# Patient Record
Sex: Male | Born: 2000 | Race: Black or African American | Hispanic: No | Marital: Single | Smoking: Never smoker
Health system: Southern US, Community
[De-identification: ages and names within clinical notes are randomized; demographics above are authoritative.]

## PROBLEM LIST (undated history)

## (undated) ENCOUNTER — Emergency Department (HOSPITAL_COMMUNITY): Payer: Medicaid Other

## (undated) DIAGNOSIS — D571 Sickle-cell disease without crisis: Secondary | ICD-10-CM

---
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
## 2020-01-21 ENCOUNTER — Inpatient Hospital Stay (HOSPITAL_COMMUNITY)
Admission: EM | Admit: 2020-01-21 | Discharge: 2020-01-23 | DRG: 812 | Disposition: A | Discharge status: Home / Self Care | Payer: Medicaid Other | Attending: Internal Medicine | Admitting: Internal Medicine

## 2020-01-21 ENCOUNTER — Emergency Department (HOSPITAL_COMMUNITY): Payer: Medicaid Other

## 2020-01-21 ENCOUNTER — Encounter (HOSPITAL_COMMUNITY): Payer: Self-pay | Admitting: Emergency Medicine

## 2020-01-21 ENCOUNTER — Other Ambulatory Visit: Payer: Self-pay

## 2020-01-21 DIAGNOSIS — Z833 Family history of diabetes mellitus: Secondary | ICD-10-CM | POA: Diagnosis not present

## 2020-01-21 DIAGNOSIS — Z20822 Contact with and (suspected) exposure to covid-19: Secondary | ICD-10-CM | POA: Diagnosis not present

## 2020-01-21 DIAGNOSIS — R0789 Other chest pain: Secondary | ICD-10-CM | POA: Diagnosis present

## 2020-01-21 DIAGNOSIS — D638 Anemia in other chronic diseases classified elsewhere: Secondary | ICD-10-CM | POA: Diagnosis present

## 2020-01-21 DIAGNOSIS — D5700 Hb-SS disease with crisis, unspecified: Principal | ICD-10-CM | POA: Diagnosis present

## 2020-01-21 DIAGNOSIS — Z79899 Other long term (current) drug therapy: Secondary | ICD-10-CM

## 2020-01-21 DIAGNOSIS — K2900 Acute gastritis without bleeding: Secondary | ICD-10-CM | POA: Diagnosis not present

## 2020-01-21 DIAGNOSIS — Z832 Family history of diseases of the blood and blood-forming organs and certain disorders involving the immune mechanism: Secondary | ICD-10-CM | POA: Diagnosis not present

## 2020-01-21 DIAGNOSIS — D72829 Elevated white blood cell count, unspecified: Secondary | ICD-10-CM | POA: Diagnosis not present

## 2020-01-21 DIAGNOSIS — R079 Chest pain, unspecified: Secondary | ICD-10-CM

## 2020-01-21 DIAGNOSIS — G894 Chronic pain syndrome: Secondary | ICD-10-CM | POA: Diagnosis present

## 2020-01-21 LAB — COMPREHENSIVE METABOLIC PANEL
ALT: 14 U/L (ref 0–44)
AST: 31 U/L (ref 15–41)
Albumin: 4.2 g/dL (ref 3.5–5.0)
Alkaline Phosphatase: 103 U/L (ref 38–126)
Anion gap: 9 (ref 5–15)
BUN: 16 mg/dL (ref 6–20)
CO2: 23 mmol/L (ref 22–32)
Calcium: 9 mg/dL (ref 8.9–10.3)
Chloride: 107 mmol/L (ref 98–111)
Creatinine, Ser: 0.83 mg/dL (ref 0.61–1.24)
GFR calc Af Amer: >60 mL/min (ref >60)
GFR calc non Af Amer: >60 mL/min (ref >60)
Glucose, Bld: 100 mg/dL — ABNORMAL HIGH (ref 70–99)
Potassium: 3.9 mmol/L (ref 3.5–5.1)
Sodium: 139 mmol/L (ref 135–145)
Total Bilirubin: 4.4 mg/dL — ABNORMAL HIGH (ref 0.3–1.2)
Total Protein: 7.7 g/dL (ref 6.5–8.1)

## 2020-01-21 LAB — CBC WITH DIFFERENTIAL/PLATELET
Abs Immature Granulocytes: 0.22 10*3/uL — ABNORMAL HIGH (ref 0.00–0.07)
Basophils Absolute: 0.2 10*3/uL — ABNORMAL HIGH (ref 0.0–0.1)
Basophils Relative: 1 %
Eosinophils Absolute: 0.2 10*3/uL (ref 0.0–0.5)
Eosinophils Relative: 1 %
HCT: 26.9 % — ABNORMAL LOW (ref 39.0–52.0)
Hemoglobin: 9.7 g/dL — ABNORMAL LOW (ref 13.0–17.0)
Immature Granulocytes: 1 %
Lymphocytes Relative: 18 %
Lymphs Abs: 2.9 10*3/uL (ref 0.7–4.0)
MCH: 32.1 pg (ref 26.0–34.0)
MCHC: 36.1 g/dL — ABNORMAL HIGH (ref 30.0–36.0)
MCV: 89.1 fL (ref 80.0–100.0)
Monocytes Absolute: 2.4 10*3/uL — ABNORMAL HIGH (ref 0.1–1.0)
Monocytes Relative: 15 %
Neutro Abs: 10 10*3/uL — ABNORMAL HIGH (ref 1.7–7.7)
Neutrophils Relative %: 64 %
Platelets: 472 10*3/uL — ABNORMAL HIGH (ref 150–400)
RBC: 3.02 MIL/uL — ABNORMAL LOW (ref 4.22–5.81)
RDW: 21.5 % — ABNORMAL HIGH (ref 11.5–15.5)
WBC: 15.8 10*3/uL — ABNORMAL HIGH (ref 4.0–10.5)
nRBC: 3.9 % — ABNORMAL HIGH (ref 0.0–0.2)

## 2020-01-21 LAB — RETICULOCYTES
Immature Retic Fract: 34.1 % — ABNORMAL HIGH (ref 2.3–15.9)
RBC.: 2.99 MIL/uL — ABNORMAL LOW (ref 4.22–5.81)
Retic Count, Absolute: 414.5 10*3/uL — ABNORMAL HIGH (ref 19.0–186.0)
Retic Ct Pct: 13.6 % — ABNORMAL HIGH (ref 0.4–3.1)

## 2020-01-21 LAB — LIPASE, BLOOD: Lipase: 26 U/L (ref 11–51)

## 2020-01-21 LAB — SARS CORONAVIRUS 2 BY RT PCR (HOSPITAL ORDER, PERFORMED IN ~~LOC~~ HOSPITAL LAB): SARS Coronavirus 2: NEGATIVE

## 2020-01-21 MED ORDER — POLYETHYLENE GLYCOL 3350 17 G PO PACK
17.0000 g | PACK | Freq: Every day | ORAL | Status: DC | PRN
  Administered 2020-01-23: 17 g via ORAL
  Filled 2020-01-21: qty 1

## 2020-01-21 MED ORDER — HYDROXYUREA 500 MG PO CAPS
1500.0000 mg | ORAL_CAPSULE | Freq: Every day | ORAL | Status: DC
  Administered 2020-01-21 – 2020-01-22 (×2): 1500 mg via ORAL
  Filled 2020-01-21 (×2): qty 3

## 2020-01-21 MED ORDER — ENOXAPARIN SODIUM 40 MG/0.4ML ~~LOC~~ SOLN
40.0000 mg | SUBCUTANEOUS | Status: DC
  Administered 2020-01-21: 40 mg via SUBCUTANEOUS
  Filled 2020-01-21 (×2): qty 0.4

## 2020-01-21 MED ORDER — KETOROLAC TROMETHAMINE 15 MG/ML IJ SOLN
15.0000 mg | INTRAMUSCULAR | Status: AC
  Administered 2020-01-21: 15 mg via INTRAVENOUS
  Filled 2020-01-21: qty 1

## 2020-01-21 MED ORDER — SENNOSIDES-DOCUSATE SODIUM 8.6-50 MG PO TABS
1.0000 | ORAL_TABLET | Freq: Two times a day (BID) | ORAL | Status: DC
  Administered 2020-01-21 – 2020-01-23 (×4): 1 via ORAL
  Filled 2020-01-21 (×4): qty 1

## 2020-01-21 MED ORDER — HYDROMORPHONE HCL 1 MG/ML IJ SOLN
1.0000 mg | Freq: Once | INTRAMUSCULAR | Status: DC
  Filled 2020-01-21: qty 1

## 2020-01-21 MED ORDER — SODIUM CHLORIDE 0.9% FLUSH: 9.0000 mL | INTRAVENOUS | Status: DC | PRN

## 2020-01-21 MED ORDER — HYDROMORPHONE HCL 1 MG/ML IJ SOLN
0.5000 mg | Freq: Once | INTRAMUSCULAR | Status: AC
  Administered 2020-01-21: 0.5 mg via INTRAVENOUS
  Filled 2020-01-21: qty 1

## 2020-01-21 MED ORDER — DEXTROSE-NACL 5-0.45 % IV SOLN: INTRAVENOUS | Status: DC

## 2020-01-21 MED ORDER — NALOXONE HCL 0.4 MG/ML IJ SOLN: 0.4000 mg | INTRAMUSCULAR | Status: DC | PRN

## 2020-01-21 MED ORDER — DIPHENHYDRAMINE HCL 25 MG PO CAPS: 25.0000 mg | ORAL_CAPSULE | ORAL | Status: DC | PRN

## 2020-01-21 MED ORDER — FOLIC ACID 1 MG PO TABS
1.0000 mg | ORAL_TABLET | Freq: Every day | ORAL | Status: DC
  Administered 2020-01-21 – 2020-01-22 (×2): 1 mg via ORAL
  Filled 2020-01-21 (×2): qty 1

## 2020-01-21 MED ORDER — OXYCODONE HCL 5 MG PO TABS
10.0000 mg | ORAL_TABLET | Freq: Four times a day (QID) | ORAL | Status: DC | PRN
  Administered 2020-01-21 – 2020-01-23 (×4): 10 mg via ORAL
  Filled 2020-01-21 (×4): qty 2

## 2020-01-21 MED ORDER — ONDANSETRON HCL 4 MG/2ML IJ SOLN
4.0000 mg | Freq: Four times a day (QID) | INTRAMUSCULAR | Status: DC | PRN
  Administered 2020-01-21 – 2020-01-22 (×3): 4 mg via INTRAVENOUS
  Filled 2020-01-21 (×3): qty 2

## 2020-01-21 MED ORDER — KETOROLAC TROMETHAMINE 15 MG/ML IJ SOLN
15.0000 mg | Freq: Four times a day (QID) | INTRAMUSCULAR | Status: DC
  Administered 2020-01-21 – 2020-01-23 (×7): 15 mg via INTRAVENOUS
  Filled 2020-01-21 (×7): qty 1

## 2020-01-21 MED ORDER — SODIUM CHLORIDE 0.9 % IV SOLN
25.0000 mg | INTRAVENOUS | Status: DC | PRN
  Filled 2020-01-21: qty 0.5

## 2020-01-21 MED ORDER — HYDROMORPHONE HCL 1 MG/ML IJ SOLN
1.0000 mg | Freq: Once | INTRAMUSCULAR | Status: AC
  Administered 2020-01-21: 1 mg via INTRAVENOUS
  Filled 2020-01-21: qty 1

## 2020-01-21 MED ORDER — HYDROMORPHONE HCL 1 MG/ML IJ SOLN
0.5000 mg | Freq: Once | INTRAMUSCULAR | Status: AC
  Administered 2020-01-21: 0.5 mg via INTRAVENOUS

## 2020-01-21 MED ORDER — SODIUM CHLORIDE 0.45 % IV SOLN: INTRAVENOUS | Status: DC

## 2020-01-21 MED ORDER — SODIUM CHLORIDE 0.9% FLUSH
3.0000 mL | Freq: Once | INTRAVENOUS | Status: AC
  Administered 2020-01-21: 3 mL via INTRAVENOUS

## 2020-01-21 MED ORDER — HYDROMORPHONE 1 MG/ML IV SOLN
INTRAVENOUS | Status: DC
  Administered 2020-01-21: 30 mg via INTRAVENOUS
  Administered 2020-01-21: 3 mg via INTRAVENOUS
  Administered 2020-01-22: 0.5 mg via INTRAVENOUS
  Administered 2020-01-22: 1 mg via INTRAVENOUS
  Administered 2020-01-22 (×2): 1.5 mg via INTRAVENOUS
  Administered 2020-01-22: 0 mg via INTRAVENOUS
  Administered 2020-01-22: 1 mg via INTRAVENOUS
  Administered 2020-01-23: 0.5 mg via INTRAVENOUS
  Administered 2020-01-23 (×2): 0 mg via INTRAVENOUS
  Filled 2020-01-21 (×2): qty 30

## 2020-01-21 NOTE — ED Provider Notes (Signed)
[facility]-EMERGENCY DEPT Provider Note   CSN: [id number] Arrival date & time: 01/21/20  9379     History Chief Complaint  Patient presents with  . Sickle Cell Pain Crisis  . Abdominal Pain  . chest pain    Matthew Frederick is a 19 y.o. male with pertinent past medical history of sickle cell  that presents to the emergency department today for sickle cell crisis.  Patient states that he normally has sickle cell crisis and upper and lower bilateral extremities, however states today that he has abdominal and chest pain with pain in his right upper extremity.  Patient states that he has had a couple episodes of sickle cell crisis where this has occurred.  Has not taken any of his medications this morning.  States that abdominal pain is generalized, states that it is sharp and constant.  States that chest pain is more in his epigastric region and is tender.  Denies any exertional component or radiation of chest pain.  Denies any back pain.  Denies any URI-like symptoms, fevers, chills, shortness of breath.  Patient states that he was in normal health yesterday.  Has been taking his medications as prescribed except for today.  States that he normally takes oxycodone for breakthrough pain, has not taken that today.  Denies any sick contacts.  Denies any paresthesias, weakness, headache, vision changes, nausea, vomiting, abnormal stools.  Patient states her last bowel movement was 2 days ago.  States that this is normal for him.  States that he ate breakfast this morning without any troubles.  HPI     Past Medical History:  Diagnosis Date  . Sickle cell anemia [facility])     Patient Active Problem List   Diagnosis Date Noted  . Anemia   . Sickle-cell crisis (HCC) 09/03/2019  . Sickle cell anemia with pain (HCC) 08/05/2019  . Abnormal liver function 04/02/2019  . Thrombocytopenia (HCC) 11/24/2018  . Leukocytosis 11/22/2018  . Chest pain   . Coccyx pain   . Sickle cell pain  crisis (HCC)   . Fever   . Sickle cell crisis (HCC) 07/23/2016  . Transition of care performed with sharing of clinical summary 04/28/2016  . Need for immunization against influenza 04/22/2012    History reviewed. No pertinent surgical history.     Family History  Problem Relation Age of Onset  . Sickle cell anemia Father   . Diabetes Maternal Grandmother     Social History   Tobacco Use  . Smoking status: Never Smoker  . Smokeless tobacco: Never Used  Vaping Use  . Vaping Use: Never used  Substance Use Topics  . Alcohol use: No  . Drug use: No    Home Medications Prior to Admission medications   Medication Sig Start Date End Date Taking? Authorizing Provider  folic acid (FOLVITE) 1 MG tablet Take 1 mg by mouth at bedtime.    Yes [provider]  hydroxyurea (HYDREA) 500 MG capsule Take 1,500 mg by mouth at bedtime. May take with food to minimize GI side effects.    Yes [provider]  ibuprofen (ADVIL,MOTRIN) 200 MG tablet Take 400 mg by mouth every 6 (six) hours as needed for moderate pain.   Yes [provider]  Oxycodone HCl 10 MG TABS Take 1 tablet (10 mg total) by mouth every 6 (six) hours as needed. 12/05/19  Yes Massie Maroon, FNP    Allergies    Patient has no known allergies.  Review  of Systems   Review of Systems  Constitutional: Negative for chills, diaphoresis, fatigue and fever.  HENT: Negative for congestion, sore throat and trouble swallowing.   Eyes: Negative for pain and visual disturbance.  Respiratory: Negative for cough, shortness of breath and wheezing.   Cardiovascular: Positive for chest pain. Negative for palpitations and leg swelling.  Gastrointestinal: Positive for abdominal pain. Negative for abdominal distention, diarrhea, nausea and vomiting.  Genitourinary: Negative for difficulty urinating.  Musculoskeletal: Positive for myalgias. Negative for back pain, neck pain and neck stiffness.  Skin: Negative for  pallor.  Neurological: Negative for dizziness, speech difficulty, weakness and headaches.  Psychiatric/Behavioral: Negative for confusion.    Physical Exam Updated Vital Signs BP 110/71 (BP Location: Right Arm)   Pulse 75   Temp 98.5 F (36.9 C) (Oral)   Resp 17   Ht 5\' 10"  (1.778 m)   Wt 59.9 kg   SpO2 99%   BMI 18.94 kg/m   Physical Exam Constitutional:      General: He is not in acute distress.    Appearance: Normal appearance. He is not ill-appearing, toxic-appearing or diaphoretic.     Comments: Patient does not appear comfortable in bed, cannot find comfortable position  HENT:     Mouth/Throat:     Mouth: Mucous membranes are moist.     Pharynx: Oropharynx is clear.  Eyes:     General: No scleral icterus.    Extraocular Movements: Extraocular movements intact.     Pupils: Pupils are equal, round, and reactive to light.  Cardiovascular:     Rate and Rhythm: Normal rate and regular rhythm.     Pulses: Normal pulses.     Heart sounds: Normal heart sounds.  Pulmonary:     Effort: Pulmonary effort is normal. No respiratory distress.     Breath sounds: Normal breath sounds. No stridor. No wheezing, rhonchi or rales.  Chest:     Chest wall: Tenderness present.  Abdominal:     General: Abdomen is flat. There is no distension.     Palpations: Abdomen is soft.     Tenderness: There is generalized abdominal tenderness. There is no right CVA tenderness, left CVA tenderness, guarding or rebound. Negative signs include Murphy's sign, Rovsing's sign, McBurney's sign, psoas sign and obturator sign.  Musculoskeletal:        General: No swelling or tenderness. Normal range of motion.     Cervical back: Normal range of motion and neck supple. No rigidity.     Right lower leg: No edema.     Left lower leg: No edema.     Comments: Tenderness to upper right extremity, is able to range arm in all directions.  No specific joint tenderness.  Normal sensation.  Skin:    General: Skin  is warm and dry.     Capillary Refill: Capillary refill takes less than 2 seconds.     Coloration: Skin is not pale.  Neurological:     General: No focal deficit present.     Mental Status: He is alert and oriented to person, place, and time. Mental status is at baseline.     Cranial Nerves: No cranial nerve deficit.     Sensory: No sensory deficit.     Motor: No weakness.     Coordination: Coordination normal.  Psychiatric:        Mood and Affect: Mood normal.        Behavior: Behavior normal.     ED Results / Procedures /  Treatments   Labs (all labs ordered are listed, but only abnormal results are displayed) Labs Reviewed  COMPREHENSIVE METABOLIC PANEL - Abnormal; Notable for the following components:      Result Value   Glucose, Bld 100 (*)    Total Bilirubin 4.4 (*)    All other components within normal limits  CBC WITH DIFFERENTIAL/PLATELET - Abnormal; Notable for the following components:   WBC 15.8 (*)    RBC 3.02 (*)    Hemoglobin 9.7 (*)    HCT 26.9 (*)    MCHC 36.1 (*)    RDW 21.5 (*)    Platelets 472 (*)    nRBC 3.9 (*)    Neutro Abs 10.0 (*)    Monocytes Absolute 2.4 (*)    Basophils Absolute 0.2 (*)    Abs Immature Granulocytes 0.22 (*)    All other components within normal limits  RETICULOCYTES - Abnormal; Notable for the following components:   Retic Ct Pct 13.6 (*)    RBC. 2.99 (*)    Retic Count, Absolute 414.5 (*)    Immature Retic Fract 34.1 (*)    All other components within normal limits  LIPASE, BLOOD    EKG EKG Interpretation  Date/Time:  Saturday January 21 2020 09:52:08 EDT Ventricular Rate:  88 PR Interval:    QRS Duration: 78 QT Interval:  331 QTC Calculation: 401 R Axis:   67 Text Interpretation: Sinus rhythm Confirmed by Tilden Fossa [phone]) on 01/21/2020 9:56:06 AM   Radiology DG Chest 2 View  Result Date: 01/21/2020 CLINICAL DATA:  Chest pain.  Sickle cell disease. EXAM: CHEST - 2 VIEW COMPARISON:  09/07/2019 FINDINGS: The  heart size and mediastinal contours are within normal limits. Both lungs are clear. Chronic skeletal changes of sickle cell disease again noted. IMPRESSION: No active cardiopulmonary disease. Electronically Signed   By: Danae Orleans M.D.   On: 01/21/2020 11:06    Procedures Procedures (including critical care time)  Medications Ordered in ED Medications  dextrose 5 %-0.45 % sodium chloride infusion ( Intravenous Rate/Dose Change 01/21/20 1514)  sodium chloride flush (NS) 0.9 % injection 3 mL (3 mLs Intravenous Given 01/21/20 1139)  HYDROmorphone (DILAUDID) injection 1 mg (1 mg Intravenous Given 01/21/20 1018)  ketorolac (TORADOL) 15 MG/ML injection 15 mg (15 mg Intravenous Given 01/21/20 1019)  HYDROmorphone (DILAUDID) injection 0.5 mg (0.5 mg Intravenous Given 01/21/20 1139)  HYDROmorphone (DILAUDID) injection 0.5 mg (0.5 mg Intravenous Given 01/21/20 1318)  HYDROmorphone (DILAUDID) injection 0.5 mg (0.5 mg Intravenous Given 01/21/20 1617)    ED Course  I have reviewed the triage vital signs and the nursing notes.  Pertinent labs & imaging results that were available during my care of the patient were reviewed by me and considered in my medical decision making (see chart for details).  Clinical Course as of Jan 20 1630  Sat Jan 21, 2020  1238 Total Bilirubin(!): 4.4 [SP]    Clinical Course User Index [SP] Farrel Gordon, PA-C   MDM Rules/Calculators/A&P                           Kele Barthelemy is a 19 y.o. male with pertinent past medical history of sickle cell  that presents to the emergency department today for sickle cell crisis.  Patient states that pain is in his chest and his abdomen.  Patient states that normal sickle cell pain crisis of the extremities, however has had crises where his pain is  in his chest and his abdomen.  Patient states he has never gotten  2mg  of Dilaudid, normally starts with 1 and then goes to .5mg .  will do this, patient has been admitted before for sickle  cell crisis.  Patient states that pain is a 10/10.  Patient does not take any chronic opioid medications daily.  Chest x-ray without any abnormalities.  CBC and CMP stable.  After recheck after 30 minutes, patient states that pain is now 8/10 after 1 mg of Dilaudid.  Appears more comfortable in bed, states that he needs more pain medication.  Will give another 0.5 of Dilaudid at this time.  After check again after 30 minutes, patient states that pain is 7/10 and wants another 0.5 mg of Dilaudid at this time. After recheck after an hour, patient states that he wants to be admitted.  We will try .5 mg more of Dilaudid at this time and consult hospitalist for admission.  Blood pressure is soft therefore not give 1mg .    Dr. Hyman HopesJegede, and patient sickle cell, to accept the patient.  The patient appears reasonably stabilized for admission considering the current resources, flow, and capabilities available in the ED at this time, and I doubt any other [facility] requiring further screening and/or treatment in the ED prior to admission.    Final Clinical Impression(s) / ED Diagnoses Final diagnoses:  Sickle cell crisis [facility](HCC)    Rx / DC Orders ED Discharge Orders    None       Farrel Gordonatel, Matteo Banke, PA-C 01/21/20 1631    Tilden Fossaees, Elizabeth, MD 01/22/20 [phone]

## 2020-01-21 NOTE — Progress Notes (Signed)
Attempted to reach ED nurse for report, will try again.

## 2020-01-21 NOTE — ED Notes (Signed)
Report attempted by Laymond Purser. IPRN unavailable.

## 2020-01-21 NOTE — ED Triage Notes (Signed)
Pt c/o abd and chest sickel cell pains that started this morning. Denies taking any medications.

## 2020-01-21 NOTE — H&P (Signed)
H&P  Patient Demographics:  Matthew Frederick, is a 19 y.o. male  MRN: [id number]   DOB - Oct 16, 2000  Admit Date - 01/21/2020  Outpatient Primary MD for the patient is [name], MD  Chief Complaint  Patient presents with  . Sickle Cell Pain Crisis  . Abdominal Pain  . chest pain     HPI:   Matthew Frederick  is a 19 y.o. male with a medical history significant for sickle cell disease, chronic pain syndrome and history of anemia of chronic disease who presented to the emergency room earlier this morning with major complaints of generalized body pain that is consistent with his usual sickle cell crisis.  He also complaining of generalized abdominal pain but mostly in the epigastrium which started recently, characterized as sharp and constant, going into his chest, not associated with exertion and no radiation.  He denies any back pain.  He denies any history of peptic ulcer disease.  He denies any trauma or fall.  He has nausea and vomited once, nonbilious nonbloody, continue recently ingested cracker in the emergency room.  Patient denies any sick contact, denies any URI-like symptoms, cough, denies any fever, chills or shortness of breath.  He denies any headache, blurry vision, joint swelling or redness.  ED course: His vital signs are: BP 110/71 (BP Location: Right Arm)   Pulse 75   Temp 98.5 F (36.9 C) (Oral)   Resp 17   Ht 5\' 10"  (1.778 m)   Wt 59.9 kg   SpO2 99%   BMI 18.94 kg/m  Patient was in no acute distress, appeared normal, nontoxic but feels uncomfortable in bed due to pain.  There was documented tenderness to upper right extremity but with normal range of motion's, no specific joint tenderness.  Chest x-ray done in the emergency room was normal.  White cell count showed slight elevation at 15.8, hemoglobin stable at baseline of 9.7, comprehensive metabolic panel was normal.  Patient was given multiple doses of Dilaudid in the emergency room with no sustained relief, patient  will be admitted for further evaluation and management of sickle cell pain crisis.   Review of systems:  In addition to the HPI above, patient reports No fever or chills No Headache, No changes with vision or hearing No problems swallowing food or liquids No chest pain, cough or shortness of breath No blood in stool or urine No dysuria  No new skin rashes or bruises No new joints pains-aches No new weakness, tingling, numbness in any extremity No recent weight gain or loss No polyuria, polydypsia or polyphagia No significant Mental Stressors  A full 10 point Review of Systems was done, except as stated above, all other Review of Systems were negative.  With Past History of the following :   Past Medical History:  Diagnosis Date  . Sickle cell anemia (HCC)       History reviewed. No pertinent surgical history.   Social History:   Social History   Tobacco Use  . Smoking status: Never Smoker  . Smokeless tobacco: Never Used  Substance Use Topics  . Alcohol use: No     Lives - At home   Family History :   Family History  Problem Relation Age of Onset  . Sickle cell anemia Father   . Diabetes Maternal Grandmother      Home Medications:   Prior to Admission medications   Medication Sig Start Date End Date Taking? Authorizing Provider  folic acid (FOLVITE) 1 MG  tablet Take 1 mg by mouth at bedtime.    Yes [provider]  hydroxyurea (HYDREA) 500 MG capsule Take 1,500 mg by mouth at bedtime. May take with food to minimize GI side effects.    Yes [provider]  ibuprofen (ADVIL,MOTRIN) 200 MG tablet Take 400 mg by mouth every 6 (six) hours as needed for moderate pain.   Yes [provider]  Oxycodone HCl 10 MG TABS Take 1 tablet (10 mg total) by mouth every 6 (six) hours as needed. 12/05/19  Yes Massie Maroon, FNP     Allergies:   No Known Allergies   Physical Exam:   Vitals:   Vitals:   01/21/20 1600 01/21/20 1607  BP:  101/63 110/71  Pulse: 66 75  Resp: 12 17  Temp:  98.5 F (36.9 C)  SpO2: 100% 99%    Physical Exam: Constitutional: Patient appears well-developed and well-nourished. Not in obvious distress. HENT: Normocephalic, atraumatic, External right and left ear normal. Oropharynx is clear and moist.  Eyes: Conjunctivae and EOM are normal. PERRLA, no scleral icterus. Neck: Normal ROM. Neck supple. No JVD. No tracheal deviation. No thyromegaly. CVS: RRR, S1/S2 +, no murmurs, no gallops, no carotid bruit.  Pulmonary: Effort and breath sounds normal, no stridor, rhonchi, wheezes, rales.  Abdominal: Soft. BS +, no distension, mild epigastric tenderness, no rebound or guarding.  Musculoskeletal: Normal range of motion. No edema and no tenderness.  Lymphadenopathy: No lymphadenopathy noted, cervical, inguinal or axillary Neuro: Alert. Normal reflexes, muscle tone coordination. No cranial nerve deficit. Skin: Skin is warm and dry. No rash noted. Not diaphoretic. No erythema. No pallor. Psychiatric: Normal mood and affect. Behavior, judgment, thought content normal.   Data Review:   CBC Recent Labs  Lab 01/21/20 1011  WBC 15.8*  HGB 9.7*  HCT 26.9*  PLT 472*  MCV 89.1  MCH 32.1  MCHC 36.1*  RDW 21.5*  LYMPHSABS 2.9  MONOABS 2.4*  EOSABS 0.2  BASOSABS 0.2*   ------------------------------------------------------------------------------------------------------------------  Chemistries  Recent Labs  Lab 01/21/20 1011  NA 139  K 3.9  CL 107  CO2 23  GLUCOSE 100*  BUN 16  CREATININE 0.83  CALCIUM 9.0  AST 31  ALT 14  ALKPHOS 103  BILITOT 4.4*   ------------------------------------------------------------------------------------------------------------------ estimated creatinine clearance is 121.3 mL/min (by C-G formula based on SCr of 0.83 mg/dL). ------------------------------------------------------------------------------------------------------------------ No results for  input(s): TSH, T4TOTAL, T3FREE, THYROIDAB in the last 72 hours.  Invalid input(s): FREET3  Coagulation profile No results for input(s): INR, PROTIME in the last 168 hours. ------------------------------------------------------------------------------------------------------------------- No results for input(s): DDIMER in the last 72 hours. -------------------------------------------------------------------------------------------------------------------  Cardiac Enzymes No results for input(s): CKMB, TROPONINI, MYOGLOBIN in the last 168 hours.  Invalid input(s): CK ------------------------------------------------------------------------------------------------------------------ No results found for: BNP  ---------------------------------------------------------------------------------------------------------------  Urinalysis    Component Value Date/Time   COLORURINE YELLOW 09/08/2019 2226   APPEARANCEUR CLEAR 09/08/2019 2226   LABSPEC 1.014 09/08/2019 2226   PHURINE 7.0 09/08/2019 2226   GLUCOSEU NEGATIVE 09/08/2019 2226   HGBUR NEGATIVE 09/08/2019 2226   BILIRUBINUR NEGATIVE 09/08/2019 2226   KETONESUR NEGATIVE 09/08/2019 2226   PROTEINUR NEGATIVE 09/08/2019 2226   NITRITE NEGATIVE 09/08/2019 2226   LEUKOCYTESUR NEGATIVE 09/08/2019 2226    ----------------------------------------------------------------------------------------------------------------   Imaging Results:    DG Chest 2 View  Result Date: 01/21/2020 CLINICAL DATA:  Chest pain.  Sickle cell disease. EXAM: CHEST - 2 VIEW COMPARISON:  09/07/2019 FINDINGS: The heart size and mediastinal contours are within normal limits.  Both lungs are clear. Chronic skeletal changes of sickle cell disease again noted. IMPRESSION: No active cardiopulmonary disease. Electronically Signed   By: Danae Orleans M.D.   On: 01/21/2020 11:06    Assessment & Plan:  Principal Problem:   Sickle cell anemia with crisis [facility]) Active  Problems:   Chest pain   Leukocytosis  1. Hb Sickle Cell Disease with crisis: Admit, start IVF .45% Saline @ 150 mls/hour, start weight based Dilaudid PCA at 0 point 5/10/3 started within 30 minutes of admission, start IV Toradol 15 mg Q 6 H, restart and continue home oral pain medication, monitor vitals very closely, Re-evaluate pain scale regularly, 2 L of Oxygen by [place], Patient will be re-evaluated for pain in the context of function and relationship to baseline as care progresses. 2. Leukocytosis: Most likely reactive, no evidence of inflammation or infection at this time.  We will monitor closely and manage without antibiotics at this time. 3. Sickle Cell Anemia: Hemoglobin is stable at baseline, no clinical indication for blood transfusion today. 4. Chronic pain Syndrome: Restart continue home pain medications. 5. Epigastric pain: Most likely due to gastritis. Will manage pain, patient may need PPI if pain persists beyond tomorrow.  DVT Prophylaxis: Subcut Lovenox   AM Labs Ordered, also please review Full Orders  Family Communication: Admission, patient's condition and plan of care including tests being ordered have been discussed with the patient who indicate understanding and agree with the plan and Code Status.  Code Status: Full Code  Consults called: None    Admission status: Inpatient    Time spent in minutes : 50 minutes  Jeanann Lewandowsky MD, MHA, CPE, FACP 01/21/2020 at 4:37 PM

## 2020-01-21 NOTE — Progress Notes (Signed)
Received report from Katie RN.

## 2020-01-22 DIAGNOSIS — K2900 Acute gastritis without bleeding: Secondary | ICD-10-CM

## 2020-01-22 LAB — CBC WITH DIFFERENTIAL/PLATELET
Basophils Absolute: 0 10*3/uL (ref 0.0–0.1)
Basophils Relative: 0 %
Eosinophils Absolute: 0.1 10*3/uL (ref 0.0–0.5)
Eosinophils Relative: 1 %
HCT: 24.4 % — ABNORMAL LOW (ref 39.0–52.0)
Hemoglobin: 8.9 g/dL — ABNORMAL LOW (ref 13.0–17.0)
Immature Granulocytes: 1 %
Lymphocytes Relative: 26 %
Lymphs Abs: 2.7 10*3/uL (ref 0.7–4.0)
MCH: 33 pg (ref 26.0–34.0)
MCHC: 36.5 g/dL — ABNORMAL HIGH (ref 30.0–36.0)
MCV: 90.4 fL (ref 80.0–100.0)
Monocytes Absolute: 1.5 10*3/uL — ABNORMAL HIGH (ref 0.1–1.0)
Monocytes Relative: 14 %
Neutro Abs: 6.2 10*3/uL (ref 1.7–7.7)
Neutrophils Relative %: 59 %
Platelets: 369 10*3/uL (ref 150–400)
RBC: 2.7 MIL/uL — ABNORMAL LOW (ref 4.22–5.81)
RDW: 20.6 % — ABNORMAL HIGH (ref 11.5–15.5)
WBC: 10.5 10*3/uL (ref 4.0–10.5)
nRBC: 4.8 % — ABNORMAL HIGH (ref 0.0–0.2)

## 2020-01-22 MED ORDER — PANTOPRAZOLE SODIUM 40 MG PO TBEC
40.0000 mg | DELAYED_RELEASE_TABLET | Freq: Every day | ORAL | Status: DC
  Administered 2020-01-22 – 2020-01-23 (×2): 40 mg via ORAL
  Filled 2020-01-22 (×2): qty 1

## 2020-01-22 NOTE — Progress Notes (Signed)
Patient ID: Matthew Frederick, male   DOB: 2001/05/11, 19 y.o.   MRN: [id number] Subjective: Matthew Frederick is an 19 year old male with a medical history significant for sickle cell disease, chronic pain syndrome, and anemia of chronic disease was admitted for sickle cell pain crisis.  Patient feels much better this morning, pain is between 3 and 5/10. Although, patient has not been able to eat, he had to episodes of emesis yesterday, still have epigastric pain.  He denies any fever, headache, chest pain, dizziness, urinary symptoms, constipation or diarrhea. Nausea and vomiting seems to be improved today.  Objective:  Vital signs in last 24 hours:  Vitals:   01/22/20 0730 01/22/20 1023 01/22/20 1146 01/22/20 1150  BP:  (!) 104/58    Pulse:  (!) 59    Resp: 18 12 10 11   Temp:  98.2 F (36.8 C)    TempSrc:  Oral    SpO2: 97% 100% 100% 100%  Weight:      Height:        Intake/Output from previous day:   Intake/Output Summary (Last 24 hours) at 01/22/2020 1234 Last data filed at 01/22/2020 1025 Gross per 24 hour  Intake 990 ml  Output 1025 ml  Net -35 ml    Physical Exam: General: Alert, awake, oriented x3, in no acute distress.  HEENT: [place]/AT PEERL, EOMI Neck: Trachea midline,  no masses, no thyromegal,y no JVD, no carotid bruit OROPHARYNX:  Moist, No exudate/ erythema/lesions.  Heart: Regular rate and rhythm, without murmurs, rubs, gallops, PMI non-displaced, no heaves or thrills on palpation.  Lungs: Clear to auscultation, no wheezing or rhonchi noted. No increased vocal fremitus resonant to percussion  Abdomen: Soft, mild epigastric tenderness, nondistended, positive bowel sounds, no masses no hepatosplenomegaly noted..  Neuro: No focal neurological deficits noted cranial nerves II through XII grossly intact. DTRs 2+ bilaterally upper and lower extremities. Strength 5 out of 5 in bilateral upper and lower extremities. Musculoskeletal: No warm swelling or erythema around joints,  no spinal tenderness noted. Psychiatric: Patient alert and oriented x3, good insight and cognition, good recent to remote recall. Lymph node survey: No cervical axillary or inguinal lymphadenopathy noted.  Lab Results:  Basic Metabolic Panel:    Component Value Date/Time   NA 139 01/21/2020 1011   K 3.9 01/21/2020 1011   CL 107 01/21/2020 1011   CO2 23 01/21/2020 1011   BUN 16 01/21/2020 1011   CREATININE 0.83 01/21/2020 1011   GLUCOSE 100 (H) 01/21/2020 1011   CALCIUM 9.0 01/21/2020 1011   CBC:    Component Value Date/Time   WBC 10.5 01/22/2020 0616   HGB 8.9 (L) 01/22/2020 0616   HCT 24.4 (L) 01/22/2020 0616   PLT 369 01/22/2020 0616   MCV 90.4 01/22/2020 0616   NEUTROABS 6.2 01/22/2020 0616   LYMPHSABS 2.7 01/22/2020 0616   MONOABS 1.5 (H) 01/22/2020 0616   EOSABS 0.1 01/22/2020 0616   BASOSABS 0.0 01/22/2020 0616    Recent Results (from the past 240 hour(s))  SARS Coronavirus 2 by RT PCR (hospital order, performed in [facility] hospital lab) Nasopharyngeal Nasopharyngeal Swab     Status: None   Collection Time: 01/21/20  4:43 PM   Specimen: Nasopharyngeal Swab  Result Value Ref Range Status   SARS Coronavirus 2 NEGATIVE NEGATIVE Final    Comment: (NOTE) SARS-CoV-2 target nucleic acids are NOT DETECTED.  The SARS-CoV-2 RNA is generally detectable in upper and lower respiratory specimens during the acute phase of infection. The  lowest concentration of SARS-CoV-2 viral copies this assay can detect is 250 copies / mL. A negative result does not preclude SARS-CoV-2 infection and should not be used as the sole basis for treatment or other patient management decisions.  A negative result may occur with improper specimen collection / handling, submission of specimen other than nasopharyngeal swab, presence of viral mutation(s) within the areas targeted by this assay, and inadequate number of viral copies (<250 copies / mL). A negative result must be combined with  clinical observations, patient history, and epidemiological information.  Fact Sheet for Patients:   BoilerBrush.com.cy  Fact Sheet for Healthcare Providers: https://pope.com/  This test is not yet approved or  cleared by the [place] FDA and has been authorized for detection and/or diagnosis of SARS-CoV-2 by FDA under an Emergency Use Authorization (EUA).  This EUA will remain in effect (meaning this test can be used) for the duration of the COVID-19 declaration under Section 564(b)(1) of the Act, 21 U.S.C. section 360bbb-3(b)(1), unless the authorization is terminated or revoked sooner.  Performed at [facility], [address], [place], [place] [address]     Studies/Results: DG Chest 2 View  Result Date: 01/21/2020 CLINICAL DATA:  Chest pain.  Sickle cell disease. EXAM: CHEST - 2 VIEW COMPARISON:  09/07/2019 FINDINGS: The heart size and mediastinal contours are within normal limits. Both lungs are clear. Chronic skeletal changes of sickle cell disease again noted. IMPRESSION: No active cardiopulmonary disease. Electronically Signed   By: Danae Orleans M.D.   On: 01/21/2020 11:06    Medications: Scheduled Meds: . enoxaparin (LOVENOX) injection  40 mg Subcutaneous Q24H  . folic acid  1 mg Oral QHS  . HYDROmorphone   Intravenous Q4H  . hydroxyurea  1,500 mg Oral QHS  . ketorolac  15 mg Intravenous Q6H  . senna-docusate  1 tablet Oral BID   Continuous Infusions: . sodium chloride 150 mL/hr at 01/22/20 0825  . diphenhydrAMINE     PRN Meds:.diphenhydrAMINE **OR** diphenhydrAMINE, naloxone **AND** sodium chloride flush, ondansetron (ZOFRAN) IV, oxyCODONE, polyethylene glycol  Consultants:  None  Procedures:  None  Antibiotics:  None  Assessment/Plan: Principal Problem:   Sickle cell anemia with crisis (HCC) Active Problems:   Chest pain   Leukocytosis  1. Hb Sickle Cell Disease with  crisis: Reduce IVF to 75 mls/hour, encourage patient to have p.o. intake, continue weight based Dilaudid PCA at current setting, continue IV Toradol 15 mg Q 6 H for total of 5 days, continue oral home pain medications as ordered, monitor vitals very closely, Re-evaluate pain scale regularly, 2 L of Oxygen by [place]. 2. Leukocytosis: Most likely reactive.  We will continue to monitor and manage patient without antibiotics.  Repeat labs in a.m. 3. Sickle Cell Anemia: Hemoglobin is stable at baseline. No clinical indication for blood transfusion today. Repeat labs in a.m. 4. Chronic pain Syndrome: Restart and continue home pain medication. 5. Acute gastritis: We will add PPI. Patient counseled about pain.  Code Status: Full Code Family Communication: N/A Disposition Plan: Not yet ready for discharge  Ivanka Kirshner  If 7PM-7AM, please contact night-coverage.  01/22/2020, 12:34 PM  LOS: 1 day

## 2020-01-23 DIAGNOSIS — D5700 Hb-SS disease with crisis, unspecified: Secondary | ICD-10-CM | POA: Diagnosis not present

## 2020-01-23 LAB — CBC WITH DIFFERENTIAL/PLATELET
Abs Immature Granulocytes: 0.07 10*3/uL (ref 0.00–0.07)
Basophils Absolute: 0 10*3/uL (ref 0.0–0.1)
Basophils Relative: 0 %
Eosinophils Absolute: 0.1 10*3/uL (ref 0.0–0.5)
Eosinophils Relative: 1 %
HCT: 24.3 % — ABNORMAL LOW (ref 39.0–52.0)
Hemoglobin: 8.7 g/dL — ABNORMAL LOW (ref 13.0–17.0)
Immature Granulocytes: 1 %
Lymphocytes Relative: 8 %
Lymphs Abs: 1 10*3/uL (ref 0.7–4.0)
MCH: 32.2 pg (ref 26.0–34.0)
MCHC: 35.8 g/dL (ref 30.0–36.0)
MCV: 90 fL (ref 80.0–100.0)
Monocytes Absolute: 1.5 10*3/uL — ABNORMAL HIGH (ref 0.1–1.0)
Monocytes Relative: 11 %
Neutro Abs: 11.2 10*3/uL — ABNORMAL HIGH (ref 1.7–7.7)
Neutrophils Relative %: 79 %
Platelets: 386 10*3/uL (ref 150–400)
RBC: 2.7 MIL/uL — ABNORMAL LOW (ref 4.22–5.81)
RDW: 18.8 % — ABNORMAL HIGH (ref 11.5–15.5)
WBC: 13.9 10*3/uL — ABNORMAL HIGH (ref 4.0–10.5)
nRBC: 2.2 % — ABNORMAL HIGH (ref 0.0–0.2)

## 2020-01-23 LAB — COMPREHENSIVE METABOLIC PANEL
ALT: 14 U/L (ref 0–44)
AST: 19 U/L (ref 15–41)
Albumin: 3.6 g/dL (ref 3.5–5.0)
Alkaline Phosphatase: 143 U/L — ABNORMAL HIGH (ref 38–126)
Anion gap: 7 (ref 5–15)
BUN: 8 mg/dL (ref 6–20)
CO2: 27 mmol/L (ref 22–32)
Calcium: 8.4 mg/dL — ABNORMAL LOW (ref 8.9–10.3)
Chloride: 102 mmol/L (ref 98–111)
Creatinine, Ser: 0.77 mg/dL (ref 0.61–1.24)
GFR calc Af Amer: >60 mL/min (ref >60)
GFR calc non Af Amer: >60 mL/min (ref >60)
Glucose, Bld: 96 mg/dL (ref 70–99)
Potassium: 3.8 mmol/L (ref 3.5–5.1)
Sodium: 136 mmol/L (ref 135–145)
Total Bilirubin: 4.3 mg/dL — ABNORMAL HIGH (ref 0.3–1.2)
Total Protein: 6.6 g/dL (ref 6.5–8.1)

## 2020-01-23 NOTE — Discharge Instructions (Signed)
Sickle Cell Anemia, Adult  Sickle cell anemia is a condition where your red blood cells are shaped like sickles. Red blood cells carry oxygen through the body. Sickle-shaped cells do not live as long as normal red blood cells. They also clump together and block blood from flowing through the blood vessels. This prevents the body from getting enough oxygen. Sickle cell anemia causes organ damage and pain. It also increases the risk of infection. Follow these instructions at home: Medicines  Take over-the-counter and prescription medicines only as told by your doctor.  If you were prescribed an antibiotic medicine, take it as told by your doctor. Do not stop taking the antibiotic even if you start to feel better.  If you develop a fever, do not take medicines to lower the fever right away. Tell your doctor about the fever. Managing pain, stiffness, and swelling  Try these methods to help with pain: ? Use a heating pad. ? Take a warm bath. ? Distract yourself, such as by watching TV. Eating and drinking  Drink enough fluid to keep your pee (urine) clear or pale yellow. Drink more in hot weather and during exercise.  Limit or avoid alcohol.  Eat a healthy diet. Eat plenty of fruits, vegetables, whole grains, and lean protein.  Take vitamins and supplements as told by your doctor. Traveling  When traveling, keep these with you: ? Your medical information. ? The names of your doctors. ? Your medicines.  If you need to take an airplane, talk to your doctor first. Activity  Rest often.  Avoid exercises that make your heart beat much faster, such as jogging. General instructions  Do not use products that have nicotine or tobacco, such as cigarettes and e-cigarettes. If you need help quitting, ask your doctor.  Consider wearing a medical alert bracelet.  Avoid being in high places (high altitudes), such as mountains.  Avoid very hot or cold temperatures.  Avoid places where the  temperature changes a lot.  Keep all follow-up visits as told by your doctor. This is important. Contact a doctor if:  A joint hurts.  Your feet or hands hurt or swell.  You feel tired (fatigued). Get help right away if:  You have symptoms of infection. These include: ? Fever. ? Chills. ? Being very tired. ? Irritability. ? Poor eating. ? Throwing up (vomiting).  You feel dizzy or faint.  You have new stomach pain, especially on the left side.  You have a an erection (priapism) that lasts more than 4 hours.  You have numbness in your arms or legs.  You have a hard time moving your arms or legs.  You have trouble talking.  You have pain that does not go away when you take medicine.  You are short of breath.  You are breathing fast.  You have a long-term cough.  You have pain in your chest.  You have a bad headache.  You have a stiff neck.  Your stomach looks bloated even though you did not eat much.  Your skin is pale.  You suddenly cannot see well. Summary  Sickle cell anemia is a condition where your red blood cells are shaped like sickles.  Follow your doctor's advice on ways to manage pain, food to eat, activities to do, and steps to take for safe travel.  Get medical help right away if you have any signs of infection, such as a fever. This information is not intended to replace advice given to you by   your health care provider. Make sure you discuss any questions you have with your health care provider. Document Revised: 10/22/2018 Document Reviewed: 08/05/2016 Elsevier Patient Education  2020 [employer]  

## 2020-01-24 NOTE — Discharge Summary (Signed)
Physician Discharge Summary  [name] VWP:[id number] DOB: [birth year] DOA: 01/21/2020  PCP: [name], MD  Admit date: 01/21/2020  Discharge date: 01/24/2020  Discharge Diagnoses:  Principal Problem:   Sickle cell anemia with crisis [facility]) Active Problems:   Chest pain   Leukocytosis   Discharge Condition: Stable  Disposition:   Follow-up Information    [name], MD Follow up in 1 week(s).   Specialty: Family Medicine Contact information: [address] [place] [address] [phone]              Pt is discharged home in good condition and is to follow up with [name], MD this week to have labs evaluated. [name] is instructed to increase activity slowly and balance with rest for the next few days, and use prescribed medication to complete treatment of pain  Diet: Regular Wt Readings from Last 3 Encounters:  01/23/20 60.8 kg (18 %, Z= -0.91)*  12/04/19 58.1 kg (11 %, Z= -1.22)*  09/09/19 60.9 kg (20 %, Z= -0.83)*   * Growth percentiles are based on CDC (Boys, 2-20 Years) data.    History of present illness:  Matthew Frederick is a 19 year old male with a medical history significant for sickle cell disease, chronic pain syndrome, and anemia of chronic disease who presents to the emergency room this a.m. with major complaints of generalized body pain that is consistent with his usual sickle cell crisis.  He also complains of generalized abdominal pain but mostly in the epigastrium, which started recently that is characterized as sharp and constant, going to his chest, not associated with exertion and no radiation.  He denies any back pain.  He denies any history of peptic ulcer disease.  He denies any trauma or falls.  He has nausea and has vomited once, nonbilious nonbloody.  Patient denies any sick contacts, upper respiratory symptoms, cough, fever, chills, or shortness of breath.  He also denies any headache, blurry vision, joint swelling, or  redness.  ED course: His vital signs are: BP 110/71, pulse 75, temperature 98.5 F, respiration 17.  Patient was in no acute distress, appears normal, nontoxic but feels uncomfortable in bed due to pain.  There was documented tenderness to upper right extremity but with normal range of motion, no specific joint tenderness.  Chest x-ray done in ER was normal.  White cell count shows slight elevation at 15.8, hemoglobin stable at baseline of 9.7, comprehensive metabolic panel was normal.  Patient was given multiple doses of Dilaudid in the ER with no sustained relief, he will be admitted for further evaluation and management of sickle cell pain crisis.  Hospital Course:  Sickle cell disease with pain crisis: Patient was admitted for sickle cell pain crisis and managed appropriately with IVF, IV Dilaudid via PCA and IV Toradol, as well as other adjunct therapies per sickle cell pain management protocols. IV Dilaudid PCA was weaned appropriately.  Patient transition to home medications.  His pain intensity is 2/10.  He feels that he can manage at home on current medication regimen.  He has home pain medications.  He was advised to follow-up with PCP for medication management and to repeat CBC with differential and complete metabolic panel in 1 week. Patient is alert, oriented, and ambulating without assistance.  He is afebrile and oxygen saturation is 98% on RA. Patient was discharged home today in a hemodynamically stable condition.   Discharge Exam: Vitals:   01/23/20 1150 01/23/20 1350  BP:  Pulse:    Resp: 15   Temp:    SpO2: 98% 95%   Vitals:   01/23/20 0901 01/23/20 1007 01/23/20 1150 01/23/20 1350  BP: (!) 102/57 102/61    Pulse: 63 72    Resp: 16 14 15    Temp: 98.3 F (36.8 C) 98.2 F (36.8 C)    TempSrc: Oral Oral    SpO2: 99% 100% 98% 95%  Weight:      Height:        General appearance : Awake, alert, not in any distress. Speech Clear. Not toxic looking HEENT: Atraumatic  and Normocephalic, pupils equally reactive to light and accomodation Neck: Supple, no JVD. No cervical lymphadenopathy.  Chest: Good air entry bilaterally, no added sounds  CVS: S1 S2 regular, no murmurs.  Abdomen: Bowel sounds present, Non tender and not distended with no gaurding, rigidity or rebound. Extremities: B/L Lower Ext shows no edema, both legs are warm to touch Neurology: Awake alert, and oriented X 3, CN II-XII intact, Non focal Skin: No Rash  Discharge Instructions  Discharge Instructions    Discharge patient   Complete by: As directed    Discharge disposition: 01-Home or Self Care   Discharge patient date: 01/23/2020     Allergies as of 01/23/2020   No Known Allergies     Medication List    TAKE these medications   folic acid 1 MG tablet Commonly known as: FOLVITE Take 1 mg by mouth at bedtime.   hydroxyurea 500 MG capsule Commonly known as: HYDREA Take 1,500 mg by mouth at bedtime. May take with food to minimize GI side effects.   ibuprofen 200 MG tablet Commonly known as: ADVIL Take 400 mg by mouth every 6 (six) hours as needed for moderate pain.   Oxycodone HCl 10 MG Tabs Take 1 tablet (10 mg total) by mouth every 6 (six) hours as needed.       The results of significant diagnostics from this hospitalization (including imaging, microbiology, ancillary and laboratory) are listed below for reference.    Significant Diagnostic Studies: DG Chest 2 View  Result Date: 01/21/2020 CLINICAL DATA:  Chest pain.  Sickle cell disease. EXAM: CHEST - 2 VIEW COMPARISON:  09/07/2019 FINDINGS: The heart size and mediastinal contours are within normal limits. Both lungs are clear. Chronic skeletal changes of sickle cell disease again noted. IMPRESSION: No active cardiopulmonary disease. Electronically Signed   By: 09/09/2019 M.D.   On: 01/21/2020 11:06    Microbiology: Recent Results (from the past 240 hour(s))  SARS Coronavirus 2 by RT PCR (hospital order,  performed in [facility] hospital lab) Nasopharyngeal Nasopharyngeal Swab     Status: None   Collection Time: 01/21/20  4:43 PM   Specimen: Nasopharyngeal Swab  Result Value Ref Range Status   SARS Coronavirus 2 NEGATIVE NEGATIVE Final    Comment: (NOTE) SARS-CoV-2 target nucleic acids are NOT DETECTED.  The SARS-CoV-2 RNA is generally detectable in upper and lower respiratory specimens during the acute phase of infection. The lowest concentration of SARS-CoV-2 viral copies this assay can detect is 250 copies / mL. A negative result does not preclude SARS-CoV-2 infection and should not be used as the sole basis for treatment or other patient management decisions.  A negative result may occur with improper specimen collection / handling, submission of specimen other than nasopharyngeal swab, presence of viral mutation(s) within the areas targeted by this assay, and inadequate number of viral copies (<250 copies / mL). A negative  result must be combined with clinical observations, patient history, and epidemiological information.  Fact Sheet for Patients:   BoilerBrush.com.cy  Fact Sheet for Healthcare Providers: https://pope.com/  This test is not yet approved or  cleared by the [place] FDA and has been authorized for detection and/or diagnosis of SARS-CoV-2 by FDA under an Emergency Use Authorization (EUA).  This EUA will remain in effect (meaning this test can be used) for the duration of the COVID-19 declaration under Section 564(b)(1) of the Act, 21 U.S.C. section 360bbb-3(b)(1), unless the authorization is terminated or revoked sooner.  Performed at [facility], [address] [place], [place] [address]      Labs: Basic Metabolic Panel: Recent Labs  Lab 01/21/20 1011 01/23/20 0614  NA 139 136  K 3.9 3.8  CL 107 102  CO2 23 27  GLUCOSE 100* 96  BUN 16 8  CREATININE 0.83 0.77  CALCIUM 9.0  8.4*   Liver Function Tests: Recent Labs  Lab 01/21/20 1011 01/23/20 0614  AST 31 19  ALT 14 14  ALKPHOS 103 143*  BILITOT 4.4* 4.3*  PROT 7.7 6.6  ALBUMIN 4.2 3.6   Recent Labs  Lab 01/21/20 1011  LIPASE 26   No results for input(s): AMMONIA in the last 168 hours. CBC: Recent Labs  Lab 01/21/20 1011 01/22/20 0616 01/23/20 0614  WBC 15.8* 10.5 13.9*  NEUTROABS 10.0* 6.2 11.2*  HGB 9.7* 8.9* 8.7*  HCT 26.9* 24.4* 24.3*  MCV 89.1 90.4 90.0  PLT 472* 369 386   Cardiac Enzymes: No results for input(s): CKTOTAL, CKMB, CKMBINDEX, TROPONINI in the last 168 hours. BNP: Invalid input(s): POCBNP CBG: No results for input(s): GLUCAP in the last 168 hours.  Time coordinating discharge: 40 minutes  Signed:  [name]  APRN, MSN, FNP-C Patient Care [facility] [address] [place], [place] [address] [phone] [facility] 01/24/2020, 10:30 PM

## 2020-01-25 ENCOUNTER — Telehealth: Payer: Self-pay | Admitting: *Deleted

## 2020-01-25 DIAGNOSIS — D57219 Sickle-cell/Hb-C disease with crisis, unspecified: Secondary | ICD-10-CM

## 2020-01-25 NOTE — Telephone Encounter (Signed)
Attempted to contact pt to complete transition of care assessment; left message on voicemail; order placed for community care coordination.  Katrice Nezar Buckles, RN, BSN, CCRN [facility] [phone]  

## 2020-01-30 DIAGNOSIS — D571 Sickle-cell disease without crisis: Secondary | ICD-10-CM | POA: Diagnosis not present

## 2020-01-31 ENCOUNTER — Other Ambulatory Visit: Payer: Self-pay

## 2020-01-31 NOTE — Patient Outreach (Signed)
[facility]) Care Management  01/31/2020  Matthew Frederick 30-Oct-2000 [id number]   Managed Medicaid: New referral.  Recent admission for sickle cell crisis.  Placed call both contact numbers for patient with no answer. Left messages requesting a call back.  PLAN: will await a call back or will attempt to reach patient again in 3 days. Will mail an unsuccessful outreach letter.  [name], RN, BSN, CEN [facility] [employer] [phone]

## 2020-02-03 ENCOUNTER — Other Ambulatory Visit: Payer: Self-pay

## 2020-02-03 NOTE — Patient Outreach (Signed)
[facility]) Care Management  02/03/2020  Matthew Frederick [birth year] [id number]   Telephone assessment: Placed 2nd outreach call to patient. No answer. Left a message requesting a call back.  PLAN: will outreach attempt again in 3 days. Letter already mailed.  [name], RN, BSN, CEN [facility] [employer] [phone]

## 2020-02-08 ENCOUNTER — Other Ambulatory Visit: Payer: Self-pay

## 2020-02-08 NOTE — Patient Outreach (Signed)
[facility]) Care Management  02/08/2020  Matthew Frederick [birth year] [id number]    Telephone assessment:  3rd outreach call to patient today with no answer. Left a message requesting a call back.  PLAN: will plan case closure on 02/14/2020 if no response to telephone or letter outreach attempts.  Matthew Pavy, RN, BSN, CEN [facility] [employer] [phone]

## 2020-02-14 ENCOUNTER — Other Ambulatory Visit: Payer: Self-pay

## 2020-02-14 NOTE — Patient Outreach (Signed)
Care Coordination  02/14/2020  Matthew Frederick 2000-11-01 [id number]   Case closure:  No response to telephone or letter outreaches.  PLAN: close case as no response.  [name], RN, BSN, CEN [facility] [employer] [phone]

## 2020-02-17 ENCOUNTER — Encounter (HOSPITAL_COMMUNITY): Payer: Self-pay

## 2020-02-17 ENCOUNTER — Emergency Department (HOSPITAL_COMMUNITY): Payer: Medicaid Other

## 2020-02-17 ENCOUNTER — Emergency Department (HOSPITAL_COMMUNITY)
Admission: EM | Admit: 2020-02-17 | Discharge: 2020-02-17 | Disposition: A | Discharge status: Home / Self Care | Payer: Medicaid Other | Source: Home / Self Care | Attending: Emergency Medicine | Admitting: Emergency Medicine

## 2020-02-17 ENCOUNTER — Other Ambulatory Visit: Payer: Self-pay

## 2020-02-17 DIAGNOSIS — D5700 Hb-SS disease with crisis, unspecified: Secondary | ICD-10-CM

## 2020-02-17 DIAGNOSIS — F450 Somatization disorder: Secondary | ICD-10-CM | POA: Insufficient documentation

## 2020-02-17 DIAGNOSIS — Z79899 Other long term (current) drug therapy: Secondary | ICD-10-CM | POA: Insufficient documentation

## 2020-02-17 DIAGNOSIS — D571 Sickle-cell disease without crisis: Secondary | ICD-10-CM | POA: Diagnosis not present

## 2020-02-17 LAB — COMPREHENSIVE METABOLIC PANEL
ALT: 19 U/L (ref 0–44)
AST: 40 U/L (ref 15–41)
Albumin: 4.7 g/dL (ref 3.5–5.0)
Alkaline Phosphatase: 102 U/L (ref 38–126)
Anion gap: 9 (ref 5–15)
BUN: 12 mg/dL (ref 6–20)
CO2: 23 mmol/L (ref 22–32)
Calcium: 9.3 mg/dL (ref 8.9–10.3)
Chloride: 103 mmol/L (ref 98–111)
Creatinine, Ser: 0.76 mg/dL (ref 0.61–1.24)
GFR calc Af Amer: >60 mL/min (ref >60)
GFR calc non Af Amer: >60 mL/min (ref >60)
Glucose, Bld: 135 mg/dL — ABNORMAL HIGH (ref 70–99)
Potassium: 4.2 mmol/L (ref 3.5–5.1)
Sodium: 135 mmol/L (ref 135–145)
Total Bilirubin: 4.4 mg/dL — ABNORMAL HIGH (ref 0.3–1.2)
Total Protein: 8.3 g/dL — ABNORMAL HIGH (ref 6.5–8.1)

## 2020-02-17 LAB — CBC WITH DIFFERENTIAL/PLATELET
Abs Immature Granulocytes: 0.13 10*3/uL — ABNORMAL HIGH (ref 0.00–0.07)
Basophils Absolute: 0.1 10*3/uL (ref 0.0–0.1)
Basophils Relative: 1 %
Eosinophils Absolute: 0 10*3/uL (ref 0.0–0.5)
Eosinophils Relative: 0 %
HCT: 26.5 % — ABNORMAL LOW (ref 39.0–52.0)
Hemoglobin: 9.8 g/dL — ABNORMAL LOW (ref 13.0–17.0)
Immature Granulocytes: 1 %
Lymphocytes Relative: 8 %
Lymphs Abs: 1.4 10*3/uL (ref 0.7–4.0)
MCH: 32.9 pg (ref 26.0–34.0)
MCHC: 37 g/dL — ABNORMAL HIGH (ref 30.0–36.0)
MCV: 88.9 fL (ref 80.0–100.0)
Monocytes Absolute: 2 10*3/uL — ABNORMAL HIGH (ref 0.1–1.0)
Monocytes Relative: 11 %
Neutro Abs: 13.5 10*3/uL — ABNORMAL HIGH (ref 1.7–7.7)
Neutrophils Relative %: 79 %
Platelets: 408 10*3/uL — ABNORMAL HIGH (ref 150–400)
RBC: 2.98 MIL/uL — ABNORMAL LOW (ref 4.22–5.81)
RDW: 21.3 % — ABNORMAL HIGH (ref 11.5–15.5)
WBC: 17 10*3/uL — ABNORMAL HIGH (ref 4.0–10.5)
nRBC: 2.3 % — ABNORMAL HIGH (ref 0.0–0.2)

## 2020-02-17 LAB — RETICULOCYTES
Immature Retic Fract: 26.1 % — ABNORMAL HIGH (ref 2.3–15.9)
RBC.: 2.97 MIL/uL — ABNORMAL LOW (ref 4.22–5.81)
Retic Count, Absolute: 528 10*3/uL — ABNORMAL HIGH (ref 19.0–186.0)
Retic Ct Pct: 19.6 % — ABNORMAL HIGH (ref 0.4–3.1)

## 2020-02-17 MED ORDER — SODIUM CHLORIDE 0.9% FLUSH: 3.0000 mL | Freq: Once | INTRAVENOUS | Status: DC

## 2020-02-17 MED ORDER — HYDROMORPHONE HCL 1 MG/ML IJ SOLN
1.0000 mg | INTRAMUSCULAR | Status: AC
  Administered 2020-02-17: 1 mg via INTRAVENOUS
  Filled 2020-02-17: qty 1

## 2020-02-17 MED ORDER — KETOROLAC TROMETHAMINE 30 MG/ML IJ SOLN
15.0000 mg | Freq: Once | INTRAMUSCULAR | Status: AC
  Administered 2020-02-17: 15 mg via INTRAVENOUS
  Filled 2020-02-17: qty 1

## 2020-02-17 MED ORDER — HYDROMORPHONE HCL 1 MG/ML IJ SOLN
0.5000 mg | INTRAMUSCULAR | Status: AC
  Administered 2020-02-17: 0.5 mg via INTRAVENOUS
  Filled 2020-02-17: qty 1

## 2020-02-17 MED ORDER — HYDROMORPHONE HCL 1 MG/ML IJ SOLN
0.5000 mg | Freq: Once | INTRAMUSCULAR | Status: AC
  Administered 2020-02-17: 0.5 mg via INTRAVENOUS
  Filled 2020-02-17: qty 1

## 2020-02-17 NOTE — ED Notes (Signed)
Patient transported to X-ray 

## 2020-02-17 NOTE — Discharge Instructions (Addendum)
You have been seen here for sickle cell pain crisis.  Lab work and imaging all look reassuring.  I recommend that you continue your at home pain medications.  I want you to follow-up with your sickle cell pain doctor as I feel you need further management and evaluation.  I want to come back to the emergency department if you develop increased joint pain, redness, swelling, fever, chills, shortness breath, chest pain, severe abdominal pain as these symptoms require further evaluation and management.

## 2020-02-17 NOTE — ED Provider Notes (Signed)
[facility] DEPT Provider Note   CSN: [id number] Arrival date & time: 02/17/20  1007     History Chief Complaint  Patient presents with  . Sickle Cell Pain Crisis    Matthew Frederick is a 19 y.o. male.  HPI   Patient presents emergency department with chief complaint of sickle cell pain crisis that started this morning at 7 AM.  Patient explains this feels like his typical sickle cell pain crisis and admits to right knee pain.  He describes the pain as a sharp pain that does not radiate and is constant.  He denies any trauma to the area, denies abdominal pain, chest pain, shortness of breath.  Patient states he took his oxycodone which has not provided him any relief.  Patient has not medical history of sickle cell anemia.  Patient denies headache, fever, chills, sore throat, chest pain, shortness of breath, abdominal pain, nausea, vomiting, diarrhea, dysuria, pedal edema.  Past Medical History:  Diagnosis Date  . Sickle cell anemia [facility])     Patient Active Problem List   Diagnosis Date Noted  . Sickle cell anemia with crisis ([place]) 01/21/2020  . Anemia   . Sickle-cell crisis ([place]) 09/03/2019  . Sickle cell anemia with pain ([place]) 08/05/2019  . Abnormal liver function 04/02/2019  . Thrombocytopenia ([place]) 11/24/2018  . Leukocytosis 11/22/2018  . Chest pain   . Coccyx pain   . Sickle cell pain crisis ([place])   . Fever   . Sickle cell crisis ([place]) 07/23/2016  . Transition of care performed with sharing of clinical summary 04/28/2016  . Need for immunization against influenza 04/22/2012    History reviewed. No pertinent surgical history.     Family History  Problem Relation Age of Onset  . Sickle cell anemia Father   . Diabetes Maternal Grandmother     Social History   Tobacco Use  . Smoking status: Never Smoker  . Smokeless tobacco: Never Used  Vaping Use  . Vaping Use: Never used  Substance Use Topics  . Alcohol use: No  . Drug use:  No    Home Medications Prior to Admission medications   Medication Sig Start Date End Date Taking? Authorizing Provider  folic acid (FOLVITE) 1 MG tablet Take 1 mg by mouth at bedtime.    Yes [provider]  hydroxyurea (HYDREA) 500 MG capsule Take 1,500 mg by mouth at bedtime. May take with food to minimize GI side effects.    Yes [provider]  oxyCODONE (OXY IR/ROXICODONE) 5 MG immediate release tablet Take 10 mg by mouth every 6 (six) hours as needed for pain. 01/30/20  Yes [provider]  Oxycodone HCl 10 MG TABS Take 1 tablet (10 mg total) by mouth every 6 (six) hours as needed. Patient not taking: Reported on 02/17/2020 12/05/19   Dorena Dew, FNP    Allergies    Patient has no known allergies.  Review of Systems   Review of Systems  Constitutional: Negative for chills and fever.  HENT: Negative for congestion, sore throat and trouble swallowing.   Eyes: Negative for photophobia.  Respiratory: Negative for cough and shortness of breath.   Cardiovascular: Negative for chest pain, palpitations and leg swelling.  Gastrointestinal: Negative for abdominal pain, diarrhea, nausea and vomiting.  Genitourinary: Negative for enuresis, flank pain and scrotal swelling.  Musculoskeletal: Negative for back pain and joint swelling.       Admits to right knee pain.  Skin: Negative for rash.  Neurological: Negative for dizziness and headaches.  Hematological: Does not bruise/bleed easily.    Physical Exam Updated Vital Signs BP 115/75   Pulse 69   Temp 98.9 F (37.2 C) (Oral)   Resp 17   Ht 5' 9"  (1.753 m)   Wt 59.4 kg   SpO2 90%   BMI 19.35 kg/m   Physical Exam Vitals and nursing note reviewed.  Constitutional:      General: He is in acute distress.     Appearance: He is not ill-appearing.  HENT:     Head: Normocephalic and atraumatic.     Nose: No congestion.     Mouth/Throat:     Mouth: Mucous membranes are moist.     Pharynx:  Oropharynx is clear.  Eyes:     General: No scleral icterus. Cardiovascular:     Rate and Rhythm: Normal rate and regular rhythm.     Pulses: Normal pulses.     Heart sounds: No murmur heard.  No friction rub. No gallop.   Pulmonary:     Effort: No respiratory distress.     Breath sounds: No stridor. No wheezing, rhonchi or rales.  Chest:     Chest wall: No tenderness.  Abdominal:     General: There is no distension.     Tenderness: There is no abdominal tenderness. There is no right CVA tenderness, left CVA tenderness or guarding.  Musculoskeletal:        General: No swelling or tenderness.     Right lower leg: No edema.     Left lower leg: No edema.     Comments: Joints were visualized, no gross abnormalities noted, it was not erythematous, not warm to the touch, patient had full range of motion, good pedal pulses, good capillary refill.  Skin:    General: Skin is warm and dry.     Capillary Refill: Capillary refill takes less than 2 seconds.     Coloration: Skin is not jaundiced or pale.     Findings: No lesion or rash.     Comments: skin exam was performed no rashes, lesions, erythematous  noted.  Neurological:     Mental Status: He is alert.  Psychiatric:        Mood and Affect: Mood normal.     ED Results / Procedures / Treatments   Labs (all labs ordered are listed, but only abnormal results are displayed) Labs Reviewed  COMPREHENSIVE METABOLIC PANEL - Abnormal; Notable for the following components:      Result Value   Glucose, Bld 135 (*)    Total Protein 8.3 (*)    Total Bilirubin 4.4 (*)    All other components within normal limits  CBC WITH DIFFERENTIAL/PLATELET - Abnormal; Notable for the following components:   WBC 17.0 (*)    RBC 2.98 (*)    Hemoglobin 9.8 (*)    HCT 26.5 (*)    MCHC 37.0 (*)    RDW 21.3 (*)    Platelets 408 (*)    nRBC 2.3 (*)    Neutro Abs 13.5 (*)    Monocytes Absolute 2.0 (*)    Abs Immature Granulocytes 0.13 (*)    All other  components within normal limits  RETICULOCYTES - Abnormal; Notable for the following components:   Retic Ct Pct 19.6 (*)    RBC. 2.97 (*)    Retic Count, Absolute 528.0 (*)    Immature Retic Fract 26.1 (*)    All other components within normal limits  EKG None  Radiology DG Chest 2 View  Result Date: 02/17/2020 CLINICAL DATA:  Sickle cell pain crisis EXAM: CHEST - 2 VIEW COMPARISON:  01/21/2020 FINDINGS: The heart size and mediastinal contours are within normal limits. Both lungs are clear. Chronic osseous manifestations of sickle cell disease, similar in appearance to prior. IMPRESSION: No active cardiopulmonary disease. Electronically Signed   By: Davina Poke D.O.   On: 02/17/2020 11:46    Procedures Procedures (including critical care time)  Medications Ordered in ED Medications  sodium chloride flush (NS) 0.9 % injection 3 mL (has no administration in time range)  HYDROmorphone (DILAUDID) injection 1 mg (1 mg Intravenous Given 02/17/20 1149)  HYDROmorphone (DILAUDID) injection 0.5 mg (0.5 mg Intravenous Given 02/17/20 1246)  HYDROmorphone (DILAUDID) injection 0.5 mg (0.5 mg Intravenous Given 02/17/20 1332)  ketorolac (TORADOL) 30 MG/ML injection 15 mg (15 mg Intravenous Given 02/17/20 1359)    ED Course  I have reviewed the triage vital signs and the nursing notes.  Pertinent labs & imaging results that were available during my care of the patient were reviewed by me and considered in my medical decision making (see chart for details).    MDM Rules/Calculators/A&P                          I have personally reviewed all imaging, labs and have interpreted them.  On exam patient appeared to be in some distress, vital signs reassuring, exam of right knee did not show any acute abnormalities, no signs of septic arthritis, no abdominal pain, no chest pain, lung sounds clear bilaterally.  After reviewing patient notes patient receives 1 mg of Dilaudid IV and then receives 0.5  Dilaudid 30 minutes after.  We will start him on this and reassess as well as obtain screening labs.  12:35 Patient was reassessed states his pain has come down from a 10 out of 10 to 6 out of 10 of his right knee. will provide 0.5 Dilaudid and reassess.  13:15 Patient was reassessed states that he still has right knee pain states it feels the same after second dose of Dilaudid.  Will provide third dose of Dilaudid and Toradol.  If pain is not controlled will consult hospitalist for possible admission.  14:04 patient states pain is getting better states pain is now 5 out of 10.   14:50 patient states she is feeling much better pain is fully under control at this time able to ambulate without difficulty.  I have low suspicion for septic arthritis all joints were visualized, there is no erythema, swelling, joints not warm to touch, full range of motion.  There are no indications for further imaging or evaluation of right knee.  I have low suspicion for acute chest pain syndrome as patient denies chest pain, shortness of breath, chest x-ray did not show any acute abnormalities.  I have low suspicion for aplastic anemia as patient had elevated reticulocyte count.  Low suspicion for hepatosplenic syndrome as liver enzymes as well as alk phos were within normal limits, no abdominal pain upon palpation.  CMP shows no electrolyte abnormalities, does show elevated T bili likely secondary to sickle cell.  Patient's CBC shows leukocytosis of 17 but I have low suspicion for systemic infection as vital signs are within normal limits, no obvious source of infection identified.  Most likely this is reactive. Patient has microcytic anemia which appears to be at baseline for patient most likely secondary to chronic  diseases.  Patient appears resting comfortably bed in show no acute signs stress.  Vital signs have remained stable does not meet criteria to be admitted to the hospital.  Likely patient suffering from sickle  cell pain crisis and I recommend that he continues his at home pain medications.  Patient was discussed with attending who agrees assessment and plan.  Patient was given at home care as well strict return precautions.  Patient verbalized understanding agrees to plan.     Final Clinical Impression(s) / ED Diagnoses Final diagnoses:  None    Rx / DC Orders ED Discharge Orders    None       Marcello Fennel, PA-C 02/17/20 Valle, Ankit, MD 02/17/20 [phone]

## 2020-02-17 NOTE — ED Triage Notes (Signed)
Patient c/o sickle cell pain right leg, knee, and buttock since 0700 today.

## 2020-02-18 ENCOUNTER — Encounter (HOSPITAL_COMMUNITY): Payer: Self-pay | Admitting: Emergency Medicine

## 2020-02-18 ENCOUNTER — Emergency Department (HOSPITAL_COMMUNITY): Payer: Medicaid Other

## 2020-02-18 ENCOUNTER — Inpatient Hospital Stay (HOSPITAL_COMMUNITY)
Admission: EM | Admit: 2020-02-18 | Discharge: 2020-02-21 | DRG: 812 | Disposition: A | Discharge status: Home / Self Care | Payer: Medicaid Other | Attending: Internal Medicine | Admitting: Internal Medicine

## 2020-02-18 ENCOUNTER — Other Ambulatory Visit: Payer: Self-pay

## 2020-02-18 DIAGNOSIS — Z20822 Contact with and (suspected) exposure to covid-19: Secondary | ICD-10-CM | POA: Diagnosis not present

## 2020-02-18 DIAGNOSIS — D638 Anemia in other chronic diseases classified elsewhere: Secondary | ICD-10-CM | POA: Diagnosis not present

## 2020-02-18 DIAGNOSIS — G894 Chronic pain syndrome: Secondary | ICD-10-CM | POA: Diagnosis not present

## 2020-02-18 DIAGNOSIS — Z832 Family history of diseases of the blood and blood-forming organs and certain disorders involving the immune mechanism: Secondary | ICD-10-CM

## 2020-02-18 DIAGNOSIS — R9431 Abnormal electrocardiogram [ECG] [EKG]: Secondary | ICD-10-CM | POA: Diagnosis not present

## 2020-02-18 DIAGNOSIS — Z79899 Other long term (current) drug therapy: Secondary | ICD-10-CM

## 2020-02-18 DIAGNOSIS — M25561 Pain in right knee: Secondary | ICD-10-CM | POA: Diagnosis not present

## 2020-02-18 DIAGNOSIS — Z833 Family history of diabetes mellitus: Secondary | ICD-10-CM | POA: Diagnosis not present

## 2020-02-18 DIAGNOSIS — R0902 Hypoxemia: Secondary | ICD-10-CM | POA: Diagnosis not present

## 2020-02-18 DIAGNOSIS — D72829 Elevated white blood cell count, unspecified: Secondary | ICD-10-CM | POA: Diagnosis not present

## 2020-02-18 DIAGNOSIS — D5700 Hb-SS disease with crisis, unspecified: Principal | ICD-10-CM

## 2020-02-18 DIAGNOSIS — D571 Sickle-cell disease without crisis: Secondary | ICD-10-CM | POA: Diagnosis not present

## 2020-02-18 LAB — CBC WITH DIFFERENTIAL/PLATELET
Abs Immature Granulocytes: 0.1 10*3/uL — ABNORMAL HIGH (ref 0.00–0.07)
Basophils Absolute: 0.1 10*3/uL (ref 0.0–0.1)
Basophils Relative: 1 %
Eosinophils Absolute: 0 10*3/uL (ref 0.0–0.5)
Eosinophils Relative: 0 %
HCT: 28.1 % — ABNORMAL LOW (ref 39.0–52.0)
Hemoglobin: 10.4 g/dL — ABNORMAL LOW (ref 13.0–17.0)
Immature Granulocytes: 1 %
Lymphocytes Relative: 8 %
Lymphs Abs: 1.6 10*3/uL (ref 0.7–4.0)
MCH: 33.2 pg (ref 26.0–34.0)
MCHC: 37 g/dL — ABNORMAL HIGH (ref 30.0–36.0)
MCV: 89.8 fL (ref 80.0–100.0)
Monocytes Absolute: 2.7 10*3/uL — ABNORMAL HIGH (ref 0.1–1.0)
Monocytes Relative: 14 %
Neutro Abs: 14.2 10*3/uL — ABNORMAL HIGH (ref 1.7–7.7)
Neutrophils Relative %: 76 %
Platelets: 384 10*3/uL (ref 150–400)
RBC: 3.13 MIL/uL — ABNORMAL LOW (ref 4.22–5.81)
RDW: 21.6 % — ABNORMAL HIGH (ref 11.5–15.5)
WBC: 18.7 10*3/uL — ABNORMAL HIGH (ref 4.0–10.5)
nRBC: 2.4 % — ABNORMAL HIGH (ref 0.0–0.2)

## 2020-02-18 LAB — COMPREHENSIVE METABOLIC PANEL
ALT: 18 U/L (ref 0–44)
AST: 34 U/L (ref 15–41)
Albumin: 4.5 g/dL (ref 3.5–5.0)
Alkaline Phosphatase: 97 U/L (ref 38–126)
Anion gap: 7 (ref 5–15)
BUN: 8 mg/dL (ref 6–20)
CO2: 29 mmol/L (ref 22–32)
Calcium: 9.1 mg/dL (ref 8.9–10.3)
Chloride: 101 mmol/L (ref 98–111)
Creatinine, Ser: 0.65 mg/dL (ref 0.61–1.24)
GFR calc Af Amer: >60 mL/min (ref >60)
GFR calc non Af Amer: >60 mL/min (ref >60)
Glucose, Bld: 108 mg/dL — ABNORMAL HIGH (ref 70–99)
Potassium: 4.4 mmol/L (ref 3.5–5.1)
Sodium: 137 mmol/L (ref 135–145)
Total Bilirubin: 6.2 mg/dL — ABNORMAL HIGH (ref 0.3–1.2)
Total Protein: 8.1 g/dL (ref 6.5–8.1)

## 2020-02-18 LAB — RETICULOCYTES
Immature Retic Fract: 26.7 % — ABNORMAL HIGH (ref 2.3–15.9)
RBC.: 3.13 MIL/uL — ABNORMAL LOW (ref 4.22–5.81)
Retic Count, Absolute: 633.5 10*3/uL — ABNORMAL HIGH (ref 19.0–186.0)
Retic Ct Pct: 19.8 % — ABNORMAL HIGH (ref 0.4–3.1)

## 2020-02-18 LAB — SARS CORONAVIRUS 2 BY RT PCR (HOSPITAL ORDER, PERFORMED IN ~~LOC~~ HOSPITAL LAB): SARS Coronavirus 2: NEGATIVE

## 2020-02-18 LAB — PROTIME-INR
INR: 1.3 — ABNORMAL HIGH (ref 0.8–1.2)
Prothrombin Time: 16 seconds — ABNORMAL HIGH (ref 11.4–15.2)

## 2020-02-18 MED ORDER — DIPHENHYDRAMINE HCL 12.5 MG/5ML PO ELIX: 12.5000 mg | ORAL_SOLUTION | Freq: Four times a day (QID) | ORAL | Status: DC | PRN

## 2020-02-18 MED ORDER — ACETAMINOPHEN 650 MG RE SUPP: 650.0000 mg | Freq: Four times a day (QID) | RECTAL | Status: DC | PRN

## 2020-02-18 MED ORDER — SENNOSIDES-DOCUSATE SODIUM 8.6-50 MG PO TABS
1.0000 | ORAL_TABLET | Freq: Two times a day (BID) | ORAL | Status: DC
  Administered 2020-02-18 – 2020-02-20 (×4): 1 via ORAL
  Filled 2020-02-18 (×6): qty 1

## 2020-02-18 MED ORDER — SODIUM CHLORIDE 0.45 % IV SOLN
INTRAVENOUS | Status: DC
  Administered 2020-02-19: 1000 mL via INTRAVENOUS

## 2020-02-18 MED ORDER — ACETAMINOPHEN 325 MG PO TABS: 650.0000 mg | ORAL_TABLET | Freq: Four times a day (QID) | ORAL | Status: DC | PRN

## 2020-02-18 MED ORDER — DIPHENHYDRAMINE HCL 50 MG/ML IJ SOLN
25.0000 mg | Freq: Once | INTRAMUSCULAR | Status: AC
  Administered 2020-02-18: 25 mg via INTRAVENOUS
  Filled 2020-02-18: qty 1

## 2020-02-18 MED ORDER — HYDROXYUREA 500 MG PO CAPS
1500.0000 mg | ORAL_CAPSULE | Freq: Every day | ORAL | Status: DC
  Administered 2020-02-18 – 2020-02-20 (×3): 1500 mg via ORAL
  Filled 2020-02-18 (×4): qty 3

## 2020-02-18 MED ORDER — HYDROMORPHONE HCL 1 MG/ML IJ SOLN
1.0000 mg | INTRAMUSCULAR | Status: AC
  Administered 2020-02-18: 1 mg via INTRAVENOUS
  Filled 2020-02-18: qty 1

## 2020-02-18 MED ORDER — KETOROLAC TROMETHAMINE 15 MG/ML IJ SOLN
15.0000 mg | Freq: Four times a day (QID) | INTRAMUSCULAR | Status: DC | PRN
  Administered 2020-02-19 – 2020-02-20 (×2): 15 mg via INTRAVENOUS
  Filled 2020-02-18 (×3): qty 1

## 2020-02-18 MED ORDER — ONDANSETRON HCL 4 MG/2ML IJ SOLN
4.0000 mg | Freq: Four times a day (QID) | INTRAMUSCULAR | Status: DC | PRN
  Administered 2020-02-20: 4 mg via INTRAVENOUS
  Filled 2020-02-18: qty 2

## 2020-02-18 MED ORDER — MORPHINE SULFATE 30 MG PO TABS
30.0000 mg | ORAL_TABLET | Freq: Once | ORAL | Status: AC
  Administered 2020-02-18: 30 mg via ORAL
  Filled 2020-02-18: qty 1

## 2020-02-18 MED ORDER — DIPHENHYDRAMINE HCL 50 MG/ML IJ SOLN: 12.5000 mg | Freq: Four times a day (QID) | INTRAMUSCULAR | Status: DC | PRN

## 2020-02-18 MED ORDER — ENOXAPARIN SODIUM 40 MG/0.4ML ~~LOC~~ SOLN
40.0000 mg | SUBCUTANEOUS | Status: DC
  Administered 2020-02-18 – 2020-02-19 (×2): 40 mg via SUBCUTANEOUS
  Filled 2020-02-18 (×3): qty 0.4

## 2020-02-18 MED ORDER — HYDROMORPHONE HCL 1 MG/ML IJ SOLN
0.5000 mg | INTRAMUSCULAR | Status: AC
  Administered 2020-02-18: 0.5 mg via INTRAVENOUS
  Filled 2020-02-18: qty 1

## 2020-02-18 MED ORDER — POLYETHYLENE GLYCOL 3350 17 G PO PACK: 17.0000 g | PACK | Freq: Every day | ORAL | Status: DC | PRN

## 2020-02-18 MED ORDER — SODIUM CHLORIDE 0.9% FLUSH: 9.0000 mL | INTRAVENOUS | Status: DC | PRN

## 2020-02-18 MED ORDER — KETOROLAC TROMETHAMINE 15 MG/ML IJ SOLN
15.0000 mg | Freq: Once | INTRAMUSCULAR | Status: AC
  Administered 2020-02-18: 15 mg via INTRAVENOUS
  Filled 2020-02-18: qty 1

## 2020-02-18 MED ORDER — DIPHENHYDRAMINE HCL 25 MG PO CAPS
25.0000 mg | ORAL_CAPSULE | ORAL | Status: DC | PRN
  Administered 2020-02-18: 25 mg via ORAL
  Filled 2020-02-18: qty 1

## 2020-02-18 MED ORDER — HYDROMORPHONE HCL 1 MG/ML IJ SOLN
1.0000 mg | INTRAMUSCULAR | Status: DC | PRN
  Filled 2020-02-18: qty 1

## 2020-02-18 MED ORDER — ONDANSETRON HCL 4 MG/2ML IJ SOLN: 4.0000 mg | INTRAMUSCULAR | Status: DC | PRN

## 2020-02-18 MED ORDER — ENOXAPARIN SODIUM 40 MG/0.4ML ~~LOC~~ SOLN: 40.0000 mg | SUBCUTANEOUS | Status: DC

## 2020-02-18 MED ORDER — SODIUM CHLORIDE 0.45 % IV BOLUS
1000.0000 mL | Freq: Once | INTRAVENOUS | Status: AC
  Administered 2020-02-18: 1000 mL via INTRAVENOUS

## 2020-02-18 MED ORDER — HYDROMORPHONE 1 MG/ML IV SOLN
INTRAVENOUS | Status: DC
  Administered 2020-02-18: 30 mg via INTRAVENOUS
  Administered 2020-02-19: 1.2 mg via INTRAVENOUS
  Administered 2020-02-19: 2.4 mg via INTRAVENOUS
  Administered 2020-02-19: 1.4 mg via INTRAVENOUS
  Filled 2020-02-18: qty 30

## 2020-02-18 MED ORDER — FOLIC ACID 1 MG PO TABS
1.0000 mg | ORAL_TABLET | Freq: Every day | ORAL | Status: DC
  Administered 2020-02-18 – 2020-02-20 (×3): 1 mg via ORAL
  Filled 2020-02-18 (×3): qty 1

## 2020-02-18 MED ORDER — NALOXONE HCL 0.4 MG/ML IJ SOLN: 0.4000 mg | INTRAMUSCULAR | Status: DC | PRN

## 2020-02-18 MED ORDER — ONDANSETRON HCL 4 MG PO TABS: 4.0000 mg | ORAL_TABLET | Freq: Four times a day (QID) | ORAL | Status: DC | PRN

## 2020-02-18 NOTE — ED Triage Notes (Signed)
Patient c/o continued right leg pain x2 days. Hx sickle cell. Seen yesterday for same.

## 2020-02-18 NOTE — ED Provider Notes (Signed)
[facility]-EMERGENCY DEPT Provider Note   CSN: [id number] Arrival date & time: 02/18/20  1449    History Chief Complaint  Patient presents with  . Sickle Cell Pain Crisis    Matthew Frederick is a 19 y.o. male with history of sickle cell anemia who presents for evaluation of sickle cell pain.  States pain typical of his prior sickle cell crises.  Seen here yesterday however has continued pain.  Is been taking his home oxycodone without relief.  Rates his pain a 9/10.  No chest pain, shortness of breath.  No unilateral leg swelling, redness or warmth.  Pain located to bilateral lower extremities however right greater than left.  No fever, chills, nausea vomiting, chest pain, shortness of breath abdominal pain, diarrhea, dysuria, rashes, paresthesias.  Denies additional aggravating or relieving factors.  History obtained from patient and past medical history. No interpretor was used.  HPI     Past Medical History:  Diagnosis Date  . Sickle cell anemia [facility])     Patient Active Problem List   Diagnosis Date Noted  . Sickle cell anemia with crisis (HCC) 01/21/2020  . Anemia   . Sickle-cell crisis (HCC) 09/03/2019  . Sickle cell anemia with pain (HCC) 08/05/2019  . Abnormal liver function 04/02/2019  . Thrombocytopenia (HCC) 11/24/2018  . Leukocytosis 11/22/2018  . Chest pain   . Coccyx pain   . Sickle cell pain crisis (HCC)   . Fever   . Sickle cell crisis (HCC) 07/23/2016  . Transition of care performed with sharing of clinical summary 04/28/2016  . Need for immunization against influenza 04/22/2012    History reviewed. No pertinent surgical history.     Family History  Problem Relation Age of Onset  . Sickle cell anemia Father   . Diabetes Maternal Grandmother     Social History   Tobacco Use  . Smoking status: Never Smoker  . Smokeless tobacco: Never Used  Vaping Use  . Vaping Use: Never used  Substance Use Topics  . Alcohol use: No  .  Drug use: No    Home Medications Prior to Admission medications   Medication Sig Start Date End Date Taking? Authorizing Provider  folic acid (FOLVITE) 1 MG tablet Take 1 mg by mouth at bedtime.    Yes [provider]  hydroxyurea (HYDREA) 500 MG capsule Take 1,500 mg by mouth at bedtime.    Yes [provider]  Oxycodone HCl 10 MG TABS Take 1 tablet (10 mg total) by mouth every 6 (six) hours as needed. Patient taking differently: Take 10 mg by mouth every 6 (six) hours as needed (pain).  12/05/19  Yes Massie Maroon, FNP    Allergies    Patient has no known allergies.  Review of Systems   Review of Systems  Constitutional: Negative.   HENT: Negative.   Respiratory: Negative.   Cardiovascular: Negative.   Gastrointestinal: Negative.   Genitourinary: Negative.   Musculoskeletal: Negative for arthralgias, back pain, gait problem, joint swelling, myalgias, neck pain and neck stiffness.       BL lower extremity pain  Skin: Negative.   Neurological: Negative.   All other systems reviewed and are negative.   Physical Exam Updated Vital Signs BP 126/78   Pulse 81   Temp 99.2 F (37.3 C) (Oral)   Resp 14   SpO2 94%   Physical Exam Vitals and nursing note reviewed.  Constitutional:      General: He is not in acute  distress.    Appearance: He is well-developed. He is not ill-appearing, toxic-appearing or diaphoretic.  HENT:     Head: Normocephalic and atraumatic.     Nose: Nose normal.     Mouth/Throat:     Mouth: Mucous membranes are moist.  Eyes:     Pupils: Pupils are equal, round, and reactive to light.  Cardiovascular:     Rate and Rhythm: Normal rate and regular rhythm.     Pulses: Normal pulses.          Radial pulses are 2+ on the right side and 2+ on the left side.       Dorsalis pedis pulses are 2+ on the right side and 2+ on the left side.       Posterior tibial pulses are 2+ on the right side and 2+ on the left side.     Heart sounds:  Normal heart sounds.  Pulmonary:     Effort: Pulmonary effort is normal. No respiratory distress.     Breath sounds: Normal breath sounds.  Abdominal:     General: Bowel sounds are normal. There is no distension.     Palpations: Abdomen is soft.  Musculoskeletal:        General: No swelling or deformity. Normal range of motion.     Cervical back: Normal range of motion and neck supple.     Right lower leg: No edema.     Left lower leg: No edema.     Comments: Diffuse tenderness bilateral lower extremities however right greater than left.  Compartments soft.  Homans negative bilaterally.  Full range of motion to bilateral knees without pain.  No overlying edema, erythema or warmth.  Skin:    General: Skin is warm and dry.     Capillary Refill: Capillary refill takes less than 2 seconds.     Comments: No edema, erythema or warmth.  No fluctuance or induration.  Tactile temperature to extremities  Neurological:     General: No focal deficit present.     Mental Status: He is alert and oriented to person, place, and time.     Comments: Intact sensation bilaterally 5/5 strength bilateral lower extremities. Ambulatory at difficulty     ED Results / Procedures / Treatments   Labs (all labs ordered are listed, but only abnormal results are displayed) Labs Reviewed  RETICULOCYTES - Abnormal; Notable for the following components:      Result Value   Retic Ct Pct 19.8 (*)    RBC. 3.13 (*)    Retic Count, Absolute 633.5 (*)    Immature Retic Fract 26.7 (*)    All other components within normal limits  COMPREHENSIVE METABOLIC PANEL - Abnormal; Notable for the following components:   Glucose, Bld 108 (*)    Total Bilirubin 6.2 (*)    All other components within normal limits  CBC WITH DIFFERENTIAL/PLATELET - Abnormal; Notable for the following components:   WBC 18.7 (*)    RBC 3.13 (*)    Hemoglobin 10.4 (*)    HCT 28.1 (*)    MCHC 37.0 (*)    RDW 21.6 (*)    nRBC 2.4 (*)    Neutro  Abs 14.2 (*)    Monocytes Absolute 2.7 (*)    Abs Immature Granulocytes 0.10 (*)    All other components within normal limits  PROTIME-INR - Abnormal; Notable for the following components:   Prothrombin Time 16.0 (*)    INR 1.3 (*)    All  other components within normal limits  SARS CORONAVIRUS 2 BY RT PCR [facility] ORDER, PERFORMED IN [facility] LAB)    EKG EKG Interpretation  Date/Time:  Saturday February 18 2020 16:20:34 EDT Ventricular Rate:  95 PR Interval:    QRS Duration: 80 QT Interval:  324 QTC Calculation: 408 R Axis:   55 Text Interpretation: Sinus rhythm Borderline short PR interval Probable left ventricular hypertrophy Nonspecific T abnormalities, anterior leads no acute change when compared to prior Confirmed by Marianna Fuss ([id number]) on 02/18/2020 6:07:10 PM   Radiology DG Chest 2 View  Result Date: 02/18/2020 CLINICAL DATA:  Low O2 sats.  Sickle cell. EXAM: CHEST - 2 VIEW COMPARISON:  02/17/2020 FINDINGS: The heart size and mediastinal contours are within normal limits. Both lungs are clear. The visualized skeletal structures are unremarkable. IMPRESSION: Normal study. Electronically Signed   By: Charlett Nose M.D.   On: 02/18/2020 17:16   DG Chest 2 View  Result Date: 02/17/2020 CLINICAL DATA:  Sickle cell pain crisis EXAM: CHEST - 2 VIEW COMPARISON:  01/21/2020 FINDINGS: The heart size and mediastinal contours are within normal limits. Both lungs are clear. Chronic osseous manifestations of sickle cell disease, similar in appearance to prior. IMPRESSION: No active cardiopulmonary disease. Electronically Signed   By: Duanne Guess D.O.   On: 02/17/2020 11:46    Procedures .Critical Care Performed by: Linwood Dibbles, PA-C Authorized by: Linwood Dibbles, PA-C   Critical care provider statement:    Critical care time (minutes):  35   Critical care was necessary to treat or prevent imminent or life-threatening deterioration of the following  conditions:  Circulatory failure   Critical care was time spent personally by me on the following activities:  Discussions with consultants, evaluation of patient's response to treatment, examination of patient, ordering and performing treatments and interventions, ordering and review of laboratory studies, ordering and review of radiographic studies, pulse oximetry, re-evaluation of patient's condition, obtaining history from patient or surrogate and review of old charts   (including critical care time)  Medications Ordered in ED Medications  diphenhydrAMINE (BENADRYL) capsule 25 mg (25 mg Oral Given 02/18/20 1617)  0.45 % sodium chloride infusion ( Intravenous New Bag/Given (Non-Interop) 02/18/20 1807)  ondansetron (ZOFRAN) injection 4 mg (has no administration in time range)  morphine (MSIR) tablet 30 mg (30 mg Oral Given 02/18/20 1617)  HYDROmorphone (DILAUDID) injection 0.5 mg (0.5 mg Intravenous Given 02/18/20 1808)  HYDROmorphone (DILAUDID) injection 1 mg (1 mg Intravenous Given 02/18/20 1836)  diphenhydrAMINE (BENADRYL) injection 25 mg (25 mg Intravenous Given 02/18/20 1809)  ketorolac (TORADOL) 15 MG/ML injection 15 mg (15 mg Intravenous Given 02/18/20 1953)    ED Course  I have reviewed the triage vital signs and the nursing notes.  Pertinent labs & imaging results that were available during my care of the patient were reviewed by me and considered in my medical decision making (see chart for details).  19 year old known sickle cell presents for evaluation of pain consistent with his prior sickle cell crises.  Taking pain medication without relief.  Rates his pain a 9/10.  Located to bilateral lower extremities are worse to right lower extremity.  He is neurovascularly intact.  Normal musculoskeletal exam.  No unilateral leg swelling, redness or warmth.  He has no chest pain or shortness of breath.  No hypoxia or tachycardia.  I have low suspicion for acute chest syndrome, VTE.  Full range of  motion of joints.  Low suspicion for septic  joint.  Plan labs, imaging and reassess. No [place] available tonight to [place] RLE however pain seems consistent with prior sickle cell crises this is likely the cause of his pain. Low suspicion for DVT.  Labs and imaging personally reviewed and interpreted: CBC with leukocytosis at 18.7, similar to prior, hemoglobin at baseline likely anemia of chronic disease Metabolic panel with hyperglycemia to 108, bilirubin 6.2, elevated previously.  Abdomen soft, nontender.  Negative Murphy sign.  Low suspicion for acute intra-abdominal process. DG chest pain from triage which shows no infiltrates, cardiomegaly, pulmonary edema, pneumothorax EKG without STEMI, similar to previous  Patient reassessed. Pain mildly improved however does not feel his pain is well controlled enough to go home.  Will give additional dose of Toradol.  And consult hospitalist for admission.  CONSULT with Dr. Robb Matar with [facility] who agrees to evaluate patient for admission.  The patient appears reasonably stabilized for admission considering the current resources, flow, and capabilities available in the ED at this time, and I doubt any other [facility] requiring further screening and/or treatment in the ED prior to admission.     MDM Rules/Calculators/A&P                           Final Clinical Impression(s) / ED Diagnoses Final diagnoses:  Sickle cell pain crisis [facility])    Rx / DC Orders ED Discharge Orders    None       Hien Perreira A, PA-C 02/18/20 2028    Milagros Loll, MD 02/20/20 1625

## 2020-02-18 NOTE — H&P (Signed)
History and Physical    Matthew Frederick DXI:[id number] DOB: 09/07/2000 DOA: 02/18/2020  PCP: [name], MD   Patient coming from: Home.  I have personally briefly reviewed patient's old medical records in [facility]  Chief Complaint: Sickle cell pain crisis.  HPI: Matthew Frederick is a 19 y.o. male with medical history significant of sickle cell anemia who is returning to the emergency department for a second day in a row due to bilateral lower extremities typical of his sickle cell pain crisis.  He denies fever, chills, sore throat, rhinorrhea, cough, wheezing or hemoptysis.  No chest pain, palpitations, dizziness, diaphoresis, PND, orthopnea or pitting edema of the lower extremities.  He denies abdominal pain, nausea, vomiting, diarrhea, constipation, melena or hematochezia.  No dysuria, frequency or hematuria.  No polyuria, polydipsia, polyphagia or blurred vision.  ED Course: Initial vital signs were temperature 99.2 F, pulse 94, respirations 22, blood pressure 124/84 mmHg O2 sat 94% on room air.  The patient received IV fluids, Benadryl 25 mg IVP, analgesics (hydromorphone and Toradol) in the emergency department.  He stated he was feeling better.  CBC showed white count of 18.7, hemoglobin 10.4 g/dL and platelets 767.  Reticulocyte count was 19.8%.  PT was 16.0 and INR 1.3.  CMP showed a glucose of 108 and total bilirubin of 6.2 mg/dL.  The rest of the CMP values are within expected range.  Coronavirus PCR was negative.  Chest radiograph today and yesterday did not show any acute cardiopulmonary pathology.  Review of Systems: As per HPI otherwise all other systems reviewed and are negative.  Past Medical History:  Diagnosis Date  . Sickle cell anemia (HCC)     History reviewed. No pertinent surgical history.  Social History  reports that he has never smoked. He has never used smokeless tobacco. He reports that he does not drink alcohol and does not use drugs.  No Known  Allergies  Family History  Problem Relation Age of Onset  . Sickle cell anemia Father   . Diabetes Maternal Grandmother    Prior to Admission medications   Medication Sig Start Date End Date Taking? Authorizing Provider  folic acid (FOLVITE) 1 MG tablet Take 1 mg by mouth at bedtime.    Yes [provider]  hydroxyurea (HYDREA) 500 MG capsule Take 1,500 mg by mouth at bedtime.    Yes [provider]  Oxycodone HCl 10 MG TABS Take 1 tablet (10 mg total) by mouth every 6 (six) hours as needed. Patient taking differently: Take 10 mg by mouth every 6 (six) hours as needed (pain).  12/05/19  Yes Massie Maroon, FNP   Physical Exam: Vitals:   02/18/20 1945 02/18/20 2000 02/18/20 2015 02/18/20 2030  BP: 134/70 120/84 111/69 108/76  Pulse: 98 (!) 103 79 78  Resp: 16 17 13 13   Temp:      TempSrc:      SpO2: 94% 97% 92% 92%   Constitutional: NAD, calm, comfortable Eyes: PERRL, lids and conjunctivae normal ENMT: Mucous membranes are moist. Posterior pharynx clear of any exudate or lesions. Neck: normal, supple, no masses, no thyromegaly Respiratory: clear to auscultation bilaterally, no wheezing, no crackles. Normal respiratory effort. No accessory muscle use.  Cardiovascular: Regular rate and rhythm, no murmurs / rubs / gallops. No extremity edema. 2+ pedal pulses. No carotid bruits.  Abdomen: Soft, no tenderness, no masses palpated. No hepatosplenomegaly. Bowel sounds positive.  Musculoskeletal: no clubbing / cyanosis. Good ROM, no contractures. Normal muscle tone.  Skin: no rashes, lesions, ulcers. No induration Neurologic: CN 2-12 grossly intact. Sensation intact, DTR normal. Strength 5/5 in all 4.  Psychiatric: Normal judgment and insight. Alert and oriented x 3. Normal mood.   Labs on Admission: I have personally reviewed following labs and imaging studies  CBC: Recent Labs  Lab 02/17/20 1024 02/18/20 1646  WBC 17.0* 18.7*  NEUTROABS 13.5* 14.2*  HGB 9.8*  10.4*  HCT 26.5* 28.1*  MCV 88.9 89.8  PLT 408* 384   Basic Metabolic Panel: Recent Labs  Lab 02/17/20 1024 02/18/20 1646  NA 135 137  K 4.2 4.4  CL 103 101  CO2 23 29  GLUCOSE 135* 108*  BUN 12 8  CREATININE 0.76 0.65  CALCIUM 9.3 9.1   GFR: Estimated Creatinine Clearance: 124.8 mL/min (by C-G formula based on SCr of 0.65 mg/dL).  Liver Function Tests: Recent Labs  Lab 02/17/20 1024 02/18/20 1646  AST 40 34  ALT 19 18  ALKPHOS 102 97  BILITOT 4.4* 6.2*  PROT 8.3* 8.1  ALBUMIN 4.7 4.5   Radiological Exams on Admission: DG Chest 2 View  Result Date: 02/18/2020 CLINICAL DATA:  Low O2 sats.  Sickle cell. EXAM: CHEST - 2 VIEW COMPARISON:  02/17/2020 FINDINGS: The heart size and mediastinal contours are within normal limits. Both lungs are clear. The visualized skeletal structures are unremarkable. IMPRESSION: Normal study. Electronically Signed   By: Charlett Nose M.D.   On: 02/18/2020 17:16   DG Chest 2 View  Result Date: 02/17/2020 CLINICAL DATA:  Sickle cell pain crisis EXAM: CHEST - 2 VIEW COMPARISON:  01/21/2020 FINDINGS: The heart size and mediastinal contours are within normal limits. Both lungs are clear. Chronic osseous manifestations of sickle cell disease, similar in appearance to prior. IMPRESSION: No active cardiopulmonary disease. Electronically Signed   By: Duanne Guess D.O.   On: 02/17/2020 11:46   EKG: Independently reviewed. Vent. rate 95 BPM PR interval * ms QRS duration 80 ms QT/QTc 324/408 ms P-R-T axes 81 55 26 Sinus rhythm Borderline short PR interval Probable left ventricular hypertrophy Nonspecific T abnormalities, anterior leads no acute change when compared to prior  Assessment/Plan Principal Problem:   Sickle cell pain crisis (HCC) Admit to MedSurg/inpatient. Supplemental oxygen. Continue IV fluids. Start hydromorphone PCA. Antihistamines as needed. Antiemetic as needed. Continue hydroxyurea. Follow-up CBC, reticulocyte count,  CMP. Sickle cell team will follow tomorrow.  Active Problems:   Leukocytosis No fever or any other signs of infection. Monitor WBC.   DVT prophylaxis: Lovenox SQ. Code Status:   Full code. Family Communication: Disposition Plan:   Patient is from:  Home.  Anticipated DC to:  Home.  Anticipated DC date:  02/20/2020.  Anticipated DC barriers: Clinical improvement.  Consults called: Admission status:  Inpatient/MedSurg.   Severity of Illness: High given intensity of sickle cell pain crisis that has not responded to outpatient therapy.  Bobette Mo MD [facility]  How to contact the [facility] Attending or Consulting provider 7A - 7P or covering provider during after hours 7P -7A, for this patient?   1. Check the care team in [facility] and look for a) attending/consulting [facility] provider listed and b) the [facility] team listed 2. Log into [email or web address] and use [place]'s universal password to access. If you do not have the password, please contact the hospital operator. 3. Locate the [facility] provider you are looking for under [facility] and page to a number that you can be directly reached. 4. If you still have  difficulty reaching the provider, please page the [facility] (Director on Call) for the Hospitalists listed on amion for assistance.  02/18/2020, 9:17 PM   This document was created using Dragon voice recognition software and may contain some unintended transcription errors.

## 2020-02-19 DIAGNOSIS — D72829 Elevated white blood cell count, unspecified: Secondary | ICD-10-CM | POA: Diagnosis not present

## 2020-02-19 DIAGNOSIS — D5700 Hb-SS disease with crisis, unspecified: Secondary | ICD-10-CM | POA: Diagnosis not present

## 2020-02-19 MED ORDER — NALOXONE HCL 0.4 MG/ML IJ SOLN: 0.4000 mg | INTRAMUSCULAR | Status: DC | PRN

## 2020-02-19 MED ORDER — HYDROMORPHONE 1 MG/ML IV SOLN
INTRAVENOUS | Status: DC
  Administered 2020-02-19: 2.1 mg via INTRAVENOUS
  Administered 2020-02-19: 4 mg via INTRAVENOUS
  Administered 2020-02-20: 2 mg via INTRAVENOUS

## 2020-02-19 MED ORDER — SODIUM CHLORIDE 0.9% FLUSH: 9.0000 mL | INTRAVENOUS | Status: DC | PRN

## 2020-02-19 MED ORDER — SODIUM CHLORIDE 0.9 % IV SOLN
25.0000 mg | INTRAVENOUS | Status: DC | PRN
  Filled 2020-02-19: qty 0.5

## 2020-02-19 MED ORDER — ONDANSETRON HCL 4 MG/2ML IJ SOLN: 4.0000 mg | Freq: Four times a day (QID) | INTRAMUSCULAR | Status: DC | PRN

## 2020-02-19 MED ORDER — DIPHENHYDRAMINE HCL 25 MG PO CAPS: 25.0000 mg | ORAL_CAPSULE | ORAL | Status: DC | PRN

## 2020-02-19 NOTE — Progress Notes (Signed)
Patient ID: Matthew Frederick, male   DOB: 16-Feb-2001, 19 y.o.   MRN: [id number] Subjective: Matthew Frederick is an 19 year old male with a medical history significant for sicklecell disease, chronic pain syndrome, and anemia of chronic disease was admitted for sickle cell pain crisis.  Patient is still in significant pain, his pain is at 8/10 characterized as throbbing and achy and mostly in his lower extremities.  No swelling or redness.  No history of trauma.  He denies any fever, headache, chest pain, cough, shortness of breath, dizziness, urinary symptoms, nausea, vomiting, constipation or diarrhea.  Objective:  Vital signs in last 24 hours:  Vitals:   02/19/20 0309 02/19/20 0718 02/19/20 1004 02/19/20 1356  BP:  128/78 126/64 118/78  Pulse:  (!) 101 (!) 102 77  Resp: 11 16 19 12   Temp:  100.3 F (37.9 C) 100 F (37.8 C) 98.6 F (37 C)  TempSrc:  Oral Oral Oral  SpO2: 96% 92% 90% 94%  Weight:  58.7 kg      Intake/Output from previous day:   Intake/Output Summary (Last 24 hours) at 02/19/2020 1418 Last data filed at 02/19/2020 1100 Gross per 24 hour  Intake 739.06 ml  Output 1825 ml  Net -1085.94 ml    Physical Exam: General: Alert, awake, oriented x3, in no acute distress.  HEENT: [place]/AT PEERL, EOMI Neck: Trachea midline,  no masses, no thyromegal,y no JVD, no carotid bruit OROPHARYNX:  Moist, No exudate/ erythema/lesions.  Heart: Regular rate and rhythm, without murmurs, rubs, gallops, PMI non-displaced, no heaves or thrills on palpation.  Lungs: Clear to auscultation, no wheezing or rhonchi noted. No increased vocal fremitus resonant to percussion  Abdomen: Soft, nontender, nondistended, positive bowel sounds, no masses no hepatosplenomegaly noted..  Neuro: No focal neurological deficits noted cranial nerves II through XII grossly intact. DTRs 2+ bilaterally upper and lower extremities. Strength 5 out of 5 in bilateral upper and lower extremities. Musculoskeletal: No warm  swelling or erythema around joints, no spinal tenderness noted. Psychiatric: Patient alert and oriented x3, good insight and cognition, good recent to remote recall. Lymph node survey: No cervical axillary or inguinal lymphadenopathy noted.  Lab Results:  Basic Metabolic Panel:    Component Value Date/Time   NA 137 02/18/2020 1646   K 4.4 02/18/2020 1646   CL 101 02/18/2020 1646   CO2 29 02/18/2020 1646   BUN 8 02/18/2020 1646   CREATININE 0.65 02/18/2020 1646   GLUCOSE 108 (H) 02/18/2020 1646   CALCIUM 9.1 02/18/2020 1646   CBC:    Component Value Date/Time   WBC 18.7 (H) 02/18/2020 1646   HGB 10.4 (L) 02/18/2020 1646   HCT 28.1 (L) 02/18/2020 1646   PLT 384 02/18/2020 1646   MCV 89.8 02/18/2020 1646   NEUTROABS 14.2 (H) 02/18/2020 1646   LYMPHSABS 1.6 02/18/2020 1646   MONOABS 2.7 (H) 02/18/2020 1646   EOSABS 0.0 02/18/2020 1646   BASOSABS 0.1 02/18/2020 1646    Recent Results (from the past 240 hour(s))  SARS Coronavirus 2 by RT PCR (hospital order, performed in [facility] hospital lab) Nasopharyngeal Nasopharyngeal Swab     Status: None   Collection Time: 02/18/20  7:55 PM   Specimen: Nasopharyngeal Swab  Result Value Ref Range Status   SARS Coronavirus 2 NEGATIVE NEGATIVE Final    Comment: (NOTE) SARS-CoV-2 target nucleic acids are NOT DETECTED.  The SARS-CoV-2 RNA is generally detectable in upper and lower respiratory specimens during the acute phase of infection. The lowest  concentration of SARS-CoV-2 viral copies this assay can detect is 250 copies / mL. A negative result does not preclude SARS-CoV-2 infection and should not be used as the sole basis for treatment or other patient management decisions.  A negative result may occur with improper specimen collection / handling, submission of specimen other than nasopharyngeal swab, presence of viral mutation(s) within the areas targeted by this assay, and inadequate number of viral copies (<250 copies / mL).  A negative result must be combined with clinical observations, patient history, and epidemiological information.  Fact Sheet for Patients:   BoilerBrush.com.cy  Fact Sheet for Healthcare Providers: https://pope.com/  This test is not yet approved or  cleared by the [place] FDA and has been authorized for detection and/or diagnosis of SARS-CoV-2 by FDA under an Emergency Use Authorization (EUA).  This EUA will remain in effect (meaning this test can be used) for the duration of the COVID-19 declaration under Section 564(b)(1) of the Act, 21 U.S.C. section 360bbb-3(b)(1), unless the authorization is terminated or revoked sooner.  Performed at [facility], [address], [place], [place] [address]     Studies/Results: DG Chest 2 View  Result Date: 02/18/2020 CLINICAL DATA:  Low O2 sats.  Sickle cell. EXAM: CHEST - 2 VIEW COMPARISON:  02/17/2020 FINDINGS: The heart size and mediastinal contours are within normal limits. Both lungs are clear. The visualized skeletal structures are unremarkable. IMPRESSION: Normal study. Electronically Signed   By: Charlett Nose M.D.   On: 02/18/2020 17:16    Medications: Scheduled Meds: . enoxaparin (LOVENOX) injection  40 mg Subcutaneous Q24H  . folic acid  1 mg Oral QHS  . HYDROmorphone   Intravenous Q4H  . hydroxyurea  1,500 mg Oral QHS  . senna-docusate  1 tablet Oral BID   Continuous Infusions: . sodium chloride 1,000 mL (02/19/20 0751)  . diphenhydrAMINE     PRN Meds:.acetaminophen **OR** acetaminophen, diphenhydrAMINE **OR** diphenhydrAMINE, ketorolac, naloxone **AND** sodium chloride flush, ondansetron **OR** ondansetron (ZOFRAN) IV, polyethylene glycol  Assessment/Plan: Principal Problem:   Sickle cell pain crisis (HCC) Active Problems:   Leukocytosis  1. Hb Sickle Cell Disease with crisis: Continue IVF D5 .45% Saline @ 125 mls/hour, increase the setting on  weight based Dilaudid PCA to 0.5/10/3, continue IV Toradol 15 mg Q 6 H for total of 5 days, Monitor vitals very closely, Re-evaluate pain scale regularly, 2 L of Oxygen by [place]. 2. Leukocytosis: WBC count is up to 18 today, but there is no fever or any other focal point of infection. This is most likely reactive to acute sickle cell crisis, will continue to observe without antibiotics. 3. Sickle Cell Anemia: Hemoglobin is stable at baseline, 10.4 on admission.  There is no clinical indication for blood transfusion today.  We will continue to monitor and repeat labs in a.m. 4. Chronic pain Syndrome: We will treat acute pain crisis for now and wean patient to p.o. home medications when there is significant relief.  Code Status: Full Code Family Communication: N/A Disposition Plan: Not yet ready for discharge  Matthew Frederick  If 7PM-7AM, please contact night-coverage.  02/19/2020, 2:18 PM  LOS: 1 day

## 2020-02-20 ENCOUNTER — Inpatient Hospital Stay (HOSPITAL_COMMUNITY): Payer: Medicaid Other

## 2020-02-20 ENCOUNTER — Telehealth: Payer: Self-pay | Admitting: *Deleted

## 2020-02-20 DIAGNOSIS — D5700 Hb-SS disease with crisis, unspecified: Secondary | ICD-10-CM | POA: Diagnosis not present

## 2020-02-20 DIAGNOSIS — M25561 Pain in right knee: Secondary | ICD-10-CM | POA: Diagnosis not present

## 2020-02-20 LAB — COMPREHENSIVE METABOLIC PANEL
ALT: 15 U/L (ref 0–44)
AST: 21 U/L (ref 15–41)
Albumin: 3.3 g/dL — ABNORMAL LOW (ref 3.5–5.0)
Alkaline Phosphatase: 71 U/L (ref 38–126)
Anion gap: 7 (ref 5–15)
BUN: 10 mg/dL (ref 6–20)
CO2: 26 mmol/L (ref 22–32)
Calcium: 8.6 mg/dL — ABNORMAL LOW (ref 8.9–10.3)
Chloride: 98 mmol/L (ref 98–111)
Creatinine, Ser: 0.69 mg/dL (ref 0.61–1.24)
GFR calc Af Amer: >60 mL/min (ref >60)
GFR calc non Af Amer: >60 mL/min (ref >60)
Glucose, Bld: 95 mg/dL (ref 70–99)
Potassium: 3.7 mmol/L (ref 3.5–5.1)
Sodium: 131 mmol/L — ABNORMAL LOW (ref 135–145)
Total Bilirubin: 4.1 mg/dL — ABNORMAL HIGH (ref 0.3–1.2)
Total Protein: 6.5 g/dL (ref 6.5–8.1)

## 2020-02-20 LAB — CBC WITH DIFFERENTIAL/PLATELET
Abs Immature Granulocytes: 0.06 10*3/uL (ref 0.00–0.07)
Basophils Absolute: 0.1 10*3/uL (ref 0.0–0.1)
Basophils Relative: 1 %
Eosinophils Absolute: 0.2 10*3/uL (ref 0.0–0.5)
Eosinophils Relative: 2 %
HCT: 23.4 % — ABNORMAL LOW (ref 39.0–52.0)
Hemoglobin: 8.4 g/dL — ABNORMAL LOW (ref 13.0–17.0)
Immature Granulocytes: 0 %
Lymphocytes Relative: 18 %
Lymphs Abs: 2.6 10*3/uL (ref 0.7–4.0)
MCH: 32.7 pg (ref 26.0–34.0)
MCHC: 35.9 g/dL (ref 30.0–36.0)
MCV: 91.1 fL (ref 80.0–100.0)
Monocytes Absolute: 2.2 10*3/uL — ABNORMAL HIGH (ref 0.1–1.0)
Monocytes Relative: 16 %
Neutro Abs: 9.1 10*3/uL — ABNORMAL HIGH (ref 1.7–7.7)
Neutrophils Relative %: 63 %
Platelets: 324 10*3/uL (ref 150–400)
RBC: 2.57 MIL/uL — ABNORMAL LOW (ref 4.22–5.81)
RDW: 18.6 % — ABNORMAL HIGH (ref 11.5–15.5)
WBC: 14.3 10*3/uL — ABNORMAL HIGH (ref 4.0–10.5)
nRBC: 1.1 % — ABNORMAL HIGH (ref 0.0–0.2)

## 2020-02-20 MED ORDER — OXYCODONE HCL 5 MG PO TABS
10.0000 mg | ORAL_TABLET | ORAL | Status: DC | PRN
  Administered 2020-02-21: 10 mg via ORAL
  Filled 2020-02-20: qty 2

## 2020-02-20 MED ORDER — HYDROMORPHONE 1 MG/ML IV SOLN
INTRAVENOUS | Status: DC
  Administered 2020-02-20: 30 mg via INTRAVENOUS
  Administered 2020-02-20: 4.5 mg via INTRAVENOUS
  Administered 2020-02-21 (×2): 0 mg via INTRAVENOUS
  Filled 2020-02-20: qty 30

## 2020-02-20 NOTE — Telephone Encounter (Signed)
Pt  [facility] ED on 02/17/20. Pt admitted to [facility] 02/18/20. Order placed for Care Coordination since pt is admitted as inpatient.  Burnard Bunting, RN, BSN, CCRN [facility] [phone]

## 2020-02-20 NOTE — Progress Notes (Signed)
Subjective: Matthew Frederick is an 19 year old male with a medical history significant for sickle cell disease, chronic pain syndrome, and anemia of chronic disease was admitted for sickle cell pain crisis.  Patient states that pain intensity has improved some overnight.  He rates pain as 5/10 primarily to right knee.  Patient is concerned about right knee pain and swelling, he says that this is different from typical sickle cell pain crisis.  He has no history of trauma.  He denies any fever, headache, chest pain, urinary symptoms, nausea, vomiting, or diarrhea.  Objective:  Vital signs in last 24 hours:  Vitals:   02/20/20 0545 02/20/20 0800 02/20/20 0941 02/20/20 1507  BP: 116/68  116/75 109/69  Pulse: 66  67 73  Resp: 14 16 17 12   Temp: 98.9 F (37.2 C)  99.8 F (37.7 C) 98.1 F (36.7 C)  TempSrc: Oral  Oral Oral  SpO2: 100% 100% 99% 96%  Weight:        Intake/Output from previous day:   Intake/Output Summary (Last 24 hours) at 02/20/2020 1718 Last data filed at 02/20/2020 1508 Gross per 24 hour  Intake --  Output 1400 ml  Net -1400 ml    Physical Exam: General: Alert, awake, oriented x3, in no acute distress.  HEENT: [place]/AT PEERL, EOMI Neck: Trachea midline,  no masses, no thyromegal,y no JVD, no carotid bruit OROPHARYNX:  Moist, No exudate/ erythema/lesions.  Heart: Regular rate and rhythm, without murmurs, rubs, gallops, PMI non-displaced, no heaves or thrills on palpation.  Lungs: Clear to auscultation, no wheezing or rhonchi noted. No increased vocal fremitus resonant to percussion  Abdomen: Soft, nontender, nondistended, positive bowel sounds, no masses no hepatosplenomegaly noted..  Neuro: No focal neurological deficits noted cranial nerves II through XII grossly intact. DTRs 2+ bilaterally upper and lower extremities. Strength 5 out of 5 in bilateral upper and lower extremities. Musculoskeletal: No warm swelling or erythema around joints, no spinal tenderness  noted. Psychiatric: Patient alert and oriented x3, good insight and cognition, good recent to remote recall. Lymph node survey: No cervical axillary or inguinal lymphadenopathy noted.  Lab Results:  Basic Metabolic Panel:    Component Value Date/Time   NA 131 (L) 02/20/2020 0541   K 3.7 02/20/2020 0541   CL 98 02/20/2020 0541   CO2 26 02/20/2020 0541   BUN 10 02/20/2020 0541   CREATININE 0.69 02/20/2020 0541   GLUCOSE 95 02/20/2020 0541   CALCIUM 8.6 (L) 02/20/2020 0541   CBC:    Component Value Date/Time   WBC 14.3 (H) 02/20/2020 0541   HGB 8.4 (L) 02/20/2020 0541   HCT 23.4 (L) 02/20/2020 0541   PLT 324 02/20/2020 0541   MCV 91.1 02/20/2020 0541   NEUTROABS 9.1 (H) 02/20/2020 0541   LYMPHSABS 2.6 02/20/2020 0541   MONOABS 2.2 (H) 02/20/2020 0541   EOSABS 0.2 02/20/2020 0541   BASOSABS 0.1 02/20/2020 0541    Recent Results (from the past 240 hour(s))  SARS Coronavirus 2 by RT PCR (hospital order, performed in [facility] hospital lab) Nasopharyngeal Nasopharyngeal Swab     Status: None   Collection Time: 02/18/20  7:55 PM   Specimen: Nasopharyngeal Swab  Result Value Ref Range Status   SARS Coronavirus 2 NEGATIVE NEGATIVE Final    Comment: (NOTE) SARS-CoV-2 target nucleic acids are NOT DETECTED.  The SARS-CoV-2 RNA is generally detectable in upper and lower respiratory specimens during the acute phase of infection. The lowest concentration of SARS-CoV-2 viral copies this assay can detect  is 250 copies / mL. A negative result does not preclude SARS-CoV-2 infection and should not be used as the sole basis for treatment or other patient management decisions.  A negative result may occur with improper specimen collection / handling, submission of specimen other than nasopharyngeal swab, presence of viral mutation(s) within the areas targeted by this assay, and inadequate number of viral copies (<250 copies / mL). A negative result must be combined with  clinical observations, patient history, and epidemiological information.  Fact Sheet for Patients:   BoilerBrush.com.cy  Fact Sheet for Healthcare Providers: https://pope.com/  This test is not yet approved or  cleared by the [place] FDA and has been authorized for detection and/or diagnosis of SARS-CoV-2 by FDA under an Emergency Use Authorization (EUA).  This EUA will remain in effect (meaning this test can be used) for the duration of the COVID-19 declaration under Section 564(b)(1) of the Act, 21 U.S.C. section 360bbb-3(b)(1), unless the authorization is terminated or revoked sooner.  Performed at [facility], [address], [place], [place] [address]     Studies/Results: DG Knee 1-2 Views Right  Result Date: 02/20/2020 CLINICAL DATA:  Medial right knee pain. Pain for 3 days. History of sickle cell. EXAM: RIGHT KNEE - 1-2 VIEW COMPARISON:  None. FINDINGS: No evidence of fracture. The growth plates are nearly fused. Normal alignment and joint spaces. No radiographic evidence of avascular necrosis or focal bone infarct. No significant joint effusion. No periosteal reaction IMPRESSION: Negative radiographs of the right knee. Electronically Signed   By: Narda Rutherford M.D.   On: 02/20/2020 16:40    Medications: Scheduled Meds: . enoxaparin (LOVENOX) injection  40 mg Subcutaneous Q24H  . folic acid  1 mg Oral QHS  . HYDROmorphone   Intravenous Q4H  . hydroxyurea  1,500 mg Oral QHS  . senna-docusate  1 tablet Oral BID   Continuous Infusions: . sodium chloride 50 mL/hr at 02/20/20 0835  . diphenhydrAMINE     PRN Meds:.acetaminophen **OR** acetaminophen, diphenhydrAMINE **OR** diphenhydrAMINE, ketorolac, naloxone **AND** sodium chloride flush, ondansetron **OR** ondansetron (ZOFRAN) IV, oxyCODONE, polyethylene glycol  Consultants:  None    Procedures:  None  Antibiotics:  None  Assessment/Plan: Principal Problem:   Sickle cell pain crisis (HCC) Active Problems:   Leukocytosis  Sickle cell disease with pain crisis: Decrease IV fluids to KVO.  Weaning IV Dilaudid PCA, settings changed to 0.5 mg, 10-minute lockout, and 1.5 mg/h. Oxycodone 10 mg every 4 hours as needed for severe breakthrough pain Toradol 15 mg every 6 hours for total of 5 days. Monitor vital signs closely, reevaluate pain scale regularly, and supplemental oxygen as needed.  Leukocytosis: Stable.  Trending down.  Patient does not have any signs of infection or inflammation.  Considered to be reactive in response to vaso-occlusive pain crisis.  Continue to observe closely.  CBC in a.m.  Sickle cell anemia: IMA globin stable and consistent with patient's baseline.  There is no clinical indication for blood transfusion on today.  Continue to follow closely.  Chronic pain syndrome: Restart home medications  Right knee pain: No notable swelling or erythema to right knee.  Tender to palpation.  Reviewed right knee x-ray as results become available.  Continue pain management.  Code Status: Full Code Family Communication: N/A Disposition Plan: Not yet ready for discharge  [name]  APRN, MSN, FNP-C [facility] [address] [place], [place] [address] [phone]  If 5PM-8AM, please  contact night-coverage.  02/20/2020, 5:18 PM  LOS: 2 days

## 2020-02-20 NOTE — Progress Notes (Signed)
Pt states is pain is resolved since yesterday just his R knee is painful and ROM is limited in his knee with constant throbbing and sharp pain. Applied heat overnight and will try ice now

## 2020-02-20 NOTE — Plan of Care (Signed)
  Problem: Activity: Goal: Ability to return to normal activity level will improve to the fullest extent possible by discharge Outcome: Progressing   Problem: Education: Goal: Knowledge of medication regimen will be met for pain relief regimen by discharge Outcome: Progressing Goal: Understanding of ways to prevent infection will improve by discharge Outcome: Progressing   Problem: Coping: Goal: Ability to verbalize feelings will improve by discharge Outcome: Progressing Goal: Family members realistic understanding of the patients condition will improve by discharge Outcome: Progressing   Problem: Fluid Volume: Goal: Maintenance of adequate hydration will improve by discharge Outcome: Progressing   Problem: Medication: Goal: Compliance with prescribed medication regimen will improve by discharge Outcome: Progressing   Problem: Physical Regulation: Goal: Hemodynamic stability will return to baseline for the patient by discharge Outcome: Progressing Goal: Diagnostic test results will improve Outcome: Progressing Goal: Will remain free from infection Outcome: Progressing   Problem: Respiratory: Goal: Ability to maintain adequate oxygenation and ventilation will improve by discharge Outcome: Progressing   Problem: Role Relationship: Goal: Ability to identify and utilize available support systems will improve by discharge Outcome: Progressing   Problem: Pain Management: Goal: Satisfaction with pain management regimen will be met by discharge Outcome: Progressing

## 2020-02-21 DIAGNOSIS — D5700 Hb-SS disease with crisis, unspecified: Secondary | ICD-10-CM | POA: Diagnosis not present

## 2020-02-21 NOTE — Discharge Instructions (Signed)
Sickle Cell Anemia, Adult  Sickle cell anemia is a condition where your red blood cells are shaped like sickles. Red blood cells carry oxygen through the body. Sickle-shaped cells do not live as long as normal red blood cells. They also clump together and block blood from flowing through the blood vessels. This prevents the body from getting enough oxygen. Sickle cell anemia causes organ damage and pain. It also increases the risk of infection. Follow these instructions at home: Medicines  Take over-the-counter and prescription medicines only as told by your doctor.  If you were prescribed an antibiotic medicine, take it as told by your doctor. Do not stop taking the antibiotic even if you start to feel better.  If you develop a fever, do not take medicines to lower the fever right away. Tell your doctor about the fever. Managing pain, stiffness, and swelling  Try these methods to help with pain: ? Use a heating pad. ? Take a warm bath. ? Distract yourself, such as by watching TV. Eating and drinking  Drink enough fluid to keep your pee (urine) clear or pale yellow. Drink more in hot weather and during exercise.  Limit or avoid alcohol.  Eat a healthy diet. Eat plenty of fruits, vegetables, whole grains, and lean protein.  Take vitamins and supplements as told by your doctor. Traveling  When traveling, keep these with you: ? Your medical information. ? The names of your doctors. ? Your medicines.  If you need to take an airplane, talk to your doctor first. Activity  Rest often.  Avoid exercises that make your heart beat much faster, such as jogging. General instructions  Do not use products that have nicotine or tobacco, such as cigarettes and e-cigarettes. If you need help quitting, ask your doctor.  Consider wearing a medical alert bracelet.  Avoid being in high places (high altitudes), such as mountains.  Avoid very hot or cold temperatures.  Avoid places where the  temperature changes a lot.  Keep all follow-up visits as told by your doctor. This is important. Contact a doctor if:  A joint hurts.  Your feet or hands hurt or swell.  You feel tired (fatigued). Get help right away if:  You have symptoms of infection. These include: ? Fever. ? Chills. ? Being very tired. ? Irritability. ? Poor eating. ? Throwing up (vomiting).  You feel dizzy or faint.  You have new stomach pain, especially on the left side.  You have a an erection (priapism) that lasts more than 4 hours.  You have numbness in your arms or legs.  You have a hard time moving your arms or legs.  You have trouble talking.  You have pain that does not go away when you take medicine.  You are short of breath.  You are breathing fast.  You have a long-term cough.  You have pain in your chest.  You have a bad headache.  You have a stiff neck.  Your stomach looks bloated even though you did not eat much.  Your skin is pale.  You suddenly cannot see well. Summary  Sickle cell anemia is a condition where your red blood cells are shaped like sickles.  Follow your doctor's advice on ways to manage pain, food to eat, activities to do, and steps to take for safe travel.  Get medical help right away if you have any signs of infection, such as a fever. This information is not intended to replace advice given to you by   your health care provider. Make sure you discuss any questions you have with your health care provider. Document Revised: 10/22/2018 Document Reviewed: 08/05/2016 Elsevier Patient Education  2020 [employer]  

## 2020-02-21 NOTE — Discharge Summary (Signed)
Physician Discharge Summary  Matthew Frederick YHC:[id number] DOB: [birth year] DOA: 02/18/2020  PCP: [name], MD  Admit date: 02/18/2020  Discharge date: 02/22/2020  Discharge Diagnoses:  Principal Problem:   Sickle cell pain crisis [facility]) Active Problems:   Leukocytosis   Discharge Condition: Stable  Disposition:  Pt is discharged home in good condition and is to follow up with [name], MD this week to have labs evaluated. [facility] Marrazzo is instructed to increase activity slowly and balance with rest for the next few days, and use prescribed medication to complete treatment of pain  Diet: Regular Wt Readings from Last 3 Encounters:  02/21/20 59.9 kg (15 %, Z= -1.03)*  02/17/20 59.4 kg (14 %, Z= -1.09)*  01/23/20 60.8 kg (18 %, Z= -0.91)*   * Growth percentiles are based on CDC (Boys, 2-20 Years) data.    History of present illness:  Matthew Frederick is a 19 year old male with a medical history significant for sickle cell anemia who is returning to the emergency department for second day in a row due to bilateral lower extremity pain typical of sickle cell pain crisis.  He denies fever, chills, sore throat, rhinorrhea, cough, wheezing, or hemoptysis.  No chest pain, palpitations, dizziness, diaphoresis, PND, orthopnea, or pitting edema of the lower extremities.  He denies abdominal pain, nausea, vomiting, diarrhea, constipation, melena, or hematochezia.  No dysuria, frequency, or hematuria.  No polyuria, polydipsia, polyphagia, or blurred vision.  ER course: Initial vital signs were temperature 99.2 F, pulse 94, respirations 22, blood pressure 124/84, oxygen saturation 94% on RA.  The patient received IV fluids, Benadryl 25 mg IVP, analgesics (hydromorphine and Toradol) in the emergency department.  He stated he was feeling better.  CBC showed white count of 18.7, hemoglobin 10.4 g/dL, and platelets 517.  Reticulocyte count was 19.8%.  PT was 16.0 and INR 1.3.  CMP  showed a glucose of 108 and total bilirubin of 6.2.  The rest of the CMP values are within expected range.  Coronavirus PCR was negative.  Chest x-ray shows no acute cardiopulmonary pathology.  Hospital Course:  Sickle cell disease with pain crisis: Patient was admitted for sickle cell pain crisis and managed appropriately with IVF, IV Dilaudid via PCA and IV Toradol, as well as other adjunct therapies per sickle cell pain management protocols.  IV Dilaudid PCA was weaned appropriately and patient was transitioned to home medications.  Patient will resume oxycodone 10 mg every 4 hours as needed for severe breakthrough pain.  Also, at home medications consisting of ibuprofen and Tylenol.  Patient advised to follow-up with PCP in 1 week to repeat CBC with differential and CMP.  He is requesting discharge home on today.  His pain intensity is 3/10 primarily to right knee.  He states that he can manage at home on current medication regimen. Patient is aware of all upcoming appointments. He is alert, oriented, and ambulating without assistance.  Patient is afebrile and oxygen saturation is 100% on RA.  Patient was discharged home today in a hemodynamically stable condition.   Discharge Exam: Vitals:   02/21/20 0626 02/21/20 0941  BP: 118/71 112/65  Pulse: 66 81  Resp: 16 16  Temp: 98 F (36.7 C) 98.8 F (37.1 C)  SpO2: 100% 98%   Vitals:   02/21/20 0208 02/21/20 0400 02/21/20 0626 02/21/20 0941  BP: 119/63  118/71 112/65  Pulse: 83  66 81  Resp: 16 16 16 16   Temp: 98 F (36.7 C)  98  F (36.7 C) 98.8 F (37.1 C)  TempSrc: Oral  Tympanic Oral  SpO2: 98% 100% 100% 98%  Weight:   59.9 kg   Height:        General appearance : Awake, alert, not in any distress. Speech Clear. Not toxic looking HEENT: Atraumatic and Normocephalic, pupils equally reactive to light and accomodation Neck: Supple, no JVD. No cervical lymphadenopathy.  Chest: Good air entry bilaterally, no added sounds  CVS: S1  S2 regular, no murmurs.  Abdomen: Bowel sounds present, Non tender and not distended with no gaurding, rigidity or rebound. Extremities: B/L Lower Ext shows no edema, both legs are warm to touch Neurology: Awake alert, and oriented X 3, CN II-XII intact, Non focal Skin: No Rash  Discharge Instructions  Discharge Instructions    Discharge patient   Complete by: As directed    Discharge disposition: 01-Home or Self Care   Discharge patient date: 02/21/2020     Allergies as of 02/21/2020   No Known Allergies     Medication List    TAKE these medications   folic acid 1 MG tablet Commonly known as: FOLVITE Take 1 mg by mouth at bedtime.   hydroxyurea 500 MG capsule Commonly known as: HYDREA Take 1,500 mg by mouth at bedtime.   Oxycodone HCl 10 MG Tabs Take 1 tablet (10 mg total) by mouth every 6 (six) hours as needed. What changed: reasons to take this       The results of significant diagnostics from this hospitalization (including imaging, microbiology, ancillary and laboratory) are listed below for reference.    Significant Diagnostic Studies: DG Chest 2 View  Result Date: 02/18/2020 CLINICAL DATA:  Low O2 sats.  Sickle cell. EXAM: CHEST - 2 VIEW COMPARISON:  02/17/2020 FINDINGS: The heart size and mediastinal contours are within normal limits. Both lungs are clear. The visualized skeletal structures are unremarkable. IMPRESSION: Normal study. Electronically Signed   By: Charlett Nose M.D.   On: 02/18/2020 17:16   DG Chest 2 View  Result Date: 02/17/2020 CLINICAL DATA:  Sickle cell pain crisis EXAM: CHEST - 2 VIEW COMPARISON:  01/21/2020 FINDINGS: The heart size and mediastinal contours are within normal limits. Both lungs are clear. Chronic osseous manifestations of sickle cell disease, similar in appearance to prior. IMPRESSION: No active cardiopulmonary disease. Electronically Signed   By: Duanne Guess D.O.   On: 02/17/2020 11:46   DG Knee 1-2 Views Right  Result  Date: 02/20/2020 CLINICAL DATA:  Medial right knee pain. Pain for 3 days. History of sickle cell. EXAM: RIGHT KNEE - 1-2 VIEW COMPARISON:  None. FINDINGS: No evidence of fracture. The growth plates are nearly fused. Normal alignment and joint spaces. No radiographic evidence of avascular necrosis or focal bone infarct. No significant joint effusion. No periosteal reaction IMPRESSION: Negative radiographs of the right knee. Electronically Signed   By: Narda Rutherford M.D.   On: 02/20/2020 16:40    Microbiology: Recent Results (from the past 240 hour(s))  SARS Coronavirus 2 by RT PCR (hospital order, performed in [facility] hospital lab) Nasopharyngeal Nasopharyngeal Swab     Status: None   Collection Time: 02/18/20  7:55 PM   Specimen: Nasopharyngeal Swab  Result Value Ref Range Status   SARS Coronavirus 2 NEGATIVE NEGATIVE Final    Comment: (NOTE) SARS-CoV-2 target nucleic acids are NOT DETECTED.  The SARS-CoV-2 RNA is generally detectable in upper and lower respiratory specimens during the acute phase of infection. The lowest concentration of SARS-CoV-2  viral copies this assay can detect is 250 copies / mL. A negative result does not preclude SARS-CoV-2 infection and should not be used as the sole basis for treatment or other patient management decisions.  A negative result may occur with improper specimen collection / handling, submission of specimen other than nasopharyngeal swab, presence of viral mutation(s) within the areas targeted by this assay, and inadequate number of viral copies (<250 copies / mL). A negative result must be combined with clinical observations, patient history, and epidemiological information.  Fact Sheet for Patients:   BoilerBrush.com.cy  Fact Sheet for Healthcare Providers: https://pope.com/  This test is not yet approved or  cleared by the [place] FDA and has been authorized for detection and/or  diagnosis of SARS-CoV-2 by FDA under an Emergency Use Authorization (EUA).  This EUA will remain in effect (meaning this test can be used) for the duration of the COVID-19 declaration under Section 564(b)(1) of the Act, 21 U.S.C. section 360bbb-3(b)(1), unless the authorization is terminated or revoked sooner.  Performed at [facility], [address], [place], [place] [address]      Labs: Basic Metabolic Panel: Recent Labs  Lab 02/17/20 1024 02/17/20 1024 02/18/20 1646 02/20/20 0541  NA 135  --  137 131*  K 4.2   < > 4.4 3.7  CL 103  --  101 98  CO2 23  --  29 26  GLUCOSE 135*  --  108* 95  BUN 12  --  8 10  CREATININE 0.76  --  0.65 0.69  CALCIUM 9.3  --  9.1 8.6*   < > = values in this interval not displayed.   Liver Function Tests: Recent Labs  Lab 02/17/20 1024 02/18/20 1646 02/20/20 0541  AST 40 34 21  ALT 19 18 15   ALKPHOS 102 97 71  BILITOT 4.4* 6.2* 4.1*  PROT 8.3* 8.1 6.5  ALBUMIN 4.7 4.5 3.3*   No results for input(s): LIPASE, AMYLASE in the last 168 hours. No results for input(s): AMMONIA in the last 168 hours. CBC: Recent Labs  Lab 02/17/20 1024 02/18/20 1646 02/20/20 0541  WBC 17.0* 18.7* 14.3*  NEUTROABS 13.5* 14.2* 9.1*  HGB 9.8* 10.4* 8.4*  HCT 26.5* 28.1* 23.4*  MCV 88.9 89.8 91.1  PLT 408* 384 324   Cardiac Enzymes: No results for input(s): CKTOTAL, CKMB, CKMBINDEX, TROPONINI in the last 168 hours. BNP: Invalid input(s): POCBNP CBG: No results for input(s): GLUCAP in the last 168 hours.  Time coordinating discharge: 35 minutes  Signed:  04/21/20  APRN, MSN, FNP-C Patient Care [facility] [address] [place], [place] [phone]  [facility] 02/22/2020, 5:03 PM

## 2020-02-22 ENCOUNTER — Telehealth: Payer: Self-pay | Admitting: *Deleted

## 2020-02-22 NOTE — Telephone Encounter (Signed)
Medicaid Managed Care team Transition of Care Assessment outreach attempt #1 made today. Unable to reach patient. HIPPA compliant voice message left requesting a return call. The patient has also been enrolled in an automated discharge follow up call series and will receive two outreach attempts for transition of care assessment. Contact information has been left for the patient and the Medicaid Managed Care team is available to provide assistance to the patient at any time.  ° °Katrice Sameera Betton, RN, BSN, CCRN °Patient Engagement Center °[phone] ° °

## 2020-02-23 ENCOUNTER — Other Ambulatory Visit: Payer: Self-pay

## 2020-02-23 NOTE — Patient Outreach (Signed)
Care Coordination  02/23/2020  Aniket Paye Osberg Aug 25, 2000 [id number]    Managed Medicaid: New referral for this patient who was recently closed due to inability to reach.  Placed call to patient with no answer. Left a message requesting a call back.  PLAN: will attend 2nd outreach in 3-4 days. Will mail unsuccessful outreach letter.  [name], RN, BSN, CEN [facility] [phone]

## 2020-02-28 ENCOUNTER — Other Ambulatory Visit: Payer: Self-pay

## 2020-02-28 NOTE — Patient Outreach (Signed)
Care Coordination  02/28/2020  Matthew Frederick 2001/05/17 [id number]   Telephone assessment:  Placed call to patient with no answer.  Left a message requesting a call back.  PLAN: Will outreach patient again in 3 days.   [name], RN, BSN, CEN [facility] [phone]

## 2020-03-02 ENCOUNTER — Other Ambulatory Visit: Payer: Self-pay

## 2020-03-02 NOTE — Patient Outreach (Signed)
Care Coordination  03/02/2020  Matthew Frederick [birth year] [id number]    Managed medicaid:  Placed call to patient with no answer.   PLAN: will place patient back on my schedule for 4 weeks.[name], RN, BSN, CEN [facility] Coordinator [phone]

## 2020-03-08 ENCOUNTER — Emergency Department (HOSPITAL_COMMUNITY)
Admission: EM | Admit: 2020-03-08 | Discharge: 2020-03-08 | Disposition: A | Discharge status: Home / Self Care | Payer: Medicaid Other | Attending: Emergency Medicine | Admitting: Emergency Medicine

## 2020-03-08 ENCOUNTER — Encounter (HOSPITAL_COMMUNITY): Payer: Self-pay

## 2020-03-08 DIAGNOSIS — R52 Pain, unspecified: Secondary | ICD-10-CM | POA: Diagnosis present

## 2020-03-08 DIAGNOSIS — D5700 Hb-SS disease with crisis, unspecified: Secondary | ICD-10-CM | POA: Diagnosis not present

## 2020-03-08 DIAGNOSIS — Z79899 Other long term (current) drug therapy: Secondary | ICD-10-CM | POA: Insufficient documentation

## 2020-03-08 DIAGNOSIS — D57219 Sickle-cell/Hb-C disease with crisis, unspecified: Secondary | ICD-10-CM | POA: Diagnosis not present

## 2020-03-08 DIAGNOSIS — R0902 Hypoxemia: Secondary | ICD-10-CM | POA: Diagnosis not present

## 2020-03-08 LAB — CBC WITH DIFFERENTIAL/PLATELET
Abs Immature Granulocytes: 0.35 10*3/uL — ABNORMAL HIGH (ref 0.00–0.07)
Basophils Absolute: 0.1 10*3/uL (ref 0.0–0.1)
Basophils Relative: 1 %
Eosinophils Absolute: 0 10*3/uL (ref 0.0–0.5)
Eosinophils Relative: 0 %
HCT: 27.8 % — ABNORMAL LOW (ref 39.0–52.0)
Hemoglobin: 10.2 g/dL — ABNORMAL LOW (ref 13.0–17.0)
Immature Granulocytes: 2 %
Lymphocytes Relative: 8 %
Lymphs Abs: 1.4 10*3/uL (ref 0.7–4.0)
MCH: 33 pg (ref 26.0–34.0)
MCHC: 36.7 g/dL — ABNORMAL HIGH (ref 30.0–36.0)
MCV: 90 fL (ref 80.0–100.0)
Monocytes Absolute: 2.4 10*3/uL — ABNORMAL HIGH (ref 0.1–1.0)
Monocytes Relative: 14 %
Neutro Abs: 13.5 10*3/uL — ABNORMAL HIGH (ref 1.7–7.7)
Neutrophils Relative %: 75 %
Platelets: 649 10*3/uL — ABNORMAL HIGH (ref 150–400)
RBC: 3.09 MIL/uL — ABNORMAL LOW (ref 4.22–5.81)
RDW: 20.1 % — ABNORMAL HIGH (ref 11.5–15.5)
WBC: 17.8 10*3/uL — ABNORMAL HIGH (ref 4.0–10.5)
nRBC: 3 % — ABNORMAL HIGH (ref 0.0–0.2)

## 2020-03-08 LAB — COMPREHENSIVE METABOLIC PANEL
ALT: 15 U/L (ref 0–44)
AST: 30 U/L (ref 15–41)
Albumin: 4.1 g/dL (ref 3.5–5.0)
Alkaline Phosphatase: 102 U/L (ref 38–126)
Anion gap: 9 (ref 5–15)
BUN: 8 mg/dL (ref 6–20)
CO2: 22 mmol/L (ref 22–32)
Calcium: 8.8 mg/dL — ABNORMAL LOW (ref 8.9–10.3)
Chloride: 104 mmol/L (ref 98–111)
Creatinine, Ser: 0.65 mg/dL (ref 0.61–1.24)
GFR calc Af Amer: >60 mL/min (ref >60)
GFR calc non Af Amer: >60 mL/min (ref >60)
Glucose, Bld: 119 mg/dL — ABNORMAL HIGH (ref 70–99)
Potassium: 3.6 mmol/L (ref 3.5–5.1)
Sodium: 135 mmol/L (ref 135–145)
Total Bilirubin: 4.4 mg/dL — ABNORMAL HIGH (ref 0.3–1.2)
Total Protein: 7.6 g/dL (ref 6.5–8.1)

## 2020-03-08 LAB — RETICULOCYTES
Immature Retic Fract: 32.8 % — ABNORMAL HIGH (ref 2.3–15.9)
RBC.: 3.07 MIL/uL — ABNORMAL LOW (ref 4.22–5.81)
Retic Count, Absolute: 396.5 10*3/uL — ABNORMAL HIGH (ref 19.0–186.0)
Retic Ct Pct: 13 % — ABNORMAL HIGH (ref 0.4–3.1)

## 2020-03-08 MED ORDER — HYDROMORPHONE HCL 2 MG/ML IJ SOLN
2.0000 mg | Freq: Once | INTRAMUSCULAR | Status: AC
  Administered 2020-03-08: 2 mg via INTRAVENOUS
  Filled 2020-03-08: qty 1

## 2020-03-08 MED ORDER — HYDROMORPHONE HCL 2 MG/ML IJ SOLN
2.0000 mg | Freq: Once | INTRAMUSCULAR | Status: DC
  Filled 2020-03-08: qty 1

## 2020-03-08 MED ORDER — ONDANSETRON HCL 4 MG/2ML IJ SOLN
4.0000 mg | Freq: Once | INTRAMUSCULAR | Status: AC
  Administered 2020-03-08: 4 mg via INTRAVENOUS
  Filled 2020-03-08: qty 2

## 2020-03-08 MED ORDER — DEXTROSE-NACL 5-0.45 % IV SOLN: INTRAVENOUS | Status: DC

## 2020-03-08 MED ORDER — KETOROLAC TROMETHAMINE 30 MG/ML IJ SOLN
30.0000 mg | Freq: Once | INTRAMUSCULAR | Status: AC
  Administered 2020-03-08: 30 mg via INTRAVENOUS
  Filled 2020-03-08: qty 1

## 2020-03-08 NOTE — ED Triage Notes (Signed)
Pt presents with c/o SSC. Pt reports the pain is all over his body. Pt has an IV in place from EMS, of Fentanyl given. VS for EMS BP 124/66, 80 HR, 24R, 98%.

## 2020-03-08 NOTE — Discharge Instructions (Addendum)
Please follow-up with your hematologist regarding today's encounter.  Please continue take your at home medications, as directed.  Your laboratory work-up and exam here today was reassuring.  I suspect that you had a sickle cell crisis, consistent with a history of sickle cell crises.    Return to the ED or seek immediate medical attention should you experience any new or worsening symptoms.  You were given narcotic and or sedative medications while in the emergency department. Do not drive. Do not use machinery or power tools. Do not sign legal documents. Do not drink alcohol. Do not take sleeping pills. Do not supervise children by yourself. Do not participate in activities that require climbing or being in high places.

## 2020-03-08 NOTE — ED Provider Notes (Signed)
[facility]-EMERGENCY DEPT Provider Note   CSN: [id number] Arrival date & time: 03/08/20  1130     History Chief Complaint  Patient presents with  . Sickle Cell Pain Crisis    Matthew Frederick is a 19 y.o. male with past medical history significant for sickle cell anemia who presents to the ED with complaints of sickle cell crisis.  Patient takes oxycodone 10 mg every 4 hours as needed for severe breakthrough pain if unrelieved by ibuprofen and Tylenol.  He is on hydroxyurea in effort to reduce frequency of his crises.  He is followed by Dr. Sherryll Burger, hematology at [facility].    Patient reports that his pain symptoms began today.  He endorses 10 out of 10 pain "all over" but adamantly denies any chest pain or shortness of breath symptoms.  Patient states this feels consistent with his prior episodes of sickle cell crises.  Patient received 100 mcg fentanyl in route by EMS.  He also denies any recent illness, fevers or chills, cough, nausea or vomiting, or other symptoms.  HPI     Past Medical History:  Diagnosis Date  . Sickle cell anemia [facility])     Patient Active Problem List   Diagnosis Date Noted  . Sickle cell anemia with crisis (HCC) 01/21/2020  . Anemia   . Sickle-cell crisis (HCC) 09/03/2019  . Sickle cell anemia with pain (HCC) 08/05/2019  . Abnormal liver function 04/02/2019  . Thrombocytopenia (HCC) 11/24/2018  . Leukocytosis 11/22/2018  . Chest pain   . Coccyx pain   . Sickle cell pain crisis (HCC)   . Fever   . Sickle cell crisis (HCC) 07/23/2016  . Transition of care performed with sharing of clinical summary 04/28/2016  . Need for immunization against influenza 04/22/2012    History reviewed. No pertinent surgical history.     Family History  Problem Relation Age of Onset  . Sickle cell anemia Father   . Diabetes Maternal Grandmother     Social History   Tobacco Use  . Smoking status: Never Smoker  . Smokeless tobacco: Never Used   Vaping Use  . Vaping Use: Never used  Substance Use Topics  . Alcohol use: No  . Drug use: No    Home Medications Prior to Admission medications   Medication Sig Start Date End Date Taking? Authorizing Provider  folic acid (FOLVITE) 1 MG tablet Take 1 mg by mouth at bedtime.    Yes [provider]  HYDROcodone-acetaminophen (NORCO/VICODIN) 5-325 MG tablet Take 1 tablet by mouth every 6 (six) hours as needed for moderate pain or severe pain.   Yes [provider]  hydroxyurea (HYDREA) 500 MG capsule Take 1,500 mg by mouth at bedtime.    Yes [provider]  Oxycodone HCl 10 MG TABS Take 1 tablet (10 mg total) by mouth every 6 (six) hours as needed. Patient taking differently: Take 10 mg by mouth every 6 (six) hours as needed (pain).  12/05/19  Yes Massie Maroon, FNP    Allergies    Patient has no known allergies.  Review of Systems   Review of Systems  All other systems reviewed and are negative.   Physical Exam Updated Vital Signs BP 110/60 (BP Location: Right Arm)   Pulse 76   Temp 99.9 F (37.7 C) (Oral)   Resp 10   SpO2 91%   Physical Exam Vitals and nursing note reviewed. Exam conducted with a chaperone present.  HENT:  Head: Normocephalic and atraumatic.  Eyes:     General: No scleral icterus.    Conjunctiva/sclera: Conjunctivae normal.  Cardiovascular:     Rate and Rhythm: Normal rate and regular rhythm.     Pulses: Normal pulses.     Heart sounds: Normal heart sounds.  Pulmonary:     Effort: Pulmonary effort is normal. No respiratory distress.     Breath sounds: Normal breath sounds. No stridor. No wheezing or rales.  Abdominal:     Comments: Soft, nondistended.  No significant TTP.  No guarding.  Musculoskeletal:     Comments: Moves all extremities.  Strength is intact against resistance.  Skin:    General: Skin is dry.     Capillary Refill: Capillary refill takes less than 2 seconds.  Neurological:     Mental Status:  He is alert.     GCS: GCS eye subscore is 4. GCS verbal subscore is 5. GCS motor subscore is 6.  Psychiatric:        Mood and Affect: Mood normal.        Behavior: Behavior normal.        Thought Content: Thought content normal.     ED Results / Procedures / Treatments   Labs (all labs ordered are listed, but only abnormal results are displayed) Labs Reviewed  COMPREHENSIVE METABOLIC PANEL - Abnormal; Notable for the following components:      Result Value   Glucose, Bld 119 (*)    Calcium 8.8 (*)    Total Bilirubin 4.4 (*)    All other components within normal limits  CBC WITH DIFFERENTIAL/PLATELET - Abnormal; Notable for the following components:   WBC 17.8 (*)    RBC 3.09 (*)    Hemoglobin 10.2 (*)    HCT 27.8 (*)    MCHC 36.7 (*)    RDW 20.1 (*)    Platelets 649 (*)    nRBC 3.0 (*)    Neutro Abs 13.5 (*)    Monocytes Absolute 2.4 (*)    Abs Immature Granulocytes 0.35 (*)    All other components within normal limits  RETICULOCYTES - Abnormal; Notable for the following components:   Retic Ct Pct 13.0 (*)    RBC. 3.07 (*)    Retic Count, Absolute 396.5 (*)    Immature Retic Fract 32.8 (*)    All other components within normal limits    EKG None  Radiology No results found.  Procedures Procedures (including critical care time)  Medications Ordered in ED Medications  dextrose 5 %-0.45 % sodium chloride infusion ( Intravenous New Bag/Given 03/08/20 1407)  HYDROmorphone (DILAUDID) injection 2 mg (0 mg Intravenous Hold 03/08/20 1631)  ketorolac (TORADOL) 30 MG/ML injection 30 mg (30 mg Intravenous Given 03/08/20 1402)  ondansetron (ZOFRAN) injection 4 mg (4 mg Intravenous Given 03/08/20 1402)  HYDROmorphone (DILAUDID) injection 2 mg (2 mg Intravenous Given 03/08/20 1403)  HYDROmorphone (DILAUDID) injection 2 mg (2 mg Intravenous Given 03/08/20 1503)    ED Course  I have reviewed the triage vital signs and the nursing notes.  Pertinent labs & imaging results that  were available during my care of the patient were reviewed by me and considered in my medical decision making (see chart for details).    MDM Rules/Calculators/A&P                          Patient presents to the ED with complaints of sickle cell crisis.  Laboratory work-up  was obtained and consistent with crisis given elevated reticulocyte and reticulocyte count percentage.  Mild anemia with hemoglobin 10.2, however improved from baseline.  CMP reviewed and largely unremarkable.  Elevated total bilirubin of 4.4, but consistent if not improved from baseline.  CBC with leukocytosis to 17.8, also consistent with baseline.  Patient denies any fevers or chills at home or other symptoms to suggest infection.  On subsequent evaluation after patient was treated with Toradol and 2 mg Dilaudid, patient was feeling much improved.  He is now rating his generalized pain symptoms as 8 out of 10 rather than 10 out of 10.  He has mild bony tenderness over right knee and shoulders bilaterally, but no other significant tenderness on my exam.  No abdominal TTP.  Reassessed right upper quadrant and no pain sx.    On following examination, patient still endorses 7 out of 10 pain and does not feel prepared for discharge.  Will administer one final round of analgesics.  Patient still feels improved and capable of going home on home medication, will consult for admission.  On final examination, patient states that his pain is down to a 4 out of 10.  He feels well to go home and attempt to manage his symptoms with his at home medications.  He feels reasonable for discharge and declined option for admission for continued pain control.  All of the evaluation and work-up results were discussed with the patient and any family at bedside.  Patient and/or family were informed that while patient is appropriate for discharge at this time, some medical emergencies may only develop or become detectable after a period of time.  I  specifically instructed patient and/or family to return to return to the ED or seek immediate medical attention for any new or worsening symptoms.  They were provided opportunity to ask any additional questions and have none at this time.  Prior to discharge patient is feeling well, agreeable with plan for discharge home.  They have expressed understanding of verbal discharge instructions as well as return precautions and are agreeable to the plan.   Patient counseled to never drive or operate heavy machinery while taking narcotic or other sedating medication.  Final Clinical Impression(s) / ED Diagnoses Final diagnoses:  Sickle cell pain crisis [facility])    Rx / DC Orders ED Discharge Orders    None       Lorelee New, PA-C 03/08/20 1651    Tegeler, Canary Brim, MD 03/08/20 [phone]

## 2020-03-08 NOTE — ED Notes (Signed)
Pt sats dropped to 84% room air. Good waveform noted. Placed pt on 2L O2 Nasal Cannula. Sats at 96% with good waveform noted at this time.

## 2020-03-09 ENCOUNTER — Telehealth: Payer: Self-pay | Admitting: *Deleted

## 2020-03-09 NOTE — Telephone Encounter (Signed)
Called Dr Tedra Sen[place] office to request a ED follow ,spoke with Veva Holes, she agreed to call the patient .                                                     Natalyia Innes                                                        PEC                                                   [phone]

## 2020-03-12 ENCOUNTER — Telehealth: Payer: Self-pay | Admitting: *Deleted

## 2020-03-12 NOTE — Telephone Encounter (Signed)
Transition Care Management Unsuccessful Follow-up Telephone Call  Date of discharge and from where: [facility], 03/08/20  Attempts:  1st Attempt  Reason for unsuccessful TCM follow-up call:  Left voice message.  Burnard Bunting, RN, BSN, CCRN Patient Engagement Center [phone]

## 2020-03-16 ENCOUNTER — Telehealth: Payer: Self-pay | Admitting: *Deleted

## 2020-03-16 NOTE — Telephone Encounter (Signed)
Transition Care Management Unsuccessful Follow-up Telephone Call  Date of discharge and from where:  03/08/20, [facility]  Attempts:  2nd Attempt  Reason for unsuccessful TCM follow-up call:  Left voice message.  Burnard Bunting, RN, BSN, CCRN Patient Engagement Center [phone]

## 2020-04-02 ENCOUNTER — Other Ambulatory Visit: Payer: Self-pay

## 2020-04-02 NOTE — Patient Outreach (Signed)
Care Coordination  04/02/2020  Matthew Frederick 2001/06/27 [id number]   Case closure:  Placed call to patient for 4th outreach attempt that was unsuccessful. No answer.  PLAN: Close case as unable to reach.  [name], RN, BSN, CEN [facility] [phone]

## 2020-04-20 ENCOUNTER — Other Ambulatory Visit: Payer: Self-pay | Admitting: *Deleted

## 2020-04-20 NOTE — Patient Outreach (Signed)
Care Coordination  04/20/2020  Jentzen Minasyan Deharo 2001-01-31 [id number]   An unsuccessful telephone outreach was attempted today. The patient was referred to the case management team for assistance with care management and care coordination.   Follow Up Plan: A HIPAA compliant phone message was left for the patient providing contact information and requesting a return call.  The Managed Medicaid care management team will reach out to the patient again over the next 7-14 days.   Estanislado Emms RN, BSN [place]  [facility]

## 2020-04-20 NOTE — Patient Instructions (Signed)
Visit Information  Mr. Matthew Frederick  - as a part of your Medicaid benefit, you are eligible for care management and care coordination services at no cost or copay. I was unable to reach you by phone today but would be happy to help you with your health related needs. Please feel free to call me @ [phone].   A member of the Managed Medicaid care management team will reach out to you again over the next 7-14 days.   Estanislado Emms RN, BSN [place]  [facility]

## 2020-05-07 ENCOUNTER — Other Ambulatory Visit: Payer: Self-pay

## 2020-05-26 ENCOUNTER — Other Ambulatory Visit: Payer: Self-pay

## 2020-05-26 ENCOUNTER — Emergency Department (HOSPITAL_COMMUNITY)
Admission: EM | Admit: 2020-05-26 | Discharge: 2020-05-26 | Disposition: A | Discharge status: Home / Self Care | Payer: Medicaid Other | Source: Home / Self Care | Attending: Emergency Medicine | Admitting: Emergency Medicine

## 2020-05-26 ENCOUNTER — Encounter (HOSPITAL_COMMUNITY): Payer: Self-pay

## 2020-05-26 DIAGNOSIS — R519 Headache, unspecified: Secondary | ICD-10-CM

## 2020-05-26 DIAGNOSIS — D5700 Hb-SS disease with crisis, unspecified: Secondary | ICD-10-CM | POA: Insufficient documentation

## 2020-05-26 DIAGNOSIS — Z79899 Other long term (current) drug therapy: Secondary | ICD-10-CM | POA: Insufficient documentation

## 2020-05-26 LAB — COMPREHENSIVE METABOLIC PANEL
ALT: 17 U/L (ref 0–44)
AST: 30 U/L (ref 15–41)
Albumin: 4.3 g/dL (ref 3.5–5.0)
Alkaline Phosphatase: 105 U/L (ref 38–126)
Anion gap: 12 (ref 5–15)
BUN: 10 mg/dL (ref 6–20)
CO2: 24 mmol/L (ref 22–32)
Calcium: 9.2 mg/dL (ref 8.9–10.3)
Chloride: 102 mmol/L (ref 98–111)
Creatinine, Ser: 0.67 mg/dL (ref 0.61–1.24)
GFR, Estimated: >60 mL/min (ref >60)
Glucose, Bld: 104 mg/dL — ABNORMAL HIGH (ref 70–99)
Potassium: 3.7 mmol/L (ref 3.5–5.1)
Sodium: 138 mmol/L (ref 135–145)
Total Bilirubin: 5.4 mg/dL — ABNORMAL HIGH (ref 0.3–1.2)
Total Protein: 8 g/dL (ref 6.5–8.1)

## 2020-05-26 LAB — CBC WITH DIFFERENTIAL/PLATELET
Abs Immature Granulocytes: 0.25 10*3/uL — ABNORMAL HIGH (ref 0.00–0.07)
Basophils Absolute: 0.2 10*3/uL — ABNORMAL HIGH (ref 0.0–0.1)
Basophils Relative: 1 %
Eosinophils Absolute: 0.2 10*3/uL (ref 0.0–0.5)
Eosinophils Relative: 1 %
HCT: 30.2 % — ABNORMAL LOW (ref 39.0–52.0)
Hemoglobin: 10.8 g/dL — ABNORMAL LOW (ref 13.0–17.0)
Immature Granulocytes: 2 %
Lymphocytes Relative: 19 %
Lymphs Abs: 3.1 10*3/uL (ref 0.7–4.0)
MCH: 31.9 pg (ref 26.0–34.0)
MCHC: 35.8 g/dL (ref 30.0–36.0)
MCV: 89.1 fL (ref 80.0–100.0)
Monocytes Absolute: 2.7 10*3/uL — ABNORMAL HIGH (ref 0.1–1.0)
Monocytes Relative: 16 %
Neutro Abs: 10 10*3/uL — ABNORMAL HIGH (ref 1.7–7.7)
Neutrophils Relative %: 61 %
Platelets: 552 10*3/uL — ABNORMAL HIGH (ref 150–400)
RBC: 3.39 MIL/uL — ABNORMAL LOW (ref 4.22–5.81)
RDW: 21.5 % — ABNORMAL HIGH (ref 11.5–15.5)
WBC: 16.5 10*3/uL — ABNORMAL HIGH (ref 4.0–10.5)
nRBC: 2.5 % — ABNORMAL HIGH (ref 0.0–0.2)

## 2020-05-26 LAB — RETICULOCYTES
Immature Retic Fract: 29.2 % — ABNORMAL HIGH (ref 2.3–15.9)
RBC.: 3.31 MIL/uL — ABNORMAL LOW (ref 4.22–5.81)
Retic Count, Absolute: 526.5 10*3/uL — ABNORMAL HIGH (ref 19.0–186.0)
Retic Ct Pct: 16 % — ABNORMAL HIGH (ref 0.4–3.1)

## 2020-05-26 MED ORDER — OXYCODONE HCL 5 MG PO TABS
15.0000 mg | ORAL_TABLET | Freq: Once | ORAL | Status: AC
  Administered 2020-05-26: 15 mg via ORAL
  Filled 2020-05-26: qty 3

## 2020-05-26 MED ORDER — METOCLOPRAMIDE HCL 5 MG/ML IJ SOLN
10.0000 mg | Freq: Once | INTRAMUSCULAR | Status: AC
  Administered 2020-05-26: 10 mg via INTRAVENOUS
  Filled 2020-05-26: qty 2

## 2020-05-26 MED ORDER — DEXTROSE-NACL 5-0.45 % IV SOLN: INTRAVENOUS | Status: DC

## 2020-05-26 MED ORDER — DIPHENHYDRAMINE HCL 50 MG/ML IJ SOLN
25.0000 mg | Freq: Once | INTRAMUSCULAR | Status: AC
  Administered 2020-05-26: 25 mg via INTRAVENOUS
  Filled 2020-05-26: qty 1

## 2020-05-26 MED ORDER — PROCHLORPERAZINE EDISYLATE 10 MG/2ML IJ SOLN
10.0000 mg | Freq: Once | INTRAMUSCULAR | Status: AC
  Administered 2020-05-26: 10 mg via INTRAVENOUS
  Filled 2020-05-26: qty 2

## 2020-05-26 MED ORDER — HYDROMORPHONE HCL 1 MG/ML IJ SOLN
1.0000 mg | Freq: Once | INTRAMUSCULAR | Status: AC
  Administered 2020-05-26: 1 mg via INTRAVENOUS
  Filled 2020-05-26: qty 1

## 2020-05-26 MED ORDER — HYDROCODONE-ACETAMINOPHEN 5-325 MG PO TABS
1.0000 | ORAL_TABLET | Freq: Four times a day (QID) | ORAL | 0 refills | Status: DC | PRN
Start: 2020-05-26 — End: 2020-09-15

## 2020-05-26 MED ORDER — KETOROLAC TROMETHAMINE 15 MG/ML IJ SOLN
15.0000 mg | INTRAMUSCULAR | Status: AC
  Administered 2020-05-26: 15 mg via INTRAVENOUS
  Filled 2020-05-26: qty 1

## 2020-05-26 MED ORDER — MAGNESIUM SULFATE 2 GM/50ML IV SOLN
2.0000 g | INTRAVENOUS | Status: AC
  Administered 2020-05-26: 2 g via INTRAVENOUS
  Filled 2020-05-26: qty 50

## 2020-05-26 NOTE — Discharge Instructions (Addendum)
You are seen today for sickle cell crisis, we did speak about the option of you being admitted for pain control but you did not want to be at this time, if you do change your mind you can always come back to the emergency department.  Please continue to take your medications as prescribed, stay hydrated and follow-up with your primary care in the next couple of days.  If you have any new or worsening concerning symptoms please come back to the emergency department.

## 2020-05-26 NOTE — ED Triage Notes (Signed)
Pt sts severe headache and body aches all over. Rates pain as 10/10 and diaphoretic in triage.

## 2020-05-26 NOTE — ED Provider Notes (Signed)
[facility]-EMERGENCY DEPT Provider Note   CSN: [id number] Arrival date & time: 05/26/20  0416     History Chief Complaint  Patient presents with  . Sickle Cell Pain Crisis    Matthew Frederick is a 19 y.o. male.  19 year old male with history of sickle cell anemia, followed by [facility], presents to the ED complaining of sickle cell crisis pain.  He describes pain in his bilateral arms and bilateral legs consistent with prior crisis episodes.  This pain is pulsating, constant.  It is associated with a global, pressure and throbbing headache.  Patient reports having headache with prior crisis as well, but also experiences a degree of headache daily.  Is experiencing photophobia and phonophobia with his headache today.  Symptoms have been unrelieved with 5 mg oxycodone x2.  Vomited x 1, but feels this was due to the pain medicine.  No fevers, chest pain, SOB, bowel changes, vision changes or loss, extremity numbness or weakness.  The history is provided by the patient. No language interpreter was used.  Sickle Cell Pain Crisis      Past Medical History:  Diagnosis Date  . Sickle cell anemia [facility])     Patient Active Problem List   Diagnosis Date Noted  . Sickle cell anemia with crisis (HCC) 01/21/2020  . Anemia   . Sickle-cell crisis (HCC) 09/03/2019  . Sickle cell anemia with pain (HCC) 08/05/2019  . Abnormal liver function 04/02/2019  . Thrombocytopenia (HCC) 11/24/2018  . Leukocytosis 11/22/2018  . Chest pain   . Coccyx pain   . Sickle cell pain crisis (HCC)   . Fever   . Sickle cell crisis (HCC) 07/23/2016  . Transition of care performed with sharing of clinical summary 04/28/2016  . Need for immunization against influenza 04/22/2012    History reviewed. No pertinent surgical history.     Family History  Problem Relation Age of Onset  . Sickle cell anemia Father   . Diabetes Maternal Grandmother     Social History   Tobacco Use  .  Smoking status: Never Smoker  . Smokeless tobacco: Never Used  Vaping Use  . Vaping Use: Never used  Substance Use Topics  . Alcohol use: No  . Drug use: No    Home Medications Prior to Admission medications   Medication Sig Start Date End Date Taking? Authorizing Provider  folic acid (FOLVITE) 1 MG tablet Take 1 mg by mouth at bedtime.     [provider]  HYDROcodone-acetaminophen (NORCO/VICODIN) 5-325 MG tablet Take 1 tablet by mouth every 6 (six) hours as needed for moderate pain or severe pain.    [provider]  hydroxyurea (HYDREA) 500 MG capsule Take 1,500 mg by mouth at bedtime.     [provider]  Oxycodone HCl 10 MG TABS Take 1 tablet (10 mg total) by mouth every 6 (six) hours as needed. Patient taking differently: Take 10 mg by mouth every 6 (six) hours as needed (pain).  12/05/19   Massie Maroon, FNP    Allergies    Patient has no known allergies.  Review of Systems   Review of Systems  Ten systems reviewed and are negative for acute change, except as noted in the HPI.    Physical Exam Updated Vital Signs BP 114/66 (BP Location: Right Arm)   Pulse 73   Temp (!) 97.5 F (36.4 C) (Oral)   Resp 18   Ht 5\' 9"  (1.753 m)   Wt 65.8 kg  SpO2 97%   BMI 21.41 kg/m   Physical Exam Vitals and nursing note reviewed.  Constitutional:      General: He is not in acute distress.    Appearance: He is well-developed. He is not diaphoretic.     Comments: Appears uncomfortable, but nontoxic  HENT:     Head: Normocephalic and atraumatic.     Right Ear: External ear normal.     Left Ear: External ear normal.  Eyes:     General: No scleral icterus.    Conjunctiva/sclera: Conjunctivae normal.  Neck:     Comments: No meningismus Cardiovascular:     Rate and Rhythm: Normal rate and regular rhythm.     Pulses: Normal pulses.  Pulmonary:     Effort: Pulmonary effort is normal. No respiratory distress.     Breath sounds: No stridor. No  wheezing, rhonchi or rales.     Comments: Lungs clear bilaterally.  Respirations even and unlabored. Musculoskeletal:        General: Normal range of motion.     Cervical back: Normal range of motion.  Skin:    General: Skin is warm and dry.     Coloration: Skin is not pale.     Findings: No erythema or rash.  Neurological:     Mental Status: He is alert and oriented to person, place, and time.     Coordination: Coordination normal.     Comments: GCS 15. Speech is goal oriented. No cranial nerve deficits appreciated; symmetric eyebrow raise, no facial drooping, tongue midline. Patient has equal grip strength bilaterally with 5/5 strength against resistance in all major muscle groups bilaterally. Sensation to light touch intact. Patient moves extremities without ataxia.   Psychiatric:        Behavior: Behavior normal.     ED Results / Procedures / Treatments   Labs (all labs ordered are listed, but only abnormal results are displayed) Labs Reviewed  COMPREHENSIVE METABOLIC PANEL - Abnormal; Notable for the following components:      Result Value   Glucose, Bld 104 (*)    Total Bilirubin 5.4 (*)    All other components within normal limits  CBC WITH DIFFERENTIAL/PLATELET - Abnormal; Notable for the following components:   WBC 16.5 (*)    RBC 3.39 (*)    Hemoglobin 10.8 (*)    HCT 30.2 (*)    RDW 21.5 (*)    Platelets 552 (*)    nRBC 2.5 (*)    Neutro Abs 10.0 (*)    Monocytes Absolute 2.7 (*)    Basophils Absolute 0.2 (*)    Abs Immature Granulocytes 0.25 (*)    All other components within normal limits  RETICULOCYTES - Abnormal; Notable for the following components:   Retic Ct Pct 16.0 (*)    RBC. 3.31 (*)    Retic Count, Absolute 526.5 (*)    Immature Retic Fract 29.2 (*)    All other components within normal limits    EKG None  Radiology No results found.  Procedures Procedures (including critical care time)  Medications Ordered in ED Medications  dextrose  5 %-0.45 % sodium chloride infusion ( Intravenous New Bag/Given 05/26/20 0510)  magnesium sulfate IVPB 2 g 50 mL (2 g Intravenous New Bag/Given 05/26/20 0649)  ketorolac (TORADOL) 15 MG/ML injection 15 mg (15 mg Intravenous Given 05/26/20 0503)  metoCLOPramide (REGLAN) injection 10 mg (10 mg Intravenous Given 05/26/20 0503)  HYDROmorphone (DILAUDID) injection 1 mg (1 mg Intravenous Given 05/26/20 0501)  HYDROmorphone (DILAUDID) injection 1 mg (1 mg Intravenous Given 05/26/20 0543)  prochlorperazine (COMPAZINE) injection 10 mg (10 mg Intravenous Given 05/26/20 0640)  diphenhydrAMINE (BENADRYL) injection 25 mg (25 mg Intravenous Given 05/26/20 0640)    ED Course  I have reviewed the triage vital signs and the nursing notes.  Pertinent labs & imaging results that were available during my care of the patient were reviewed by me and considered in my medical decision making (see chart for details).  Clinical Course as of May 26 656  Sat May 26, 2020  4098 States that extremity pain has improved down to 4/10.  Headache persists at 10/10.  Will add Compazine, Benadryl, magnesium infusion.  IV Benadryl should also help with complaints of itching at IV site.   [KH]    Clinical Course User Index [KH] Antony Madura, PA-C   MDM Rules/Calculators/A&P                          19 year old male presenting for extremity pain consistent with sickle cell crisis.  Symptoms also associated with a headache with photophobia and phonophobia.  No fevers, nuchal rigidity, meningismus.  Neurologic exam is nonfocal.  His laboratory evaluation is at baseline.  Pending additional medications for symptom control.  Extremity pain has improved down to 4/10 with IV Toradol, Dilaudid.  Care signed out to [place], PA-C at change of shift pending reassessment.   Final Clinical Impression(s) / ED Diagnoses Final diagnoses:  Sickle cell pain crisis [facility])  Bad headache    Rx / DC Orders ED Discharge Orders    None        Antony Madura, PA-C 05/26/20 1191    Geoffery Lyons, MD 05/26/20 [phone]

## 2020-05-26 NOTE — ED Provider Notes (Addendum)
Care of the patient was assumed from K. Humes PA-C at 630; see this provider's note for complete history of present illness, review of systems, and physical exam.  Briefly, the patient is a 19 y.o. male who presented to the ED with sickle cell crisis.  Patient presents emergency department reporting bilateral upper and lower extremity pain and Throbbing headache, all of these are consistent for him for his normal sickle cell crises.  Denies any fevers, chest pain, shortness of breath, URI symptoms, numbness or weakness, vision changes or neck pain.  Patient has received 2 doses of Dilaudid at this time and IV Toradol, patient states that extremity pain is now improved from a 4 out of 10, still complaining of headache.  Previous provider added Compazine, Benadryl and a magnesium infusion.  Blood work appears to be stable according to previous provider.   Plan at time of handoff: Evaluate after pain medication for headache given.    Physical Exam  BP 114/66 (BP Location: Right Arm)   Pulse 73   Temp (!) 97.5 F (36.4 C) (Oral)   Resp 18   Ht 5\' 9"  (1.753 m)   Wt 65.8 kg   SpO2 97%   BMI 21.41 kg/m   Physical Exam Constitutional:      General: He is not in acute distress.    Appearance: Normal appearance. He is not ill-appearing, toxic-appearing or diaphoretic.  HENT:     Mouth/Throat:     Mouth: Mucous membranes are moist.     Pharynx: Oropharynx is clear.  Eyes:     General: No scleral icterus.    Extraocular Movements: Extraocular movements intact.     Pupils: Pupils are equal, round, and reactive to light.  Cardiovascular:     Rate and Rhythm: Normal rate and regular rhythm.     Pulses: Normal pulses.     Heart sounds: Normal heart sounds.  Pulmonary:     Effort: Pulmonary effort is normal. No respiratory distress.     Breath sounds: Normal breath sounds. No stridor. No wheezing, rhonchi or rales.  Chest:     Chest wall: No tenderness.  Abdominal:     General: Abdomen is  flat. There is no distension.     Palpations: Abdomen is soft.     Tenderness: There is no abdominal tenderness. There is no guarding or rebound.  Musculoskeletal:        General: No swelling or tenderness. Normal range of motion.     Cervical back: Normal range of motion and neck supple. No rigidity.     Right lower leg: No edema.     Left lower leg: No edema.  Skin:    General: Skin is warm and dry.     Capillary Refill: Capillary refill takes less than 2 seconds.     Coloration: Skin is not pale.  Neurological:     General: No focal deficit present.     Mental Status: He is alert and oriented to person, place, and time.     Cranial Nerves: No cranial nerve deficit.     Sensory: No sensory deficit.     Motor: No weakness.     Coordination: Coordination normal.  Psychiatric:        Mood and Affect: Mood normal.        Behavior: Behavior normal.     ED Course/Procedures   Clinical Course as of May 26 650  Sat May 26, 2020  0621 States that extremity pain has improved down  to 4/10.  Headache persists at 10/10.  Will add Compazine, Benadryl, magnesium infusion.  IV Benadryl should also help with complaints of itching at IV site.   [KH]    Clinical Course User Index [KH] Antony Madura, PA-C    Procedures  MDM  945 upon reevaluation patient states that  HA has decreased after Compazine, Benadryl and mag infusion given.  States that headache was a 10/10 and has now an 8/10.  Shared decision making about bringing patient in since patient has received 2 rounds of IV pain medication, patient asking for 1 more dose.  Did discuss that we can give him oral treatment at this time, will reassess after that.  Normal neuro exam, no menigsmus.  Labs do appear to be baseline, no concerns of CVA, acute chest.  1127 upon reevaluation patient states that he feels much better than when he came in, headache is now a 6/10, did discuss option of patient coming in to the hospital for better pain  control again, however patient does not want this.  Patient states that he is ready to go home.  Patient to be discharged at this time.  Doubt need for further emergent work up at this time. I explained the diagnosis and have given explicit precautions to return to the ER including for any other new or worsening symptoms. The patient understands and accepts the medical plan as it's been dictated and I have answered their questions. Discharge instructions concerning home care and prescriptions have been given. The patient is STABLE and is discharged to home in good condition.  When patient was being discharged he asked for refill of his narcotics, did discuss that we cannot do this and he can call his PCP on Monday.  Asking for a couple to get through over the weekend, states that he has not had these filled since July, records do show that he has not picked up medication since July.  Will give 3 Norco to get him through the weekend, he states that he only takes these as needed.  States that he wants them just in case. Did discuss this with Dr. Effie Shy who agrees.      Farrel Gordon, PA-C 05/26/20 1204    Mancel Bale, MD 05/27/20 1459

## 2020-05-27 ENCOUNTER — Encounter (HOSPITAL_COMMUNITY): Payer: Self-pay | Admitting: Obstetrics and Gynecology

## 2020-05-27 ENCOUNTER — Other Ambulatory Visit: Payer: Self-pay

## 2020-05-27 ENCOUNTER — Inpatient Hospital Stay (HOSPITAL_COMMUNITY)
Admission: EM | Admit: 2020-05-27 | Discharge: 2020-06-01 | DRG: 812 | Disposition: A | Discharge status: Home / Self Care | Payer: Medicaid Other | Attending: Internal Medicine | Admitting: Internal Medicine

## 2020-05-27 DIAGNOSIS — Z832 Family history of diseases of the blood and blood-forming organs and certain disorders involving the immune mechanism: Secondary | ICD-10-CM | POA: Diagnosis not present

## 2020-05-27 DIAGNOSIS — D5700 Hb-SS disease with crisis, unspecified: Principal | ICD-10-CM | POA: Diagnosis present

## 2020-05-27 DIAGNOSIS — G43909 Migraine, unspecified, not intractable, without status migrainosus: Secondary | ICD-10-CM | POA: Diagnosis not present

## 2020-05-27 DIAGNOSIS — R519 Headache, unspecified: Secondary | ICD-10-CM | POA: Diagnosis present

## 2020-05-27 DIAGNOSIS — G894 Chronic pain syndrome: Secondary | ICD-10-CM | POA: Diagnosis present

## 2020-05-27 DIAGNOSIS — Z20822 Contact with and (suspected) exposure to covid-19: Secondary | ICD-10-CM | POA: Diagnosis present

## 2020-05-27 DIAGNOSIS — Z79899 Other long term (current) drug therapy: Secondary | ICD-10-CM

## 2020-05-27 DIAGNOSIS — D72829 Elevated white blood cell count, unspecified: Secondary | ICD-10-CM | POA: Diagnosis present

## 2020-05-27 DIAGNOSIS — G44201 Tension-type headache, unspecified, intractable: Secondary | ICD-10-CM | POA: Diagnosis not present

## 2020-05-27 LAB — COMPREHENSIVE METABOLIC PANEL
ALT: 14 U/L (ref 0–44)
AST: 27 U/L (ref 15–41)
Albumin: 4.3 g/dL (ref 3.5–5.0)
Alkaline Phosphatase: 103 U/L (ref 38–126)
Anion gap: 9 (ref 5–15)
BUN: 9 mg/dL (ref 6–20)
CO2: 27 mmol/L (ref 22–32)
Calcium: 9.3 mg/dL (ref 8.9–10.3)
Chloride: 100 mmol/L (ref 98–111)
Creatinine, Ser: 0.62 mg/dL (ref 0.61–1.24)
GFR, Estimated: >60 mL/min (ref >60)
Glucose, Bld: 92 mg/dL (ref 70–99)
Potassium: 4.7 mmol/L (ref 3.5–5.1)
Sodium: 136 mmol/L (ref 135–145)
Total Bilirubin: 8.3 mg/dL — ABNORMAL HIGH (ref 0.3–1.2)
Total Protein: 8 g/dL (ref 6.5–8.1)

## 2020-05-27 LAB — CBC WITH DIFFERENTIAL/PLATELET
Abs Immature Granulocytes: 0.12 10*3/uL — ABNORMAL HIGH (ref 0.00–0.07)
Basophils Absolute: 0.1 10*3/uL (ref 0.0–0.1)
Basophils Relative: 1 %
Eosinophils Absolute: 0 10*3/uL (ref 0.0–0.5)
Eosinophils Relative: 0 %
HCT: 29 % — ABNORMAL LOW (ref 39.0–52.0)
Hemoglobin: 10.6 g/dL — ABNORMAL LOW (ref 13.0–17.0)
Immature Granulocytes: 1 %
Lymphocytes Relative: 15 %
Lymphs Abs: 2.6 10*3/uL (ref 0.7–4.0)
MCH: 32.2 pg (ref 26.0–34.0)
MCHC: 36.6 g/dL — ABNORMAL HIGH (ref 30.0–36.0)
MCV: 88.1 fL (ref 80.0–100.0)
Monocytes Absolute: 2.6 10*3/uL — ABNORMAL HIGH (ref 0.1–1.0)
Monocytes Relative: 15 %
Neutro Abs: 11.9 10*3/uL — ABNORMAL HIGH (ref 1.7–7.7)
Neutrophils Relative %: 68 %
Platelets: 503 10*3/uL — ABNORMAL HIGH (ref 150–400)
RBC: 3.29 MIL/uL — ABNORMAL LOW (ref 4.22–5.81)
RDW: 20.9 % — ABNORMAL HIGH (ref 11.5–15.5)
WBC: 17.4 10*3/uL — ABNORMAL HIGH (ref 4.0–10.5)
nRBC: 3.5 % — ABNORMAL HIGH (ref 0.0–0.2)

## 2020-05-27 LAB — RETICULOCYTES
Immature Retic Fract: 23.8 % — ABNORMAL HIGH (ref 2.3–15.9)
RBC.: 3.28 MIL/uL — ABNORMAL LOW (ref 4.22–5.81)
Retic Count, Absolute: 583.5 10*3/uL — ABNORMAL HIGH (ref 19.0–186.0)
Retic Ct Pct: 17.2 % — ABNORMAL HIGH (ref 0.4–3.1)

## 2020-05-27 LAB — RESPIRATORY PANEL BY RT PCR (FLU A&B, COVID)
Influenza A by PCR: NEGATIVE
Influenza B by PCR: NEGATIVE
SARS Coronavirus 2 by RT PCR: NEGATIVE

## 2020-05-27 MED ORDER — HYDROXYUREA 500 MG PO CAPS
1500.0000 mg | ORAL_CAPSULE | Freq: Every day | ORAL | Status: DC
  Administered 2020-05-27 – 2020-05-31 (×5): 1500 mg via ORAL
  Filled 2020-05-27 (×6): qty 3

## 2020-05-27 MED ORDER — KETOROLAC TROMETHAMINE 30 MG/ML IJ SOLN
30.0000 mg | Freq: Four times a day (QID) | INTRAMUSCULAR | Status: DC | PRN
  Administered 2020-05-27: 30 mg via INTRAVENOUS
  Filled 2020-05-27: qty 1

## 2020-05-27 MED ORDER — FOLIC ACID 1 MG PO TABS
1.0000 mg | ORAL_TABLET | Freq: Every day | ORAL | Status: DC
  Administered 2020-05-27 – 2020-05-31 (×5): 1 mg via ORAL
  Filled 2020-05-27 (×5): qty 1

## 2020-05-27 MED ORDER — ONDANSETRON HCL 4 MG PO TABS: 4.0000 mg | ORAL_TABLET | Freq: Four times a day (QID) | ORAL | Status: DC | PRN

## 2020-05-27 MED ORDER — HYDROMORPHONE HCL 1 MG/ML IJ SOLN
1.0000 mg | INTRAMUSCULAR | Status: DC | PRN
  Administered 2020-05-28: 1 mg via INTRAVENOUS
  Filled 2020-05-27: qty 1

## 2020-05-27 MED ORDER — SODIUM CHLORIDE 0.9 % IV SOLN: INTRAVENOUS | Status: DC

## 2020-05-27 MED ORDER — ONDANSETRON HCL 4 MG/2ML IJ SOLN: 4.0000 mg | Freq: Four times a day (QID) | INTRAMUSCULAR | Status: DC | PRN

## 2020-05-27 MED ORDER — HYDROMORPHONE HCL 1 MG/ML IJ SOLN
1.0000 mg | INTRAMUSCULAR | Status: AC
  Administered 2020-05-27: 1 mg via INTRAVENOUS
  Filled 2020-05-27: qty 1

## 2020-05-27 MED ORDER — PANTOPRAZOLE SODIUM 40 MG PO TBEC
40.0000 mg | DELAYED_RELEASE_TABLET | Freq: Every day | ORAL | Status: DC
  Administered 2020-05-27 – 2020-06-01 (×6): 40 mg via ORAL
  Filled 2020-05-27 (×6): qty 1

## 2020-05-27 MED ORDER — DOCUSATE SODIUM 100 MG PO CAPS
100.0000 mg | ORAL_CAPSULE | Freq: Two times a day (BID) | ORAL | Status: DC
  Administered 2020-05-27 – 2020-06-01 (×10): 100 mg via ORAL
  Filled 2020-05-27 (×10): qty 1

## 2020-05-27 MED ORDER — ENOXAPARIN SODIUM 40 MG/0.4ML ~~LOC~~ SOLN
40.0000 mg | SUBCUTANEOUS | Status: DC
  Administered 2020-05-28 – 2020-05-31 (×4): 40 mg via SUBCUTANEOUS
  Filled 2020-05-27 (×5): qty 0.4

## 2020-05-27 MED ORDER — DIPHENHYDRAMINE HCL 25 MG PO CAPS: 25.0000 mg | ORAL_CAPSULE | ORAL | Status: DC | PRN

## 2020-05-27 MED ORDER — METOCLOPRAMIDE HCL 5 MG/ML IJ SOLN
10.0000 mg | Freq: Once | INTRAMUSCULAR | Status: AC
  Administered 2020-05-27: 10 mg via INTRAVENOUS
  Filled 2020-05-27: qty 2

## 2020-05-27 MED ORDER — KETOROLAC TROMETHAMINE 15 MG/ML IJ SOLN
15.0000 mg | INTRAMUSCULAR | Status: AC
  Administered 2020-05-27: 15 mg via INTRAVENOUS
  Filled 2020-05-27: qty 1

## 2020-05-27 NOTE — H&P (Addendum)
[facility] History and Physical  Matthew Frederick XLK:[id number] DOB: [birth year] DOA: 05/27/2020  Referring physician: ED  PCP: [name], MD   Patient is coming from: home   Chief Complaint: Painful crisis  HPI: Matthew Frederick is a 19 y.o. male with past medical history of sickle cell disease presented to hospital with complaints of the right upper extremity pain especially over the wrist and arm moderate intensity sharp in nature with some tingling sensation.  He also complains of right lower extremity pain especially over the kidney area similar to his painful crisis.  Patient states that he gets crisis approximately once a month or so.  He follows up with [facility] and takes Hydrea, hydrocodone/oxycodone as outpatient.  Patient stated that he was helping his friend's wedding and had to be out shopping and was exposed to cold which could have contributed to his painful crisis at this time.  He denies fever, chills, denies any nausea, vomiting or abdominal pain.  Denies any urinary urgency, frequency or dysuria.  Denies any dizziness, lightheadedness, shortness of breath, chest pain or palpitation.  Denies syncope or lightheadedness.  Denies sick contacts.  Patient has had vaccinations in the past.  Patient complains of a migrainous headache associated with the pain which is typical for him.  ED Course: In the ED, patient was in painful crisis. Patient received Dilaudid 1 mg x 2, Toradol 30 mg x 1, Reglan and Benadryl.  Patient still persisted to have pain so he was considered for admission to the hospital for painful crisis.  Review of Systems:  All systems were reviewed and were negative unless otherwise mentioned in the HPI  Past Medical History:  Diagnosis Date  . Sickle cell anemia (HCC)    History reviewed. No pertinent surgical history.  Social History:  reports that he has never smoked. He has never used smokeless tobacco. He reports that he does not drink  alcohol and does not use drugs.  No Known Allergies  Family History  Problem Relation Age of Onset  . Sickle cell anemia Father   . Diabetes Maternal Grandmother      Prior to Admission medications   Medication Sig Start Date End Date Taking? Authorizing Provider  folic acid (FOLVITE) 1 MG tablet Take 1 mg by mouth at bedtime.    Yes [provider]  HYDROcodone-acetaminophen (NORCO/VICODIN) 5-325 MG tablet Take 1 tablet by mouth every 6 (six) hours as needed for severe pain. 05/26/20  Yes Patel, Shalyn, PA-C  hydroxyurea (HYDREA) 500 MG capsule Take 1,500 mg by mouth at bedtime.    Yes [provider]  oxyCODONE (ROXICODONE) 5 MG immediate release tablet Take 5 mg by mouth every 4 (four) hours as needed for severe pain.   Yes [provider]  Oxycodone HCl 10 MG TABS Take 1 tablet (10 mg total) by mouth every 6 (six) hours as needed. Patient taking differently: Take 10 mg by mouth every 6 (six) hours as needed (pain).  12/05/19  Yes Massie Maroon, FNP    Physical Exam: Vitals:   05/27/20 1934 05/27/20 1945 05/27/20 2000 05/27/20 2015  BP: 123/73 117/64 120/60 109/60  Pulse: 95 98 97 93  Resp: 18 13    Temp:      TempSrc:      SpO2: 97% 90% 90% (!) 89%  Weight:      Height:       Wt Readings from Last 3 Encounters:  05/27/20 66 kg (35 %, Z= -0.39)*  05/26/20 65.8 kg (34 %, Z= -0.42)*  02/21/20 59.9 kg (15 %, Z= -1.03)*   * Growth percentiles are based on CDC (Boys, 2-20 Years) data.   Body mass index is 21.49 kg/m.  General:  Average built, in mild distress due to pain HENT: Normocephalic, pupils equally reacting to light and accommodation.  Mild pallor noted.  Oral mucosa is moist.  Chest:  Clear breath sounds.  Diminished breath sounds bilaterally. No crackles or wheezes.  CVS: S1 &S2 heard. No murmur.  Regular rate and rhythm. Abdomen: Soft, nontender, nondistended.  Bowel sounds are heard.  Liver is not palpable, no abdominal mass  palpated Extremities: No cyanosis, clubbing or edema.  Peripheral pulses are palpable.  Tenderness on palpation of the right lower extremity and left wrist left upper arm. Psych: Alert, awake and oriented, normal mood CNS:  No cranial nerve deficits.  Power equal in all extremities.   Skin: Warm and dry.  No rashes noted.  Labs on Admission:   CBC: Recent Labs  Lab 05/26/20 0438 05/27/20 1812  WBC 16.5* 17.4*  NEUTROABS 10.0* 11.9*  HGB 10.8* 10.6*  HCT 30.2* 29.0*  MCV 89.1 88.1  PLT 552* 503*    Basic Metabolic Panel: Recent Labs  Lab 05/26/20 0438 05/27/20 1812  NA 138 136  K 3.7 4.7  CL 102 100  CO2 24 27  GLUCOSE 104* 92  BUN 10 9  CREATININE 0.67 0.62  CALCIUM 9.2 9.3    Liver Function Tests: Recent Labs  Lab 05/26/20 0438 05/27/20 1812  AST 30 27  ALT 17 14  ALKPHOS 105 103  BILITOT 5.4* 8.3*  PROT 8.0 8.0  ALBUMIN 4.3 4.3   No results for input(s): LIPASE, AMYLASE in the last 168 hours. No results for input(s): AMMONIA in the last 168 hours.  Cardiac Enzymes: No results for input(s): CKTOTAL, CKMB, CKMBINDEX, TROPONINI in the last 168 hours.  BNP (last 3 results) No results for input(s): BNP in the last 8760 hours.  ProBNP (last 3 results) No results for input(s): PROBNP in the last 8760 hours.  CBG: No results for input(s): GLUCAP in the last 168 hours.  Lipase     Component Value Date/Time   LIPASE 26 01/21/2020 1011     Urinalysis    Component Value Date/Time   COLORURINE YELLOW 09/08/2019 2226   APPEARANCEUR CLEAR 09/08/2019 2226   LABSPEC 1.014 09/08/2019 2226   PHURINE 7.0 09/08/2019 2226   GLUCOSEU NEGATIVE 09/08/2019 2226   HGBUR NEGATIVE 09/08/2019 2226   BILIRUBINUR NEGATIVE 09/08/2019 2226   KETONESUR NEGATIVE 09/08/2019 2226   PROTEINUR NEGATIVE 09/08/2019 2226   NITRITE NEGATIVE 09/08/2019 2226   LEUKOCYTESUR NEGATIVE 09/08/2019 2226     Drugs of Abuse  No results found for: LABOPIA, COCAINSCRNUR, LABBENZ,  AMPHETMU, THCU, LABBARB    Radiological Exams on Admission: No results found.  EKG: Not available for review  Assessment/Plan Active Problems:   Sickle cell pain crisis (HCC)  Sickle cell painful crisis.  History of painful crises in the past.  Will admit the patient with IV fluids, IV narcotics, IV Toradol, antiemetics.  Reticulocyte count appropriately elevated.  Mild anemia noted likely at his baseline.  No obvious infection as per history precipitating this.  Continue hydroxyurea folic acid.  Migraine headache.  Usually associated with his crisis.  Continue supportive care.  DVT Prophylaxis: Lovenox subcu  Consultant: None  Code Status: Full code  Microbiology none  Antibiotics: None  Family Communication:  Patients' condition and  plan of care including tests being ordered have been discussed with the patient  who indicate understanding and agree with the plan.   Status is: Inpatient  Remains inpatient appropriate because:IV treatments appropriate due to intensity of illness or inability to take PO and Inpatient level of care appropriate due to severity of illness   Dispo: The patient is from: Home              Anticipated d/c is to: Home              Anticipated d/c date is: 2 days         Severity of Illness: The appropriate patient status for this patient is INPATIENT. Inpatient status is judged to be reasonable and necessary in order to provide the required intensity of service to ensure the patient's safety. The patient's presenting symptoms, physical exam findings, and initial radiographic and laboratory data in the context of their chronic comorbidities is felt to place them at high risk for further clinical deterioration. Furthermore, it is not anticipated that the patient will be medically stable for discharge from the hospital within 2 midnights of admission.  I certify that at the point of admission it is my clinical judgment that the patient will require  inpatient hospital care spanning beyond 2 midnights from the point of admission due to high intensity of service, high risk for further deterioration and high frequency of surveillance required.   Signed, Joycelyn Das, MD [facility] 05/27/2020

## 2020-05-27 NOTE — ED Triage Notes (Signed)
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 Patient reports left leg pain, right arm pain and a migraine. Patient reports he has not taken any pain medication today

## 2020-05-27 NOTE — ED Provider Notes (Signed)
[facility]-EMERGENCY DEPT Provider Note   CSN: [id number] Arrival date & time: 05/27/20  1749     History Chief Complaint  Patient presents with  . Sickle Cell Pain Crisis    Matthew Frederick is a 19 y.o. male.  Patient with history of sickle cell anemia presents the emergency department for uncontrolled vaso-occlusive pain.  Patient reports migraine headache as well as pain in his right arm and left leg.  Patient was seen in the emergency department yesterday for the same symptoms.  He was treated, improved and released home.  Patient has been taking home oxycodone however it has been insufficient to control his pain.  He takes pain medication on an as-needed basis.  He denies shortness of breath, fevers, cough or trouble breathing.  He feels that his pain is severe enough to warrant admission to the hospital at this point.  Pain is typical for his vaso-occlusive crises.  Headache is his typical headache symptoms.  Patient denies signs of stroke including: facial droop, slurred speech, aphasia, weakness/numbness in extremities, imbalance/trouble walking. He denies joint swelling, redness or warmth of the skin.  He thinks that the recent cold weather may be triggering his current symptoms.  He is followed at [facility].         Past Medical History:  Diagnosis Date  . Sickle cell anemia [facility])     Patient Active Problem List   Diagnosis Date Noted  . Sickle cell anemia with crisis (HCC) 01/21/2020  . Anemia   . Sickle-cell crisis (HCC) 09/03/2019  . Sickle cell anemia with pain (HCC) 08/05/2019  . Abnormal liver function 04/02/2019  . Thrombocytopenia (HCC) 11/24/2018  . Leukocytosis 11/22/2018  . Chest pain   . Coccyx pain   . Sickle cell pain crisis (HCC)   . Fever   . Sickle cell crisis (HCC) 07/23/2016  . Transition of care performed with sharing of clinical summary 04/28/2016  . Need for immunization against influenza 04/22/2012    History  reviewed. No pertinent surgical history.     Family History  Problem Relation Age of Onset  . Sickle cell anemia Father   . Diabetes Maternal Grandmother     Social History   Tobacco Use  . Smoking status: Never Smoker  . Smokeless tobacco: Never Used  Vaping Use  . Vaping Use: Never used  Substance Use Topics  . Alcohol use: No  . Drug use: No    Home Medications Prior to Admission medications   Medication Sig Start Date End Date Taking? Authorizing Provider  folic acid (FOLVITE) 1 MG tablet Take 1 mg by mouth at bedtime.     [provider]  HYDROcodone-acetaminophen (NORCO/VICODIN) 5-325 MG tablet Take 1 tablet by mouth every 6 (six) hours as needed for severe pain. 05/26/20   Farrel Gordon, PA-C  hydroxyurea (HYDREA) 500 MG capsule Take 1,500 mg by mouth at bedtime.     [provider]  Oxycodone HCl 10 MG TABS Take 1 tablet (10 mg total) by mouth every 6 (six) hours as needed. Patient taking differently: Take 10 mg by mouth every 6 (six) hours as needed (pain).  12/05/19   Massie Maroon, FNP    Allergies    Patient has no known allergies.  Review of Systems   Review of Systems  Constitutional: Negative for fever.  HENT: Negative for rhinorrhea and sore throat.   Eyes: Negative for redness and visual disturbance.  Respiratory: Negative for cough.   Cardiovascular:  Negative for chest pain.  Gastrointestinal: Negative for abdominal pain, diarrhea, nausea and vomiting.  Genitourinary: Negative for dysuria and hematuria.  Musculoskeletal: Positive for arthralgias and myalgias. Negative for joint swelling.  Skin: Negative for rash.  Neurological: Positive for headaches. Negative for facial asymmetry, speech difficulty and weakness.    Physical Exam Updated Vital Signs BP 108/79   Pulse (!) 106   Temp 98.2 F (36.8 C) (Oral)   Resp 20   Ht 5\' 9"  (1.753 m)   Wt 66 kg   SpO2 95%   BMI 21.49 kg/m   Physical Exam Vitals and nursing note  reviewed.  Constitutional:      Appearance: He is well-developed.  HENT:     Head: Normocephalic and atraumatic.  Eyes:     General:        Right eye: No discharge.        Left eye: No discharge.     Conjunctiva/sclera: Conjunctivae normal.  Cardiovascular:     Rate and Rhythm: Normal rate and regular rhythm.     Heart sounds: Normal heart sounds.  Pulmonary:     Effort: Pulmonary effort is normal.     Breath sounds: Normal breath sounds.  Abdominal:     Palpations: Abdomen is soft.     Tenderness: There is no abdominal tenderness.  Musculoskeletal:     Cervical back: Normal range of motion and neck supple.     Comments: Patient reports right forearm and right upper arm as well as the left leg between the mid calf and thigh.  No skin findings to suggest cellulitis.  No swollen joints.  Skin:    General: Skin is warm and dry.  Neurological:     Mental Status: He is alert.     ED Results / Procedures / Treatments   Labs (all labs ordered are listed, but only abnormal results are displayed) Labs Reviewed  COMPREHENSIVE METABOLIC PANEL - Abnormal; Notable for the following components:      Result Value   Total Bilirubin 8.3 (*)    All other components within normal limits  CBC WITH DIFFERENTIAL/PLATELET - Abnormal; Notable for the following components:   WBC 17.4 (*)    RBC 3.29 (*)    Hemoglobin 10.6 (*)    HCT 29.0 (*)    MCHC 36.6 (*)    RDW 20.9 (*)    Platelets 503 (*)    nRBC 3.5 (*)    Neutro Abs 11.9 (*)    Monocytes Absolute 2.6 (*)    Abs Immature Granulocytes 0.12 (*)    All other components within normal limits  RETICULOCYTES - Abnormal; Notable for the following components:   Retic Ct Pct 17.2 (*)    RBC. 3.28 (*)    Retic Count, Absolute 583.5 (*)    Immature Retic Fract 23.8 (*)    All other components within normal limits  RESPIRATORY PANEL BY RT PCR (FLU A&B, COVID)    EKG None  Radiology No results found.  Procedures Procedures  (including critical care time)  Medications Ordered in ED Medications  HYDROmorphone (DILAUDID) injection 1 mg (has no administration in time range)  diphenhydrAMINE (BENADRYL) capsule 25-50 mg (has no administration in time range)  ketorolac (TORADOL) 15 MG/ML injection 15 mg (15 mg Intravenous Given 05/27/20 1925)  HYDROmorphone (DILAUDID) injection 1 mg (1 mg Intravenous Given 05/27/20 1926)  metoCLOPramide (REGLAN) injection 10 mg (10 mg Intravenous Given 05/27/20 1925)    ED Course  I have reviewed  the triage vital signs and the nursing notes.  Pertinent labs & imaging results that were available during my care of the patient were reviewed by me and considered in my medical decision making (see chart for details).  Patient seen and examined. Work-up initiated. Medications ordered.   Vital signs reviewed and are as follows: BP 108/79   Pulse (!) 106   Temp 98.2 F (36.8 C) (Oral)   Resp 20   Ht 5\' 9"  (1.753 m)   Wt 66 kg   SpO2 95%   BMI 21.49 kg/m   Labs reviewed.  Hemoglobin is stable.  Bilirubin is elevated more so than yesterday morning.  Labs consistent with vaso-occlusive crisis, appropriate reticulocyte elevation. Will reassess after 2nd dose of medication.   8:48 PM Pt continues to have significant pain after 2 doses of pain medication.  He agrees to admission for pain control.  I spoke with Dr. in regards to admission.  He will see patient.    MDM Rules/Calculators/A&P                          Admit.   Final Clinical Impression(s) / ED Diagnoses Final diagnoses:  Vasoocclusive sickle cell crisis [facility])    Rx / DC Orders ED Discharge Orders    None       [facility], PA-C 05/27/20 2049    2050, MD 05/27/20 2226

## 2020-05-27 NOTE — ED Notes (Signed)
Report called to [facility] on 6E

## 2020-05-28 ENCOUNTER — Inpatient Hospital Stay (HOSPITAL_COMMUNITY): Payer: Medicaid Other

## 2020-05-28 ENCOUNTER — Encounter (HOSPITAL_COMMUNITY): Payer: Self-pay | Admitting: Internal Medicine

## 2020-05-28 DIAGNOSIS — D5700 Hb-SS disease with crisis, unspecified: Secondary | ICD-10-CM | POA: Diagnosis not present

## 2020-05-28 DIAGNOSIS — R519 Headache, unspecified: Secondary | ICD-10-CM | POA: Diagnosis present

## 2020-05-28 DIAGNOSIS — G894 Chronic pain syndrome: Secondary | ICD-10-CM | POA: Diagnosis not present

## 2020-05-28 DIAGNOSIS — G44201 Tension-type headache, unspecified, intractable: Secondary | ICD-10-CM | POA: Diagnosis not present

## 2020-05-28 LAB — COMPREHENSIVE METABOLIC PANEL
ALT: 14 U/L (ref 0–44)
AST: 27 U/L (ref 15–41)
Albumin: 3.6 g/dL (ref 3.5–5.0)
Alkaline Phosphatase: 84 U/L (ref 38–126)
Anion gap: 7 (ref 5–15)
BUN: 10 mg/dL (ref 6–20)
CO2: 26 mmol/L (ref 22–32)
Calcium: 8.6 mg/dL — ABNORMAL LOW (ref 8.9–10.3)
Chloride: 104 mmol/L (ref 98–111)
Creatinine, Ser: 0.62 mg/dL (ref 0.61–1.24)
GFR, Estimated: >60 mL/min (ref >60)
Glucose, Bld: 95 mg/dL (ref 70–99)
Potassium: 4.1 mmol/L (ref 3.5–5.1)
Sodium: 137 mmol/L (ref 135–145)
Total Bilirubin: 5.9 mg/dL — ABNORMAL HIGH (ref 0.3–1.2)
Total Protein: 7.1 g/dL (ref 6.5–8.1)

## 2020-05-28 LAB — CBC
HCT: 26.8 % — ABNORMAL LOW (ref 39.0–52.0)
Hemoglobin: 9.7 g/dL — ABNORMAL LOW (ref 13.0–17.0)
MCH: 32.3 pg (ref 26.0–34.0)
MCHC: 36.2 g/dL — ABNORMAL HIGH (ref 30.0–36.0)
MCV: 89.3 fL (ref 80.0–100.0)
Platelets: 466 10*3/uL — ABNORMAL HIGH (ref 150–400)
RBC: 3 MIL/uL — ABNORMAL LOW (ref 4.22–5.81)
RDW: 20.4 % — ABNORMAL HIGH (ref 11.5–15.5)
WBC: 15.8 10*3/uL — ABNORMAL HIGH (ref 4.0–10.5)
nRBC: 2.5 % — ABNORMAL HIGH (ref 0.0–0.2)

## 2020-05-28 MED ORDER — ACETAMINOPHEN 500 MG PO TABS
1000.0000 mg | ORAL_TABLET | Freq: Once | ORAL | Status: AC
  Administered 2020-05-28: 1000 mg via ORAL
  Filled 2020-05-28: qty 2

## 2020-05-28 MED ORDER — SODIUM CHLORIDE 0.9% FLUSH: 9.0000 mL | INTRAVENOUS | Status: DC | PRN

## 2020-05-28 MED ORDER — KETOROLAC TROMETHAMINE 30 MG/ML IJ SOLN
15.0000 mg | Freq: Four times a day (QID) | INTRAMUSCULAR | Status: AC
  Administered 2020-05-28 – 2020-05-29 (×5): 15 mg via INTRAVENOUS
  Filled 2020-05-28 (×5): qty 1

## 2020-05-28 MED ORDER — HYDROMORPHONE 1 MG/ML IV SOLN
INTRAVENOUS | Status: DC
  Administered 2020-05-28: 3.9 mg via INTRAVENOUS
  Administered 2020-05-28: 4.2 mg via INTRAVENOUS
  Administered 2020-05-28: 0.8 mg via INTRAVENOUS
  Administered 2020-05-28: 3.6 mg via INTRAVENOUS
  Administered 2020-05-28: 2.7 mg via INTRAVENOUS
  Administered 2020-05-29: 0.9 mg via INTRAVENOUS
  Administered 2020-05-29: 2 mg via INTRAVENOUS
  Administered 2020-05-29: 3.3 mg via INTRAVENOUS
  Filled 2020-05-28: qty 30

## 2020-05-28 MED ORDER — ONDANSETRON HCL 4 MG/2ML IJ SOLN: 4.0000 mg | Freq: Four times a day (QID) | INTRAMUSCULAR | Status: DC | PRN

## 2020-05-28 MED ORDER — SODIUM CHLORIDE 0.45 % IV SOLN: INTRAVENOUS | Status: DC

## 2020-05-28 MED ORDER — OXYCODONE HCL 5 MG PO TABS
10.0000 mg | ORAL_TABLET | ORAL | Status: DC | PRN
  Administered 2020-05-28 – 2020-05-31 (×9): 10 mg via ORAL
  Filled 2020-05-28 (×10): qty 2

## 2020-05-28 MED ORDER — NALOXONE HCL 0.4 MG/ML IJ SOLN: 0.4000 mg | INTRAMUSCULAR | Status: DC | PRN

## 2020-05-28 MED ORDER — KETOROLAC TROMETHAMINE 30 MG/ML IJ SOLN
15.0000 mg | Freq: Four times a day (QID) | INTRAMUSCULAR | Status: DC | PRN
  Administered 2020-05-28: 15 mg via INTRAVENOUS
  Filled 2020-05-28: qty 1

## 2020-05-28 MED ORDER — DIPHENHYDRAMINE HCL 12.5 MG/5ML PO ELIX: 12.5000 mg | ORAL_SOLUTION | Freq: Four times a day (QID) | ORAL | Status: DC | PRN

## 2020-05-28 NOTE — Progress Notes (Signed)
Subjective: Matthew Frederick is a 19 year old male with a medical history significant for sickle cell disease was admitted for sickle cell pain crisis.  Today, patient complains of a frontal headache for 2 days that has been unrelieved by medications.  He also reports pain to upper and lower extremities that is consistent with his previous sickle cell pain crisis. Pain intensity is 9/10. Patient denies blurred vision, dizziness, paresthesias.  No urinary symptoms, nausea, vomiting, or diarrhea.  Objective:  Vital signs in last 24 hours:  Vitals:   05/27/20 2215 05/27/20 2307 05/28/20 0303 05/28/20 0609  BP: 113/62 115/75 113/78 125/88  Pulse: 91 79 75 89  Resp: 16 18 16 14   Temp:  98.8 F (37.1 C) 98.1 F (36.7 C) 99.8 F (37.7 C)  TempSrc:  Oral Oral Oral  SpO2: 92% 93% 98% 95%  Weight:  60.2 kg    Height:        Intake/Output from previous day:  No intake or output data in the 24 hours ending 05/28/20 0716  Physical Exam: General: Alert, awake, oriented x3, in no acute distress.  HEENT: [place]/AT PEERL, EOMI.  Scleral icterus Neck: Trachea midline,  no masses, no thyromegal,y no JVD, no carotid bruit OROPHARYNX:  Moist, No exudate/ erythema/lesions.  Heart: Regular rate and rhythm, without murmurs, rubs, gallops, PMI non-displaced, no heaves or thrills on palpation.  Lungs: Clear to auscultation, no wheezing or rhonchi noted. No increased vocal fremitus resonant to percussion  Abdomen: Soft, nontender, nondistended, positive bowel sounds, no masses no hepatosplenomegaly noted..  Neuro: No focal neurological deficits noted cranial nerves II through XII grossly intact. DTRs 2+ bilaterally upper and lower extremities. Strength 5 out of 5 in bilateral upper and lower extremities. Musculoskeletal: No warm swelling or erythema around joints, no spinal tenderness noted. Psychiatric: Patient alert and oriented x3, good insight and cognition, good recent to remote recall. Lymph node  survey: No cervical axillary or inguinal lymphadenopathy noted.  Lab Results:  Basic Metabolic Panel:    Component Value Date/Time   NA 137 05/28/2020 0533   K 4.1 05/28/2020 0533   CL 104 05/28/2020 0533   CO2 26 05/28/2020 0533   BUN 10 05/28/2020 0533   CREATININE 0.62 05/28/2020 0533   GLUCOSE 95 05/28/2020 0533   CALCIUM 8.6 (L) 05/28/2020 0533   CBC:    Component Value Date/Time   WBC 15.8 (H) 05/28/2020 0533   HGB 9.7 (L) 05/28/2020 0533   HCT 26.8 (L) 05/28/2020 0533   PLT 466 (H) 05/28/2020 0533   MCV 89.3 05/28/2020 0533   NEUTROABS 11.9 (H) 05/27/2020 1812   LYMPHSABS 2.6 05/27/2020 1812   MONOABS 2.6 (H) 05/27/2020 1812   EOSABS 0.0 05/27/2020 1812   BASOSABS 0.1 05/27/2020 1812    Recent Results (from the past 240 hour(s))  Respiratory Panel by RT PCR (Flu A&B, Covid) - Nasopharyngeal Swab     Status: None   Collection Time: 05/27/20  7:35 PM   Specimen: Nasopharyngeal Swab  Result Value Ref Range Status   SARS Coronavirus 2 by RT PCR NEGATIVE NEGATIVE Final    Comment: (NOTE) SARS-CoV-2 target nucleic acids are NOT DETECTED.  The SARS-CoV-2 RNA is generally detectable in upper respiratoy specimens during the acute phase of infection. The lowest concentration of SARS-CoV-2 viral copies this assay can detect is 131 copies/mL. A negative result does not preclude SARS-Cov-2 infection and should not be used as the sole basis for treatment or other patient management decisions. A negative result may  occur with  improper specimen collection/handling, submission of specimen other than nasopharyngeal swab, presence of viral mutation(s) within the areas targeted by this assay, and inadequate number of viral copies (<131 copies/mL). A negative result must be combined with clinical observations, patient history, and epidemiological information. The expected result is Negative.  Fact Sheet for Patients:  https://www.moore.com/  Fact Sheet  for Healthcare Providers:  https://www.young.biz/  This test is no t yet approved or cleared by the [place] FDA and  has been authorized for detection and/or diagnosis of SARS-CoV-2 by FDA under an Emergency Use Authorization (EUA). This EUA will remain  in effect (meaning this test can be used) for the duration of the COVID-19 declaration under Section 564(b)(1) of the Act, 21 U.S.C. section 360bbb-3(b)(1), unless the authorization is terminated or revoked sooner.     Influenza A by PCR NEGATIVE NEGATIVE Final   Influenza B by PCR NEGATIVE NEGATIVE Final    Comment: (NOTE) The Xpert Xpress SARS-CoV-2/FLU/RSV assay is intended as an aid in  the diagnosis of influenza from Nasopharyngeal swab specimens and  should not be used as a sole basis for treatment. Nasal washings and  aspirates are unacceptable for Xpert Xpress SARS-CoV-2/FLU/RSV  testing.  Fact Sheet for Patients: https://www.moore.com/  Fact Sheet for Healthcare Providers: https://www.young.biz/  This test is not yet approved or cleared by the [place] FDA and  has been authorized for detection and/or diagnosis of SARS-CoV-2 by  FDA under an Emergency Use Authorization (EUA). This EUA will remain  in effect (meaning this test can be used) for the duration of the  Covid-19 declaration under Section 564(b)(1) of the Act, 21  U.S.C. section 360bbb-3(b)(1), unless the authorization is  terminated or revoked. Performed at [facility], [address] [place], [place] [address]     Studies/Results: No results found.  Medications: Scheduled Meds: . docusate sodium  100 mg Oral BID  . enoxaparin (LOVENOX) injection  40 mg Subcutaneous Q24H  . folic acid  1 mg Oral QHS  . HYDROmorphone   Intravenous Q4H  . hydroxyurea  1,500 mg Oral QHS  . pantoprazole  40 mg Oral Daily   Continuous Infusions: . sodium chloride     PRN  Meds:.diphenhydrAMINE, ketorolac, naloxone **AND** sodium chloride flush, oxyCODONE  Consultants:  None  Procedures:  None  Antibiotics:  None  Assessment/Plan: Active Problems:   Sickle cell pain crisis (HCC)  Intractable headache: Patient complaining of a 2-day history of headache that has been unrelieved by analgesics.  He characterizes headache as constant and throbbing.  He has been unable to sleep.  Neuro exam is unremarkable.  Reviewed CT of head, no acute abnormalities.  Tylenol 650 mg every 6 hours as needed for headache.  Sickle cell disease with pain crisis:  Discontinue clinician assisted Dilaudid boluses and 0.9% saline.  Initiate IV Dilaudid PCA with settings of 0.5 mg, 10-minute lockout, and 3 mg/h. IV fluids, 0.45% saline at 100 mL/h Toradol 15 mg IV every 6 hours for total of 5 days Monitor vital signs closely, reevaluate pain scale regularly, and supplemental oxygen as needed.  Leukocytosis: WBCs 15.8, improved from previous.  Patient is afebrile without any signs of infection or inflammation.  Continue to follow closely without antibiotics.  CBC in a.m.  Chronic pain syndrome: Continue home medications   Code Status: Full Code Family Communication: N/A Disposition Plan: Not yet ready for discharge  Ledford Goodson Rennis Petty  APRN, MSN, FNP-C [facility] [address] [place], [place] [address] [phone]  If 5PM-7AM, please contact night-coverage.  05/28/2020, 7:16 AM  LOS: 1 day

## 2020-05-29 DIAGNOSIS — D5700 Hb-SS disease with crisis, unspecified: Secondary | ICD-10-CM | POA: Diagnosis not present

## 2020-05-29 DIAGNOSIS — G894 Chronic pain syndrome: Secondary | ICD-10-CM | POA: Diagnosis not present

## 2020-05-29 LAB — CBC
HCT: 23.5 % — ABNORMAL LOW (ref 39.0–52.0)
Hemoglobin: 8.4 g/dL — ABNORMAL LOW (ref 13.0–17.0)
MCH: 31.9 pg (ref 26.0–34.0)
MCHC: 35.7 g/dL (ref 30.0–36.0)
MCV: 89.4 fL (ref 80.0–100.0)
Platelets: 440 10*3/uL — ABNORMAL HIGH (ref 150–400)
RBC: 2.63 MIL/uL — ABNORMAL LOW (ref 4.22–5.81)
RDW: 19.8 % — ABNORMAL HIGH (ref 11.5–15.5)
WBC: 14.2 10*3/uL — ABNORMAL HIGH (ref 4.0–10.5)
nRBC: 1.5 % — ABNORMAL HIGH (ref 0.0–0.2)

## 2020-05-29 MED ORDER — ONDANSETRON HCL 4 MG/2ML IJ SOLN
4.0000 mg | Freq: Four times a day (QID) | INTRAMUSCULAR | Status: DC | PRN
  Administered 2020-05-30 – 2020-05-31 (×2): 4 mg via INTRAVENOUS
  Filled 2020-05-29 (×2): qty 2

## 2020-05-29 MED ORDER — NALOXONE HCL 0.4 MG/ML IJ SOLN: 0.4000 mg | INTRAMUSCULAR | Status: DC | PRN

## 2020-05-29 MED ORDER — DIPHENHYDRAMINE HCL 25 MG PO CAPS
25.0000 mg | ORAL_CAPSULE | ORAL | Status: DC | PRN
  Administered 2020-05-29: 25 mg via ORAL
  Filled 2020-05-29: qty 1

## 2020-05-29 MED ORDER — HYDROMORPHONE 1 MG/ML IV SOLN
INTRAVENOUS | Status: DC
  Administered 2020-05-29: 3 mg via INTRAVENOUS
  Administered 2020-05-29 – 2020-05-30 (×2): 3.5 mg via INTRAVENOUS
  Administered 2020-05-30: 6 mg via INTRAVENOUS
  Administered 2020-05-30: 6.5 mg via INTRAVENOUS
  Administered 2020-05-30: 4 mg via INTRAVENOUS
  Administered 2020-05-30: 3.5 mg via INTRAVENOUS
  Administered 2020-05-30: 6 mg via INTRAVENOUS
  Administered 2020-05-31: 6.5 mg via INTRAVENOUS
  Administered 2020-05-31: 5 mg via INTRAVENOUS
  Administered 2020-05-31: 4 mg via INTRAVENOUS
  Administered 2020-05-31: 2 mg via INTRAVENOUS
  Filled 2020-05-29 (×2): qty 30

## 2020-05-29 MED ORDER — POLYETHYLENE GLYCOL 3350 17 G PO PACK
17.0000 g | PACK | Freq: Every day | ORAL | Status: DC | PRN
  Administered 2020-05-29 – 2020-06-01 (×3): 17 g via ORAL
  Filled 2020-05-29 (×3): qty 1

## 2020-05-29 MED ORDER — SODIUM CHLORIDE 0.9% FLUSH: 9.0000 mL | INTRAVENOUS | Status: DC | PRN

## 2020-05-29 MED ORDER — ACETAMINOPHEN 500 MG PO TABS
1000.0000 mg | ORAL_TABLET | Freq: Four times a day (QID) | ORAL | Status: DC | PRN
  Administered 2020-05-29 – 2020-05-31 (×5): 1000 mg via ORAL
  Filled 2020-05-29 (×5): qty 2

## 2020-05-29 NOTE — Progress Notes (Signed)
Subjective: Matthew Frederick is a 19 year old male with a medical history significant for sickle cell disease was admitted for sickle cell pain crisis.  Patient continues to complain of pain primarily to low back, upper and lower extremities.  Pain intensity is 7/10 despite IV Dilaudid PCA.  Patient states that headache is improved from yesterday.  Patient underwent CT of head, shows no acute process.  He denies any blurred vision, dizziness, paresthesias, no urinary symptoms, nausea, vomiting, or diarrhea.  Objective:  Vital signs in last 24 hours:  Vitals:   05/29/20 0612 05/29/20 0753 05/29/20 1006 05/29/20 1205  BP: 118/74  123/77   Pulse: 80  86   Resp: 20 15 16 16   Temp: (!) 97.4 F (36.3 C)  98.7 F (37.1 C)   TempSrc: Oral     SpO2: 94% 95% 97% 94%  Weight:      Height:        Intake/Output from previous day:   Intake/Output Summary (Last 24 hours) at 05/29/2020 1320 Last data filed at 05/29/2020 0800 Gross per 24 hour  Intake 666.37 ml  Output 1100 ml  Net -433.63 ml    Physical Exam: General: Alert, awake, oriented x3, in no acute distress.  HEENT: [place]/AT PEERL, EOMI Neck: Trachea midline,  no masses, no thyromegal,y no JVD, no carotid bruit OROPHARYNX:  Moist, No exudate/ erythema/lesions.  Heart: Regular rate and rhythm, without murmurs, rubs, gallops, PMI non-displaced, no heaves or thrills on palpation.  Lungs: Clear to auscultation, no wheezing or rhonchi noted. No increased vocal fremitus resonant to percussion  Abdomen: Soft, nontender, nondistended, positive bowel sounds, no masses no hepatosplenomegaly noted..  Neuro: No focal neurological deficits noted cranial nerves II through XII grossly intact. DTRs 2+ bilaterally upper and lower extremities. Strength 5 out of 5 in bilateral upper and lower extremities. Musculoskeletal: No warm swelling or erythema around joints, no spinal tenderness noted. Psychiatric: Patient alert and oriented x3, good insight and  cognition, good recent to remote recall. Lymph node survey: No cervical axillary or inguinal lymphadenopathy noted.  Lab Results:  Basic Metabolic Panel:    Component Value Date/Time   NA 137 05/28/2020 0533   K 4.1 05/28/2020 0533   CL 104 05/28/2020 0533   CO2 26 05/28/2020 0533   BUN 10 05/28/2020 0533   CREATININE 0.62 05/28/2020 0533   GLUCOSE 95 05/28/2020 0533   CALCIUM 8.6 (L) 05/28/2020 0533   CBC:    Component Value Date/Time   WBC 14.2 (H) 05/29/2020 0635   HGB 8.4 (L) 05/29/2020 0635   HCT 23.5 (L) 05/29/2020 0635   PLT 440 (H) 05/29/2020 0635   MCV 89.4 05/29/2020 0635   NEUTROABS 11.9 (H) 05/27/2020 1812   LYMPHSABS 2.6 05/27/2020 1812   MONOABS 2.6 (H) 05/27/2020 1812   EOSABS 0.0 05/27/2020 1812   BASOSABS 0.1 05/27/2020 1812    Recent Results (from the past 240 hour(s))  Respiratory Panel by RT PCR (Flu A&B, Covid) - Nasopharyngeal Swab     Status: None   Collection Time: 05/27/20  7:35 PM   Specimen: Nasopharyngeal Swab  Result Value Ref Range Status   SARS Coronavirus 2 by RT PCR NEGATIVE NEGATIVE Final    Comment: (NOTE) SARS-CoV-2 target nucleic acids are NOT DETECTED.  The SARS-CoV-2 RNA is generally detectable in upper respiratoy specimens during the acute phase of infection. The lowest concentration of SARS-CoV-2 viral copies this assay can detect is 131 copies/mL. A negative result does not preclude SARS-Cov-2 infection and should  not be used as the sole basis for treatment or other patient management decisions. A negative result may occur with  improper specimen collection/handling, submission of specimen other than nasopharyngeal swab, presence of viral mutation(s) within the areas targeted by this assay, and inadequate number of viral copies (<131 copies/mL). A negative result must be combined with clinical observations, patient history, and epidemiological information. The expected result is Negative.  Fact Sheet for Patients:   https://www.moore.com/  Fact Sheet for Healthcare Providers:  https://www.young.biz/  This test is no t yet approved or cleared by the [place] FDA and  has been authorized for detection and/or diagnosis of SARS-CoV-2 by FDA under an Emergency Use Authorization (EUA). This EUA will remain  in effect (meaning this test can be used) for the duration of the COVID-19 declaration under Section 564(b)(1) of the Act, 21 U.S.C. section 360bbb-3(b)(1), unless the authorization is terminated or revoked sooner.     Influenza A by PCR NEGATIVE NEGATIVE Final   Influenza B by PCR NEGATIVE NEGATIVE Final    Comment: (NOTE) The Xpert Xpress SARS-CoV-2/FLU/RSV assay is intended as an aid in  the diagnosis of influenza from Nasopharyngeal swab specimens and  should not be used as a sole basis for treatment. Nasal washings and  aspirates are unacceptable for Xpert Xpress SARS-CoV-2/FLU/RSV  testing.  Fact Sheet for Patients: https://www.moore.com/  Fact Sheet for Healthcare Providers: https://www.young.biz/  This test is not yet approved or cleared by the [place] FDA and  has been authorized for detection and/or diagnosis of SARS-CoV-2 by  FDA under an Emergency Use Authorization (EUA). This EUA will remain  in effect (meaning this test can be used) for the duration of the  Covid-19 declaration under Section 564(b)(1) of the Act, 21  U.S.C. section 360bbb-3(b)(1), unless the authorization is  terminated or revoked. Performed at [facility], [address] [place], [place] [address]     Studies/Results: CT HEAD WO CONTRAST  Result Date: 05/28/2020 CLINICAL DATA:  19 year old male with history of headache for the past 2 days. EXAM: CT HEAD WITHOUT CONTRAST TECHNIQUE: Contiguous axial images were obtained from the base of the skull through the vertex without intravenous contrast.  COMPARISON:  No priors. FINDINGS: Brain: No evidence of acute infarction, hemorrhage, hydrocephalus, extra-axial collection or mass lesion/mass effect. Vascular: No hyperdense vessel or unexpected calcification. Skull: Normal. Negative for fracture or focal lesion. Sinuses/Orbits: No acute finding. Other: None. IMPRESSION: 1. No acute intracranial abnormalities. The appearance of the brain is normal. Electronically Signed   By: Trudie Reed M.D.   On: 05/28/2020 14:31    Medications: Scheduled Meds: . docusate sodium  100 mg Oral BID  . enoxaparin (LOVENOX) injection  40 mg Subcutaneous Q24H  . folic acid  1 mg Oral QHS  . HYDROmorphone   Intravenous Q4H  . hydroxyurea  1,500 mg Oral QHS  . ketorolac  15 mg Intravenous Q6H  . pantoprazole  40 mg Oral Daily   Continuous Infusions: . sodium chloride 100 mL/hr at 05/29/20 0939   PRN Meds:.acetaminophen, diphenhydrAMINE, naloxone **AND** sodium chloride flush, oxyCODONE, polyethylene glycol  Consultants:  None  Procedures:  None  Antibiotics:   Assessment/Plan: Principal Problem:   Vasoocclusive sickle cell crisis (HCC) Active Problems:   Leukocytosis   Acute intractable headache   Chronic pain syndrome  Sickle cell disease with pain crisis: Continue ibuprofen continue.  Increase settings of IV Dilaudid PCA to 0.5 mg, 10-minute lockout, and 2 mg/h. IV fluids, 0.45% saline  at [facility] Toradol 15 mg IV every 6 hours with total of 5 days Monitor vital signs closely, reevaluate pain scale regularly, and supplemental needed.  Intractable headache: Headache improving.  Reviewed CT of head, no acute abnormalities.  Continue Tylenol 650 mg every 6 hours as needed for headache  Leukocytosis:  WBCs stable.  Improved from previous.  Patient is afebrile without any signs of infection or inflammation.  Continue to follow closely without antibiotics.  Sickle cell anemia: Hemoglobin stable and consistent with patient's baseline.  There  is no clinical indication for blood transfusion at this time.  Repeat CBC in a.m.  Chronic pain syndrome: Continue home medications   Code Status: Full Code Family Communication: N/A Disposition Plan: Not yet ready for discharge  Shekelia Boutin Rennis Petty  APRN, MSN, FNP-C [facility] [address] [place], [place] [address] [phone]  If 5PM-7AM, please contact night-coverage.  05/29/2020, 1:20 PM  LOS: 2 days

## 2020-05-30 DIAGNOSIS — D5700 Hb-SS disease with crisis, unspecified: Secondary | ICD-10-CM | POA: Diagnosis not present

## 2020-05-30 MED ORDER — POLYVINYL ALCOHOL 1.4 % OP SOLN
1.0000 [drp] | OPHTHALMIC | Status: DC | PRN
  Filled 2020-05-30: qty 15

## 2020-05-30 NOTE — Progress Notes (Signed)
Subjective: Matthew Frederick is a 19 year old male with a medical history significant for sickle cell disease was admitted for sickle cell pain crisis. Patient continues to complain of pain primarily to low back and lower extremities.  He also endorses headache, which is slightly improved from yesterday.  Patient underwent CT of the head, shows no acute process.  He currently denies any dizziness, blurred vision, paresthesias, syncope, no urinary symptoms, nausea, vomiting, or diarrhea.  Patient is afebrile and oxygen saturation is 100% on RA.  Objective:  Vital signs in last 24 hours:  Vitals:   05/31/20 2312 06/01/20 0001 06/01/20 0415 06/01/20 0455  BP:  (!) 117/52 121/73   Pulse:  63 (!) 57   Resp: 15 14 14 14   Temp:  97.8 F (36.6 C) 97.7 F (36.5 C)   TempSrc:  Oral Oral   SpO2: 100% 97% 98% 100%  Weight:      Height:        Intake/Output from previous day:   Intake/Output Summary (Last 24 hours) at 06/01/2020 0553 Last data filed at 06/01/2020 0549 Gross per 24 hour  Intake 440 ml  Output 1700 ml  Net -1260 ml    Physical Exam: General: Alert, awake, oriented x3, in no acute distress.  HEENT: [place]/AT PEERL, EOMI Neck: Trachea midline,  no masses, no thyromegal,y no JVD, no carotid bruit OROPHARYNX:  Moist, No exudate/ erythema/lesions.  Heart: Regular rate and rhythm, without murmurs, rubs, gallops, PMI non-displaced, no heaves or thrills on palpation.  Lungs: Clear to auscultation, no wheezing or rhonchi noted. No increased vocal fremitus resonant to percussion  Abdomen: Soft, nontender, nondistended, positive bowel sounds, no masses no hepatosplenomegaly noted..  Neuro: No focal neurological deficits noted cranial nerves II through XII grossly intact. DTRs 2+ bilaterally upper and lower extremities. Strength 5 out of 5 in bilateral upper and lower extremities. Musculoskeletal: No warm swelling or erythema around joints, no spinal tenderness noted. Psychiatric: Patient  alert and oriented x3, good insight and cognition, good recent to remote recall. Lymph node survey: No cervical axillary or inguinal lymphadenopathy noted.  Lab Results:  Basic Metabolic Panel:    Component Value Date/Time   NA 137 05/28/2020 0533   K 4.1 05/28/2020 0533   CL 104 05/28/2020 0533   CO2 26 05/28/2020 0533   BUN 10 05/28/2020 0533   CREATININE 0.62 05/28/2020 0533   GLUCOSE 95 05/28/2020 0533   CALCIUM 8.6 (L) 05/28/2020 0533   CBC:    Component Value Date/Time   WBC 12.4 (H) 05/31/2020 0612   HGB 8.0 (L) 05/31/2020 0612   HCT 21.9 (L) 05/31/2020 0612   PLT 478 (H) 05/31/2020 0612   MCV 88.3 05/31/2020 0612   NEUTROABS 11.9 (H) 05/27/2020 1812   LYMPHSABS 2.6 05/27/2020 1812   MONOABS 2.6 (H) 05/27/2020 1812   EOSABS 0.0 05/27/2020 1812   BASOSABS 0.1 05/27/2020 1812    Recent Results (from the past 240 hour(s))  Respiratory Panel by RT PCR (Flu A&B, Covid) - Nasopharyngeal Swab     Status: None   Collection Time: 05/27/20  7:35 PM   Specimen: Nasopharyngeal Swab  Result Value Ref Range Status   SARS Coronavirus 2 by RT PCR NEGATIVE NEGATIVE Final    Comment: (NOTE) SARS-CoV-2 target nucleic acids are NOT DETECTED.  The SARS-CoV-2 RNA is generally detectable in upper respiratoy specimens during the acute phase of infection. The lowest concentration of SARS-CoV-2 viral copies this assay can detect is 131 copies/mL. A negative result does  not preclude SARS-Cov-2 infection and should not be used as the sole basis for treatment or other patient management decisions. A negative result may occur with  improper specimen collection/handling, submission of specimen other than nasopharyngeal swab, presence of viral mutation(s) within the areas targeted by this assay, and inadequate number of viral copies (<131 copies/mL). A negative result must be combined with clinical observations, patient history, and epidemiological information. The expected result is  Negative.  Fact Sheet for Patients:  https://www.moore.com/  Fact Sheet for Healthcare Providers:  https://www.young.biz/  This test is no t yet approved or cleared by the [place] FDA and  has been authorized for detection and/or diagnosis of SARS-CoV-2 by FDA under an Emergency Use Authorization (EUA). This EUA will remain  in effect (meaning this test can be used) for the duration of the COVID-19 declaration under Section 564(b)(1) of the Act, 21 U.S.C. section 360bbb-3(b)(1), unless the authorization is terminated or revoked sooner.     Influenza A by PCR NEGATIVE NEGATIVE Final   Influenza B by PCR NEGATIVE NEGATIVE Final    Comment: (NOTE) The Xpert Xpress SARS-CoV-2/FLU/RSV assay is intended as an aid in  the diagnosis of influenza from Nasopharyngeal swab specimens and  should not be used as a sole basis for treatment. Nasal washings and  aspirates are unacceptable for Xpert Xpress SARS-CoV-2/FLU/RSV  testing.  Fact Sheet for Patients: https://www.moore.com/  Fact Sheet for Healthcare Providers: https://www.young.biz/  This test is not yet approved or cleared by the [place] FDA and  has been authorized for detection and/or diagnosis of SARS-CoV-2 by  FDA under an Emergency Use Authorization (EUA). This EUA will remain  in effect (meaning this test can be used) for the duration of the  Covid-19 declaration under Section 564(b)(1) of the Act, 21  U.S.C. section 360bbb-3(b)(1), unless the authorization is  terminated or revoked. Performed at [facility], [address] [place], [place] [address]     Studies/Results: No results found.  Medications: Scheduled Meds:  docusate sodium  100 mg Oral BID   enoxaparin (LOVENOX) injection  40 mg Subcutaneous Q24H   folic acid  1 mg Oral QHS   hydroxyurea  1,500 mg Oral QHS   ketorolac  15 mg Intravenous Q6H    morphine   Intravenous Q4H   pantoprazole  40 mg Oral Daily   Continuous Infusions:  sodium chloride 10 mL/hr at 05/30/20 1448   PRN Meds:.acetaminophen, diphenhydrAMINE, naloxone **AND** sodium chloride flush, ondansetron (ZOFRAN) IV, oxyCODONE, polyethylene glycol, polyvinyl alcohol  Consultants:  None  Procedures:  None  Antibiotics:  None  Assessment/Plan: Principal Problem:   Vasoocclusive sickle cell crisis (HCC) Active Problems:   Leukocytosis   Acute intractable headache   Chronic pain syndrome  Sickle cell disease with pain crisis: Continue IV Dilaudid PCA, no changes in settings on today Continue IV fluids at [facility] Toradol 15 mg IV every 6 hours for total of 5 days Monitor vital signs closely, reevaluate pain scale regularly, and supplemental oxygen as needed.  Intractable headache: Headache has improved some overnight.  CT shows no acute abnormalities.  Continue Tylenol 1000 mg every 6 hours as needed  Leukocytosis: Stable.  Patient is afebrile without any signs of infection or inflammation.  Continue to follow closely without antibiotics.  Sickle cell anemia: Hemoglobin is stable and consistent with baseline.  There is no clinical indication for blood transfusion at this time.  Continue to follow closely.  CBC in a.m.  Chronic pain syndrome:  Continue home medications Code Status: Full Code Family Communication: N/A Disposition Plan: Not yet ready for discharge  Nettie Wyffels Rennis Petty  APRN, MSN, FNP-C [facility] [address] [place], [place] [address] [phone]  If 5PM-8AM, please contact night-coverage.  06/01/2020, 5:53 AM  LOS: 5 days

## 2020-05-31 DIAGNOSIS — D5700 Hb-SS disease with crisis, unspecified: Secondary | ICD-10-CM | POA: Diagnosis not present

## 2020-05-31 LAB — CBC
HCT: 21.9 % — ABNORMAL LOW (ref 39.0–52.0)
Hemoglobin: 8 g/dL — ABNORMAL LOW (ref 13.0–17.0)
MCH: 32.3 pg (ref 26.0–34.0)
MCHC: 36.5 g/dL — ABNORMAL HIGH (ref 30.0–36.0)
MCV: 88.3 fL (ref 80.0–100.0)
Platelets: 478 10*3/uL — ABNORMAL HIGH (ref 150–400)
RBC: 2.48 MIL/uL — ABNORMAL LOW (ref 4.22–5.81)
RDW: 18.3 % — ABNORMAL HIGH (ref 11.5–15.5)
WBC: 12.4 10*3/uL — ABNORMAL HIGH (ref 4.0–10.5)
nRBC: 1.2 % — ABNORMAL HIGH (ref 0.0–0.2)

## 2020-05-31 MED ORDER — DIPHENHYDRAMINE HCL 25 MG PO CAPS: 25.0000 mg | ORAL_CAPSULE | ORAL | Status: DC | PRN

## 2020-05-31 MED ORDER — ONDANSETRON HCL 4 MG/2ML IJ SOLN: 4.0000 mg | Freq: Four times a day (QID) | INTRAMUSCULAR | Status: DC | PRN

## 2020-05-31 MED ORDER — NALOXONE HCL 0.4 MG/ML IJ SOLN: 0.4000 mg | INTRAMUSCULAR | Status: DC | PRN

## 2020-05-31 MED ORDER — MORPHINE SULFATE 2 MG/ML IV SOLN
INTRAVENOUS | Status: DC
  Administered 2020-05-31: 10.5 mg via INTRAVENOUS
  Administered 2020-05-31: 12 mg via INTRAVENOUS
  Administered 2020-06-01: 10.5 mg via INTRAVENOUS
  Administered 2020-06-01: 3 mg via INTRAVENOUS
  Administered 2020-06-01: 0 mg via INTRAVENOUS
  Filled 2020-05-31 (×2): qty 25

## 2020-05-31 MED ORDER — SODIUM CHLORIDE 0.9% FLUSH: 9.0000 mL | INTRAVENOUS | Status: DC | PRN

## 2020-05-31 MED ORDER — KETOROLAC TROMETHAMINE 30 MG/ML IJ SOLN
15.0000 mg | Freq: Four times a day (QID) | INTRAMUSCULAR | Status: DC
  Administered 2020-05-31 – 2020-06-01 (×4): 15 mg via INTRAVENOUS
  Filled 2020-05-31 (×5): qty 1

## 2020-05-31 NOTE — Progress Notes (Signed)
Subjective: Patient continues to complain of increased pain to low back and lower extremities.  He rates pain as 9/10 without any improvement overnight.  He also continues to endorse frontal headache.  He denies any chest pain, shortness of breath, dizziness, paresthesias, blurred vision, urinary symptoms, nausea, vomiting, or diarrhea. Patient is afebrile without any signs of infection or inflammation.  Objective:  Vital signs in last 24 hours:  Vitals:   05/31/20 2312 06/01/20 0001 06/01/20 0415 06/01/20 0455  BP:  (!) 117/52 121/73   Pulse:  63 (!) 57   Resp: 15 14 14 14   Temp:  97.8 F (36.6 C) 97.7 F (36.5 C)   TempSrc:  Oral Oral   SpO2: 100% 97% 98% 100%  Weight:      Height:        Intake/Output from previous day:   Intake/Output Summary (Last 24 hours) at 06/01/2020 0600 Last data filed at 06/01/2020 0549 Gross per 24 hour  Intake 440 ml  Output 1250 ml  Net -810 ml    Physical Exam: General: Alert, awake, oriented x3, in no acute distress.  HEENT: [place]/AT PEERL, EOMI Neck: Trachea midline,  no masses, no thyromegal,y no JVD, no carotid bruit OROPHARYNX:  Moist, No exudate/ erythema/lesions.  Heart: Regular rate and rhythm, without murmurs, rubs, gallops, PMI non-displaced, no heaves or thrills on palpation.  Lungs: Clear to auscultation, no wheezing or rhonchi noted. No increased vocal fremitus resonant to percussion  Abdomen: Soft, nontender, nondistended, positive bowel sounds, no masses no hepatosplenomegaly noted..  Neuro: No focal neurological deficits noted cranial nerves II through XII grossly intact. DTRs 2+ bilaterally upper and lower extremities. Strength 5 out of 5 in bilateral upper and lower extremities. Musculoskeletal: No warm swelling or erythema around joints, no spinal tenderness noted. Psychiatric: Patient alert and oriented x3, good insight and cognition, good recent to remote recall. Lymph node survey: No cervical axillary or inguinal  lymphadenopathy noted.  Lab Results:  Basic Metabolic Panel:    Component Value Date/Time   NA 137 05/28/2020 0533   K 4.1 05/28/2020 0533   CL 104 05/28/2020 0533   CO2 26 05/28/2020 0533   BUN 10 05/28/2020 0533   CREATININE 0.62 05/28/2020 0533   GLUCOSE 95 05/28/2020 0533   CALCIUM 8.6 (L) 05/28/2020 0533   CBC:    Component Value Date/Time   WBC 12.4 (H) 05/31/2020 0612   HGB 8.0 (L) 05/31/2020 0612   HCT 21.9 (L) 05/31/2020 0612   PLT 478 (H) 05/31/2020 0612   MCV 88.3 05/31/2020 0612   NEUTROABS 11.9 (H) 05/27/2020 1812   LYMPHSABS 2.6 05/27/2020 1812   MONOABS 2.6 (H) 05/27/2020 1812   EOSABS 0.0 05/27/2020 1812   BASOSABS 0.1 05/27/2020 1812    Recent Results (from the past 240 hour(s))  Respiratory Panel by RT PCR (Flu A&B, Covid) - Nasopharyngeal Swab     Status: None   Collection Time: 05/27/20  7:35 PM   Specimen: Nasopharyngeal Swab  Result Value Ref Range Status   SARS Coronavirus 2 by RT PCR NEGATIVE NEGATIVE Final    Comment: (NOTE) SARS-CoV-2 target nucleic acids are NOT DETECTED.  The SARS-CoV-2 RNA is generally detectable in upper respiratoy specimens during the acute phase of infection. The lowest concentration of SARS-CoV-2 viral copies this assay can detect is 131 copies/mL. A negative result does not preclude SARS-Cov-2 infection and should not be used as the sole basis for treatment or other patient management decisions. A negative result may occur  with  improper specimen collection/handling, submission of specimen other than nasopharyngeal swab, presence of viral mutation(s) within the areas targeted by this assay, and inadequate number of viral copies (<131 copies/mL). A negative result must be combined with clinical observations, patient history, and epidemiological information. The expected result is Negative.  Fact Sheet for Patients:  https://www.moore.com/  Fact Sheet for Healthcare Providers:   https://www.young.biz/  This test is no t yet approved or cleared by the [place] FDA and  has been authorized for detection and/or diagnosis of SARS-CoV-2 by FDA under an Emergency Use Authorization (EUA). This EUA will remain  in effect (meaning this test can be used) for the duration of the COVID-19 declaration under Section 564(b)(1) of the Act, 21 U.S.C. section 360bbb-3(b)(1), unless the authorization is terminated or revoked sooner.     Influenza A by PCR NEGATIVE NEGATIVE Final   Influenza B by PCR NEGATIVE NEGATIVE Final    Comment: (NOTE) The Xpert Xpress SARS-CoV-2/FLU/RSV assay is intended as an aid in  the diagnosis of influenza from Nasopharyngeal swab specimens and  should not be used as a sole basis for treatment. Nasal washings and  aspirates are unacceptable for Xpert Xpress SARS-CoV-2/FLU/RSV  testing.  Fact Sheet for Patients: https://www.moore.com/  Fact Sheet for Healthcare Providers: https://www.young.biz/  This test is not yet approved or cleared by the [place] FDA and  has been authorized for detection and/or diagnosis of SARS-CoV-2 by  FDA under an Emergency Use Authorization (EUA). This EUA will remain  in effect (meaning this test can be used) for the duration of the  Covid-19 declaration under Section 564(b)(1) of the Act, 21  U.S.C. section 360bbb-3(b)(1), unless the authorization is  terminated or revoked. Performed at [facility], [address] [place], [place] [address]     Studies/Results: No results found.  Medications: Scheduled Meds: . docusate sodium  100 mg Oral BID  . enoxaparin (LOVENOX) injection  40 mg Subcutaneous Q24H  . folic acid  1 mg Oral QHS  . hydroxyurea  1,500 mg Oral QHS  . ketorolac  15 mg Intravenous Q6H  . morphine   Intravenous Q4H  . pantoprazole  40 mg Oral Daily   Continuous Infusions: . sodium chloride 10 mL/hr at  05/30/20 1448   PRN Meds:.acetaminophen, diphenhydrAMINE, naloxone **AND** sodium chloride flush, ondansetron (ZOFRAN) IV, oxyCODONE, polyethylene glycol, polyvinyl alcohol  Consultants:  None  Procedures:  None  Antibiotics:  None  Assessment/Plan: Principal Problem:   Vasoocclusive sickle cell crisis (HCC) Active Problems:   Leukocytosis   Acute intractable headache   Chronic pain syndrome  Sickle cell disease with pain crisis: Patient continues to complain of headache.  Discontinue IV Dilaudid PCA.  Initiate morphine PCA per weight-based protocol. Continue Toradol 15 mg IV every 6 hours for total of 5 days Oxycodone 10 mg every 4 hours as needed for severe breakthrough pain Monitor vital signs closely, reevaluate pain scale regularly, and supplemental oxygen as needed.  Intractable headache: We will discontinue IV Dilaudid and initiate PCA morphine.  Tylenol 1000 mg every 6 hours as needed.  CT of head shows no acute abnormalities.  Sickle cell anemia: Hemoglobin 8.0, consistent with patient's baseline.  There is no clinical indication for blood transfusion at this time.  Continue to follow closely.  Will consider exchange transfusion if severity of pain persists.  Leukocytosis: Stable.  Improving.  Patient afebrile without any signs of infection or inflammation.  More than likely secondary to vaso-occlusive pain crisis.  Continue to follow closely.  Chronic pain syndrome: Continue home medications.   Code Status: Full Code Family Communication: Discussed patient's plan of care with his mother, Manus Gunning. Disposition Plan: Not yet ready for discharge  [name]  APRN, MSN, FNP-C Patient Care [facility] [address] [place], [place] [address] [phone]  If 5PM-8AM, please contact night-coverage.  06/01/2020, 6:00 AM  LOS: 5 days

## 2020-06-01 DIAGNOSIS — D5700 Hb-SS disease with crisis, unspecified: Secondary | ICD-10-CM | POA: Diagnosis not present

## 2020-06-01 MED ORDER — OXYCODONE HCL 10 MG PO TABS
10.0000 mg | ORAL_TABLET | ORAL | 0 refills | Status: DC | PRN
Start: 2020-06-01 — End: 2020-10-02

## 2020-06-01 MED ORDER — ACETAMINOPHEN 500 MG PO TABS: 1000.0000 mg | ORAL_TABLET | Freq: Four times a day (QID) | ORAL | 0 refills | Status: AC | PRN

## 2020-06-01 MED ORDER — IBUPROFEN 800 MG PO TABS: 800.0000 mg | ORAL_TABLET | Freq: Three times a day (TID) | ORAL | 0 refills | Status: DC | PRN

## 2020-06-01 NOTE — Consult Note (Signed)
   [facility] Inpatient Consult   06/01/2020  Matthew Frederick [birth year] [id number]   Patient chart reviewed for potential [facility] Care Management [facility] CM) services due to high unplanned readmission risk score. Patient is eligible for outpatient care coordination services under Managed Medicaid Program. Spoke with patient via telephone. HIPAA verified.  Explained that an attempt to contact him had been made in order to explain care management and care coordination benefit and a message had been left. Patient verifies that number on file is correct. Denies any needs at this time. Explained that he may receive a post hospital follow up from [facility] RN care coordinator on Managed Medicaid team. Patient verbalizes understanding.  Of note, [facility] Care Management services does not replace or interfere with any services that are arranged by inpatient case management or social work.  Christophe Louis, MSN, RN [facility] Liaison Nurse Mobile Phone [phone]  Toll free office [phone]

## 2020-06-01 NOTE — Discharge Summary (Signed)
Physician Discharge Summary  Matthew HallsJulian Wayne Frederick ZOX:[id number] DOB: 2001-02-02 DOA: 05/27/2020  PCP: Matthew Frederick  Admit date: 05/27/2020  Discharge date: 06/01/2020  Discharge Diagnoses:  Principal Problem:   Vasoocclusive sickle cell crisis (HCC) Active Problems:   Leukocytosis   Acute intractable headache   Chronic pain syndrome   Discharge Condition: Stable  Disposition:   Follow-up Information    Matthew Frederick Follow up.   Specialty: Family Medicine Contact information: [address] [place] [address] [phone]        Massie MaroonHollis, Christi Wirick M, FNP Follow up.   Specialty: Family Medicine Contact information: [address] [place] [address] [phone]              Pt is discharged home in good condition and is to follow up with Matthew Frederick this week to have labs evaluated. [name] is instructed to increase activity slowly and balance with rest for the next few days, and use prescribed medication to complete treatment of pain  Diet: Regular Wt Readings from Last 3 Encounters:  05/31/20 61.5 kg (19 %, Z= -0.88)*  05/26/20 65.8 kg (34 %, Z= -0.42)*  02/21/20 59.9 kg (15 %, Z= -1.03)*   * Growth percentiles are based on CDC (Boys, 2-20 Years) data.    History of present illness:  Matthew GlowJulian Wayne Frederick is a 19 year old male with a past medical history of sickle cell disease presented to hospital with complaints of the right upper extremity pain especially over the wrist and arm moderate intensity sharp in nature with some tingling sensation.  He also complains of right lower extremity pain that is consistent with his typical pain crisis.  Patient states that he gets crises approximately once a month or so.  He follows up with [facility]ology and takes hydroxyurea, hydrocodone/oxycodone as an outpatient.  Patient stated that he was helping at his friend's wedding and had to be out shopping and was exposed to cold  which could have contributed to his pain crisis at this time.  He denies any fever, chills, nausea, vomiting, or abdominal pain.  Denies any urinary urgency, frequency, or dysuria.  Denies any dizziness, lightheadedness, shortness of breath, chest pain, or palpitations.  Denies syncope or lightheadedness.  Denies any sick contacts.  Patient has had vaccinations in the past.  He complains of a migrainous headache associated with the pain, which is typical for him.  ER course: In the ER, patient was in painful crisis.  Received Dilaudid 1 mg x 2, Toradol 30 mg x 1, Reglan, and Benadryl.  Patient still persisted to have pain so he was considered for admission to the hospital for sickle cell pain crisis.  Hospital Course:   Sickle cell disease with pain crisis: Patient was admitted for sickle cell pain crisis and managed appropriately with IVF, IV Dilaudid via PCA and IV Toradol, as well as other adjunct therapies per sickle cell pain management protocols.  Pain did not respond well to IV Dilaudid, patient was transitioned to IV morphine PCA, which decreased pain.  Patient is requesting discharge home.  He states that pain is 3/10 primarily to right lower extremity.  Patient does not have home pain medications and does not have scheduled follow-up with PCP.  Oxycodone 10 mg every 4 hours #30 was sent to patient's pharmacy.  Also, ibuprofen and Tylenol were sent.  Discussed medication regimen at length.  PDMP, substance reporting system, was reviewed prior to prescribing opiate  medication, no inconsistencies noted.  Intractable headache: On admission, patient was complaining of frontal headache.  Patient has had migraine headaches in the past, but stated this headache was been ongoing and different from previous headaches.  Initially patient reported some dizziness when transitioning from sitting to standing which is since resolved.  Patient underwent CT of head, which showed no acute process.  Headache is  resolved.  Patient advised to continue Tylenol 1000 mg every 6 hours as needed for headache.  Patient was advised to follow-up with PCP within 2 weeks.  Repeat CBC with differential at that time.  At discharge, hemoglobin 8.0 g/dL, appears to be consistent with patient's baseline.  Patient was therefore discharged home today in a hemodynamically stable condition.   Matthew Frederick will follow-up with PCP within 1 week of this discharge. Matthew Frederick was counseled extensively about nonpharmacologic means of pain management, patient verbalized understanding and was appreciative of  the care received during this admission.   We discussed the need for good hydration, monitoring of hydration status, avoidance of heat, cold, stress, and infection triggers. We discussed the need to be adherent with taking Hydrea and other home medications. Patient was reminded of the need to seek medical attention immediately if any symptom of bleeding, anemia, or infection occurs.  Patient also advised to follow-up with [facility] hematology as scheduled.  Discharge Exam: Vitals:   06/01/20 0810 06/01/20 1136  BP:  120/66  Pulse:  68  Resp: 12 15  Temp:  98.9 F (37.2 C)  SpO2: 100% 99%   Vitals:   06/01/20 0415 06/01/20 0455 06/01/20 0810 06/01/20 1136  BP: 121/73   120/66  Pulse: (!) 57   68  Resp: 14 14 12 15   Temp: 97.7 F (36.5 C)   98.9 F (37.2 C)  TempSrc: Oral   Oral  SpO2: 98% 100% 100% 99%  Weight:      Height:        General appearance : Awake, alert, not in any distress. Speech Clear. Not toxic looking HEENT: Atraumatic and Normocephalic, pupils equally reactive to light and accomodation Neck: Supple, no JVD. No cervical lymphadenopathy.  Chest: Good air entry bilaterally, no added sounds  CVS: S1 S2 regular, no murmurs.  Abdomen: Bowel sounds present, Non tender and not distended with no gaurding, rigidity or rebound. Extremities: B/L Lower Ext shows no edema, both legs are warm to touch Neurology: Awake  alert, and oriented X 3, CN II-XII intact, Non focal Skin: No Rash  Discharge Instructions  Discharge Instructions    Discharge patient   Complete by: As directed    Discharge disposition: 01-Home or Self Care   Discharge patient date: 06/01/2020     Allergies as of 06/01/2020   No Known Allergies     Medication List    TAKE these medications   acetaminophen 500 MG tablet Commonly known as: TYLENOL Take 2 tablets (1,000 mg total) by mouth every 6 (six) hours as needed for headache.   folic acid 1 MG tablet Commonly known as: FOLVITE Take 1 mg by mouth at bedtime.   HYDROcodone-acetaminophen 5-325 MG tablet Commonly known as: NORCO/VICODIN Take 1 tablet by mouth every 6 (six) hours as needed for severe pain.   hydroxyurea 500 MG capsule Commonly known as: HYDREA Take 1,500 mg by mouth at bedtime.   ibuprofen 800 MG tablet Commonly known as: ADVIL Take 1 tablet (800 mg total) by mouth every 8 (eight) hours as needed.   Oxycodone HCl 10 MG Tabs  Take 1 tablet (10 mg total) by mouth every 4 (four) hours as needed. What changed:   when to take this  Another medication with the same name was removed. Continue taking this medication, and follow the directions you see here.       The results of significant diagnostics from this hospitalization (including imaging, microbiology, ancillary and laboratory) are listed below for reference.    Significant Diagnostic Studies: CT HEAD WO CONTRAST  Result Date: 05/28/2020 CLINICAL DATA:  19 year old male with history of headache for the past 2 days. EXAM: CT HEAD WITHOUT CONTRAST TECHNIQUE: Contiguous axial images were obtained from the base of the skull through the vertex without intravenous contrast. COMPARISON:  No priors. FINDINGS: Brain: No evidence of acute infarction, hemorrhage, hydrocephalus, extra-axial collection or mass lesion/mass effect. Vascular: No hyperdense vessel or unexpected calcification. Skull: Normal.  Negative for fracture or focal lesion. Sinuses/Orbits: No acute finding. Other: None. IMPRESSION: 1. No acute intracranial abnormalities. The appearance of the brain is normal. Electronically Signed   By: Trudie Reed M.D.   On: 05/28/2020 14:31    Microbiology: Recent Results (from the past 240 hour(s))  Respiratory Panel by RT PCR (Flu A&B, Covid) - Nasopharyngeal Swab     Status: None   Collection Time: 05/27/20  7:35 PM   Specimen: Nasopharyngeal Swab  Result Value Ref Range Status   SARS Coronavirus 2 by RT PCR NEGATIVE NEGATIVE Final    Comment: (NOTE) SARS-CoV-2 target nucleic acids are NOT DETECTED.  The SARS-CoV-2 RNA is generally detectable in upper respiratoy specimens during the acute phase of infection. The lowest concentration of SARS-CoV-2 viral copies this assay can detect is 131 copies/mL. A negative result does not preclude SARS-Cov-2 infection and should not be used as the sole basis for treatment or other patient management decisions. A negative result may occur with  improper specimen collection/handling, submission of specimen other than nasopharyngeal swab, presence of viral mutation(s) within the areas targeted by this assay, and inadequate number of viral copies (<131 copies/mL). A negative result must be combined with clinical observations, patient history, and epidemiological information. The expected result is Negative.  Fact Sheet for Patients:  https://www.moore.com/  Fact Sheet for Healthcare Providers:  https://www.young.biz/  This test is no t yet approved or cleared by the [place] FDA and  has been authorized for detection and/or diagnosis of SARS-CoV-2 by FDA under an Emergency Use Authorization (EUA). This EUA will remain  in effect (meaning this test can be used) for the duration of the COVID-19 declaration under Section 564(b)(1) of the Act, 21 U.S.C. section 360bbb-3(b)(1), unless the  authorization is terminated or revoked sooner.     Influenza A by PCR NEGATIVE NEGATIVE Final   Influenza B by PCR NEGATIVE NEGATIVE Final    Comment: (NOTE) The Xpert Xpress SARS-CoV-2/FLU/RSV assay is intended as an aid in  the diagnosis of influenza from Nasopharyngeal swab specimens and  should not be used as a sole basis for treatment. Nasal washings and  aspirates are unacceptable for Xpert Xpress SARS-CoV-2/FLU/RSV  testing.  Fact Sheet for Patients: https://www.moore.com/  Fact Sheet for Healthcare Providers: https://www.young.biz/  This test is not yet approved or cleared by the [place] FDA and  has been authorized for detection and/or diagnosis of SARS-CoV-2 by  FDA under an Emergency Use Authorization (EUA). This EUA will remain  in effect (meaning this test can be used) for the duration of the  Covid-19 declaration under Section 564(b)(1) of the Act, 21  U.S.C. section 360bbb-3(b)(1), unless the authorization is  terminated or revoked. Performed at [facility], [address], [place], [place] [address]      Labs: Basic Metabolic Panel: Recent Labs  Lab 05/26/20 0438 05/26/20 0438 05/27/20 1812 05/28/20 0533  NA 138  --  136 137  K 3.7   < > 4.7 4.1  CL 102  --  100 104  CO2 24  --  27 26  GLUCOSE 104*  --  92 95  BUN 10  --  9 10  CREATININE 0.67  --  0.62 0.62  CALCIUM 9.2  --  9.3 8.6*   < > = values in this interval not displayed.   Liver Function Tests: Recent Labs  Lab 05/26/20 0438 05/27/20 1812 05/28/20 0533  AST 30 27 27   ALT 17 14 14   ALKPHOS 105 103 84  BILITOT 5.4* 8.3* 5.9*  PROT 8.0 8.0 7.1  ALBUMIN 4.3 4.3 3.6   No results for input(s): LIPASE, AMYLASE in the last 168 hours. No results for input(s): AMMONIA in the last 168 hours. CBC: Recent Labs  Lab 05/26/20 0438 05/27/20 1812 05/28/20 0533 05/29/20 0635 05/31/20 0612  WBC 16.5* 17.4* 15.8* 14.2* 12.4*   NEUTROABS 10.0* 11.9*  --   --   --   HGB 10.8* 10.6* 9.7* 8.4* 8.0*  HCT 30.2* 29.0* 26.8* 23.5* 21.9*  MCV 89.1 88.1 89.3 89.4 88.3  PLT 552* 503* 466* 440* 478*   Cardiac Enzymes: No results for input(s): CKTOTAL, CKMB, CKMBINDEX, TROPONINI in the last 168 hours. BNP: Invalid input(s): POCBNP CBG: No results for input(s): GLUCAP in the last 168 hours.  Time coordinating discharge: 35 minutes  Signed:  05/31/20  APRN, MSN, FNP-C Patient Care [facility] [address], [address] [place] [phone]  [facility] 06/01/2020, 12:57 PM

## 2020-06-01 NOTE — Discharge Instructions (Signed)
Sickle Cell Anemia, Adult  Sickle cell anemia is a condition where your red blood cells are shaped like sickles. Red blood cells carry oxygen through the body. Sickle-shaped cells do not live as long as normal red blood cells. They also clump together and block blood from flowing through the blood vessels. This prevents the body from getting enough oxygen. Sickle cell anemia causes organ damage and pain. It also increases the risk of infection. Follow these instructions at home: Medicines  Take over-the-counter and prescription medicines only as told by your doctor.  If you were prescribed an antibiotic medicine, take it as told by your doctor. Do not stop taking the antibiotic even if you start to feel better.  If you develop a fever, do not take medicines to lower the fever right away. Tell your doctor about the fever. Managing pain, stiffness, and swelling  Try these methods to help with pain: ? Use a heating pad. ? Take a warm bath. ? Distract yourself, such as by watching TV. Eating and drinking  Drink enough fluid to keep your pee (urine) clear or pale yellow. Drink more in hot weather and during exercise.  Limit or avoid alcohol.  Eat a healthy diet. Eat plenty of fruits, vegetables, whole grains, and lean protein.  Take vitamins and supplements as told by your doctor. Traveling  When traveling, keep these with you: ? Your medical information. ? The names of your doctors. ? Your medicines.  If you need to take an airplane, talk to your doctor first. Activity  Rest often.  Avoid exercises that make your heart beat much faster, such as jogging. General instructions  Do not use products that have nicotine or tobacco, such as cigarettes and e-cigarettes. If you need help quitting, ask your doctor.  Consider wearing a medical alert bracelet.  Avoid being in high places (high altitudes), such as mountains.  Avoid very hot or cold temperatures.  Avoid places where the  temperature changes a lot.  Keep all follow-up visits as told by your doctor. This is important. Contact a doctor if:  A joint hurts.  Your feet or hands hurt or swell.  You feel tired (fatigued). Get help right away if:  You have symptoms of infection. These include: ? Fever. ? Chills. ? Being very tired. ? Irritability. ? Poor eating. ? Throwing up (vomiting).  You feel dizzy or faint.  You have new stomach pain, especially on the left side.  You have a an erection (priapism) that lasts more than 4 hours.  You have numbness in your arms or legs.  You have a hard time moving your arms or legs.  You have trouble talking.  You have pain that does not go away when you take medicine.  You are short of breath.  You are breathing fast.  You have a long-term cough.  You have pain in your chest.  You have a bad headache.  You have a stiff neck.  Your stomach looks bloated even though you did not eat much.  Your skin is pale.  You suddenly cannot see well. Summary  Sickle cell anemia is a condition where your red blood cells are shaped like sickles.  Follow your doctor's advice on ways to manage pain, food to eat, activities to do, and steps to take for safe travel.  Get medical help right away if you have any signs of infection, such as a fever. This information is not intended to replace advice given to you by   your health care provider. Make sure you discuss any questions you have with your health care provider. Document Revised: 10/22/2018 Document Reviewed: 08/05/2016 Elsevier Patient Education  2020 [employer]  

## 2020-06-04 ENCOUNTER — Telehealth: Payer: Self-pay

## 2020-06-04 NOTE — Telephone Encounter (Signed)
Transition Care Management Unsuccessful Follow-up Telephone Call ° °Date of discharge and from where:  06/01/2020 [facility]  ° °Attempts:  1st Attempt ° °Reason for unsuccessful TCM follow-up call:  Left voice message ° ° ° °

## 2020-06-05 NOTE — Telephone Encounter (Signed)
Transition Care Management Unsuccessful Follow-up Telephone Call  Date of discharge and from where:   06/01/2020 [facility]   Attempts:  2nd Attempt  Reason for unsuccessful TCM follow-up call:  Left voice message

## 2020-06-08 NOTE — Telephone Encounter (Signed)
Transition Care Management Unsuccessful Follow-up Telephone Call  Date of discharge and from where:  06/01/2020 [facility]  Attempts:  3rd Attempt  Reason for unsuccessful TCM follow-up call:  Left voice message

## 2020-07-08 ENCOUNTER — Encounter (HOSPITAL_COMMUNITY): Payer: Self-pay

## 2020-07-08 ENCOUNTER — Other Ambulatory Visit: Payer: Self-pay

## 2020-07-08 DIAGNOSIS — D72829 Elevated white blood cell count, unspecified: Secondary | ICD-10-CM | POA: Diagnosis present

## 2020-07-08 DIAGNOSIS — D638 Anemia in other chronic diseases classified elsewhere: Secondary | ICD-10-CM | POA: Diagnosis present

## 2020-07-08 DIAGNOSIS — Z832 Family history of diseases of the blood and blood-forming organs and certain disorders involving the immune mechanism: Secondary | ICD-10-CM

## 2020-07-08 DIAGNOSIS — R0902 Hypoxemia: Secondary | ICD-10-CM | POA: Diagnosis present

## 2020-07-08 DIAGNOSIS — D5700 Hb-SS disease with crisis, unspecified: Principal | ICD-10-CM | POA: Diagnosis present

## 2020-07-08 DIAGNOSIS — Z79899 Other long term (current) drug therapy: Secondary | ICD-10-CM

## 2020-07-08 DIAGNOSIS — G894 Chronic pain syndrome: Secondary | ICD-10-CM | POA: Diagnosis present

## 2020-07-08 DIAGNOSIS — Z833 Family history of diabetes mellitus: Secondary | ICD-10-CM

## 2020-07-08 DIAGNOSIS — Z20822 Contact with and (suspected) exposure to covid-19: Secondary | ICD-10-CM | POA: Diagnosis present

## 2020-07-08 LAB — CBC WITH DIFFERENTIAL/PLATELET
Abs Immature Granulocytes: 0.71 10*3/uL — ABNORMAL HIGH (ref 0.00–0.07)
Basophils Absolute: 0.2 10*3/uL — ABNORMAL HIGH (ref 0.0–0.1)
Basophils Relative: 1 %
Eosinophils Absolute: 0.2 10*3/uL (ref 0.0–0.5)
Eosinophils Relative: 1 %
HCT: 27.8 % — ABNORMAL LOW (ref 39.0–52.0)
Hemoglobin: 10.1 g/dL — ABNORMAL LOW (ref 13.0–17.0)
Immature Granulocytes: 4 %
Lymphocytes Relative: 27 %
Lymphs Abs: 5.1 10*3/uL — ABNORMAL HIGH (ref 0.7–4.0)
MCH: 32.6 pg (ref 26.0–34.0)
MCHC: 36.3 g/dL — ABNORMAL HIGH (ref 30.0–36.0)
MCV: 89.7 fL (ref 80.0–100.0)
Monocytes Absolute: 1.7 10*3/uL — ABNORMAL HIGH (ref 0.1–1.0)
Monocytes Relative: 9 %
Neutro Abs: 10.7 10*3/uL — ABNORMAL HIGH (ref 1.7–7.7)
Neutrophils Relative %: 58 %
Platelets: 406 10*3/uL — ABNORMAL HIGH (ref 150–400)
RBC: 3.1 MIL/uL — ABNORMAL LOW (ref 4.22–5.81)
RDW: 21.2 % — ABNORMAL HIGH (ref 11.5–15.5)
WBC: 18.6 10*3/uL — ABNORMAL HIGH (ref 4.0–10.5)
nRBC: 3.2 % — ABNORMAL HIGH (ref 0.0–0.2)

## 2020-07-08 LAB — COMPREHENSIVE METABOLIC PANEL
ALT: 12 U/L (ref 0–44)
AST: 30 U/L (ref 15–41)
Albumin: 4.1 g/dL (ref 3.5–5.0)
Alkaline Phosphatase: 107 U/L (ref 38–126)
Anion gap: 8 (ref 5–15)
BUN: 10 mg/dL (ref 6–20)
CO2: 26 mmol/L (ref 22–32)
Calcium: 9.2 mg/dL (ref 8.9–10.3)
Chloride: 105 mmol/L (ref 98–111)
Creatinine, Ser: 0.92 mg/dL (ref 0.61–1.24)
GFR, Estimated: >60 mL/min (ref >60)
Glucose, Bld: 142 mg/dL — ABNORMAL HIGH (ref 70–99)
Potassium: 3.7 mmol/L (ref 3.5–5.1)
Sodium: 139 mmol/L (ref 135–145)
Total Bilirubin: 3.9 mg/dL — ABNORMAL HIGH (ref 0.3–1.2)
Total Protein: 7.7 g/dL (ref 6.5–8.1)

## 2020-07-08 LAB — RETICULOCYTES
Immature Retic Fract: 32.3 % — ABNORMAL HIGH (ref 2.3–15.9)
RBC.: 3.04 MIL/uL — ABNORMAL LOW (ref 4.22–5.81)
Retic Count, Absolute: 527.5 10*3/uL — ABNORMAL HIGH (ref 19.0–186.0)
Retic Ct Pct: 17 % — ABNORMAL HIGH (ref 0.4–3.1)

## 2020-07-08 NOTE — ED Triage Notes (Signed)
Pt reports SSC pain going on all day with pain"everywhere". Takes 10mg  oxycodone for pain control. Last dose taken pta at 2145.

## 2020-07-09 ENCOUNTER — Inpatient Hospital Stay (HOSPITAL_COMMUNITY)
Admission: EM | Admit: 2020-07-09 | Discharge: 2020-07-13 | DRG: 812 | Disposition: A | Discharge status: Home / Self Care | Payer: Medicaid Other | Attending: Internal Medicine | Admitting: Internal Medicine

## 2020-07-09 ENCOUNTER — Emergency Department (HOSPITAL_COMMUNITY): Payer: Medicaid Other

## 2020-07-09 DIAGNOSIS — Z20822 Contact with and (suspected) exposure to covid-19: Secondary | ICD-10-CM | POA: Diagnosis not present

## 2020-07-09 DIAGNOSIS — D72829 Elevated white blood cell count, unspecified: Secondary | ICD-10-CM | POA: Diagnosis not present

## 2020-07-09 DIAGNOSIS — D638 Anemia in other chronic diseases classified elsewhere: Secondary | ICD-10-CM | POA: Diagnosis not present

## 2020-07-09 DIAGNOSIS — G894 Chronic pain syndrome: Secondary | ICD-10-CM | POA: Diagnosis not present

## 2020-07-09 DIAGNOSIS — D5700 Hb-SS disease with crisis, unspecified: Secondary | ICD-10-CM | POA: Diagnosis not present

## 2020-07-09 DIAGNOSIS — Z832 Family history of diseases of the blood and blood-forming organs and certain disorders involving the immune mechanism: Secondary | ICD-10-CM | POA: Diagnosis not present

## 2020-07-09 DIAGNOSIS — R0902 Hypoxemia: Secondary | ICD-10-CM | POA: Diagnosis not present

## 2020-07-09 DIAGNOSIS — Z833 Family history of diabetes mellitus: Secondary | ICD-10-CM | POA: Diagnosis not present

## 2020-07-09 DIAGNOSIS — Z79899 Other long term (current) drug therapy: Secondary | ICD-10-CM | POA: Diagnosis not present

## 2020-07-09 LAB — RESP PANEL BY RT-PCR (FLU A&B, COVID) ARPGX2
Influenza A by PCR: NEGATIVE
Influenza B by PCR: NEGATIVE
SARS Coronavirus 2 by RT PCR: NEGATIVE

## 2020-07-09 MED ORDER — SODIUM CHLORIDE 0.9% FLUSH: 9.0000 mL | INTRAVENOUS | Status: DC | PRN

## 2020-07-09 MED ORDER — SODIUM CHLORIDE 0.45 % IV SOLN
INTRAVENOUS | Status: DC
Start: 2020-07-09 — End: 2020-07-12

## 2020-07-09 MED ORDER — ENOXAPARIN SODIUM 40 MG/0.4ML ~~LOC~~ SOLN
40.0000 mg | SUBCUTANEOUS | Status: DC
  Filled 2020-07-09: qty 0.4

## 2020-07-09 MED ORDER — KETOROLAC TROMETHAMINE 15 MG/ML IJ SOLN
15.0000 mg | INTRAMUSCULAR | Status: AC
  Administered 2020-07-09: 01:00:00 15 mg via INTRAVENOUS
  Filled 2020-07-09: qty 1

## 2020-07-09 MED ORDER — KETOROLAC TROMETHAMINE 15 MG/ML IJ SOLN
15.0000 mg | Freq: Four times a day (QID) | INTRAMUSCULAR | Status: DC
  Administered 2020-07-09 – 2020-07-13 (×16): 15 mg via INTRAVENOUS
  Filled 2020-07-09 (×17): qty 1

## 2020-07-09 MED ORDER — OXYCODONE-ACETAMINOPHEN 5-325 MG PO TABS
1.0000 | ORAL_TABLET | Freq: Once | ORAL | Status: AC
  Administered 2020-07-09: 03:00:00 1 via ORAL
  Filled 2020-07-09: qty 1

## 2020-07-09 MED ORDER — MORPHINE SULFATE (PF) 4 MG/ML IV SOLN
8.0000 mg | INTRAVENOUS | Status: AC
  Administered 2020-07-09: 02:00:00 8 mg via INTRAVENOUS
  Filled 2020-07-09: qty 2

## 2020-07-09 MED ORDER — SODIUM CHLORIDE 0.9 % IV SOLN: Freq: Once | INTRAVENOUS | Status: AC

## 2020-07-09 MED ORDER — OXYCODONE HCL 5 MG PO TABS
10.0000 mg | ORAL_TABLET | ORAL | Status: DC | PRN
  Administered 2020-07-11 – 2020-07-12 (×4): 10 mg via ORAL
  Filled 2020-07-09 (×4): qty 2

## 2020-07-09 MED ORDER — ONDANSETRON HCL 4 MG/2ML IJ SOLN
4.0000 mg | INTRAMUSCULAR | Status: DC | PRN
  Administered 2020-07-09: 01:00:00 4 mg via INTRAVENOUS
  Filled 2020-07-09: qty 2

## 2020-07-09 MED ORDER — DIPHENHYDRAMINE HCL 25 MG PO CAPS
25.0000 mg | ORAL_CAPSULE | ORAL | Status: DC | PRN
  Administered 2020-07-11: 16:00:00 25 mg via ORAL
  Filled 2020-07-09: qty 1

## 2020-07-09 MED ORDER — SODIUM CHLORIDE 0.9 % IV BOLUS
1000.0000 mL | Freq: Once | INTRAVENOUS | Status: AC
  Administered 2020-07-09: 08:00:00 1000 mL via INTRAVENOUS

## 2020-07-09 MED ORDER — ACETAMINOPHEN 500 MG PO TABS
1000.0000 mg | ORAL_TABLET | Freq: Four times a day (QID) | ORAL | Status: DC | PRN
  Administered 2020-07-10: 1000 mg via ORAL
  Filled 2020-07-09: qty 2

## 2020-07-09 MED ORDER — ONDANSETRON HCL 4 MG/2ML IJ SOLN
4.0000 mg | Freq: Four times a day (QID) | INTRAMUSCULAR | Status: DC | PRN
  Administered 2020-07-10: 02:00:00 4 mg via INTRAVENOUS
  Filled 2020-07-09: qty 2

## 2020-07-09 MED ORDER — SODIUM CHLORIDE 0.9 % IV SOLN
25.0000 mg | INTRAVENOUS | Status: DC | PRN
  Filled 2020-07-09: qty 0.5

## 2020-07-09 MED ORDER — FOLIC ACID 1 MG PO TABS
1.0000 mg | ORAL_TABLET | Freq: Every day | ORAL | Status: DC
  Administered 2020-07-09 – 2020-07-12 (×4): 1 mg via ORAL
  Filled 2020-07-09 (×4): qty 1

## 2020-07-09 MED ORDER — HYDROXYUREA 500 MG PO CAPS
1500.0000 mg | ORAL_CAPSULE | Freq: Every day | ORAL | Status: DC
  Administered 2020-07-09 – 2020-07-12 (×4): 1500 mg via ORAL
  Filled 2020-07-09 (×4): qty 3

## 2020-07-09 MED ORDER — SENNOSIDES-DOCUSATE SODIUM 8.6-50 MG PO TABS
1.0000 | ORAL_TABLET | Freq: Two times a day (BID) | ORAL | Status: DC
  Administered 2020-07-09 – 2020-07-13 (×8): 1 via ORAL
  Filled 2020-07-09 (×8): qty 1

## 2020-07-09 MED ORDER — HYDROMORPHONE 1 MG/ML IV SOLN
INTRAVENOUS | Status: DC
  Administered 2020-07-09: 2 mg via INTRAVENOUS
  Administered 2020-07-09: 30 mg via INTRAVENOUS
  Administered 2020-07-09: 3 mg via INTRAVENOUS
  Administered 2020-07-09: 2.5 mg via INTRAVENOUS
  Administered 2020-07-10: 1.5 mg via INTRAVENOUS
  Administered 2020-07-10: 2 mg via INTRAVENOUS
  Administered 2020-07-10: 0.5 mg via INTRAVENOUS
  Administered 2020-07-10: 2.5 mg via INTRAVENOUS
  Administered 2020-07-10: 2 mg via INTRAVENOUS
  Administered 2020-07-10: 3 mg via INTRAVENOUS
  Administered 2020-07-10: 1 mg via INTRAVENOUS
  Administered 2020-07-11: 0 mg via INTRAVENOUS
  Administered 2020-07-11: 1.5 mg via INTRAVENOUS
  Administered 2020-07-11: 0 mg via INTRAVENOUS
  Administered 2020-07-11: 30 mg via INTRAVENOUS
  Filled 2020-07-09 (×2): qty 30

## 2020-07-09 MED ORDER — POLYETHYLENE GLYCOL 3350 17 G PO PACK
17.0000 g | PACK | Freq: Every day | ORAL | Status: DC | PRN
  Administered 2020-07-11 – 2020-07-13 (×3): 17 g via ORAL
  Filled 2020-07-09 (×3): qty 1

## 2020-07-09 MED ORDER — MORPHINE SULFATE (PF) 4 MG/ML IV SOLN
10.0000 mg | INTRAVENOUS | Status: AC
  Administered 2020-07-09: 02:00:00 10 mg via INTRAVENOUS
  Filled 2020-07-09: qty 3

## 2020-07-09 MED ORDER — NALOXONE HCL 0.4 MG/ML IJ SOLN: 0.4000 mg | INTRAMUSCULAR | Status: DC | PRN

## 2020-07-09 NOTE — H&P (Signed)
H&P  Patient Demographics:  Matthew Frederick, is a 19 y.o. male  MRN: [id number]   DOB - 06-24-2001  Admit Date - 07/09/2020  Outpatient Primary MD for the patient is Matthew Rakers, MD  Chief Complaint  Patient presents with  . Sickle Cell Pain Crisis     HPI:   Matthew Frederick  is a 19 y.o. male with medical history significant for sickle cell disease, chronic pain syndrome and anemia of chronic disease who presented to the emergency room with major complaints of generalized body pain that is consistent with his typical sickle cell pain crisis.  He claims he was in his usual state of health until about 4 hours prior to presentation when his pain escalated almost from 0 -10/10, he took his home pain medications of oxycodone 20 mg tablet with no sustained relief. As at the time of this admission, pain is 7/10 characterized as throbbing and achy, generalized but mostly in his lower extremities. He denies any trauma or fall. He denies any nausea or vomiting. No fever, no chest pain, no shortness of breath. Patient denies any sick contact, recent travel or contact with COVID-19 patient. He denies any headache, blurry vision, joint swelling or redness.  ED course Updated Vital Signs were: BP 101/60 (BP Location: Right Arm)   Pulse 86   Temp 98 F (36.7 C) (Oral)   Resp 18   SpO2 99%.  He was found to be in no acute distress, normal appearance.  Unremarkable physical examination.  Respiratory panel was negative.  CBC showed elevated white cell count to 18.6, hemoglobin of 10.1, platelet of 406.  Comprehensive metabolic panel was unremarkable.  Patient received multiple doses of morphine and Toradol but pain persist.  Patient has been admitted for further evaluation and sickle cell pain management.  Review of systems:  In addition to the HPI above, patient reports No fever or chills No Headache, No changes with vision or hearing No problems swallowing food or liquids No chest pain, cough or  shortness of breath No abdominal pain, No nausea or vomiting, Bowel movements are regular No blood in stool or urine No dysuria No new skin rashes or bruises No new joints pains-aches No new weakness, tingling, numbness in any extremity No recent weight gain or loss No polyuria, polydypsia or polyphagia No significant Mental Stressors  A full 10 point Review of Systems was done, except as stated above, all other Review of Systems were negative.  With Past History of the following :   Past Medical History:  Diagnosis Date  . Sickle cell anemia (HCC)       History reviewed. No pertinent surgical history.   Social History:   Social History   Tobacco Use  . Smoking status: Never Smoker  . Smokeless tobacco: Never Used  Substance Use Topics  . Alcohol use: No     Lives - At home   Family History :   Family History  Problem Relation Age of Onset  . Sickle cell anemia Father   . Diabetes Maternal Grandmother      Home Medications:   Prior to Admission medications   Medication Sig Start Date End Date Taking? Authorizing Provider  acetaminophen (TYLENOL) 500 MG tablet Take 2 tablets (1,000 mg total) by mouth every 6 (six) hours as needed for headache. 06/01/20  Yes Massie Maroon, FNP  folic acid (FOLVITE) 1 MG tablet Take 1 mg by mouth at bedtime.    Yes [provider]  HYDROcodone-acetaminophen (  NORCO/VICODIN) 5-325 MG tablet Take 1 tablet by mouth every 6 (six) hours as needed for severe pain. 05/26/20  Yes Patel, Shalyn, PA-C  hydroxyurea (HYDREA) 500 MG capsule Take 1,500 mg by mouth at bedtime.    Yes [provider]  ibuprofen (ADVIL) 800 MG tablet Take 1 tablet (800 mg total) by mouth every 8 (eight) hours as needed. 06/01/20  Yes Massie Maroon, FNP  Oxycodone HCl 10 MG TABS Take 1 tablet (10 mg total) by mouth every 4 (four) hours as needed. 06/01/20  Yes Massie Maroon, FNP     Allergies:   No Known Allergies   Physical Exam:    Vitals:   Vitals:   07/09/20 0745 07/09/20 0800  BP: (!) 117/53 (!) 102/57  Pulse: 72 77  Resp:  16  Temp:    SpO2: 97% 98%    Physical Exam: Constitutional: Patient appears well-developed and well-nourished. Not in obvious distress.  Thin built HENT: Normocephalic, atraumatic, External right and left ear normal. Oropharynx is clear and moist.  Eyes: Conjunctivae and EOM are normal. PERRLA, no scleral icterus. Neck: Normal ROM. Neck supple. No JVD. No tracheal deviation. No thyromegaly. CVS: RRR, S1/S2 +, no murmurs, no gallops, no carotid bruit.  Pulmonary: Effort and breath sounds normal, no stridor, rhonchi, wheezes, rales.  Abdominal: Soft. BS +, no distension, tenderness, rebound or guarding.  Musculoskeletal: Normal range of motion. No edema and no tenderness.  Lymphadenopathy: No lymphadenopathy noted, cervical, inguinal or axillary Neuro: Alert. Normal reflexes, muscle tone coordination. No cranial nerve deficit. Skin: Skin is warm and dry. No rash noted. Not diaphoretic. No erythema. No pallor. Psychiatric: Normal mood and affect. Behavior, judgment, thought content normal.   Data Review:   CBC Recent Labs  Lab 07/08/20 2219  WBC 18.6*  HGB 10.1*  HCT 27.8*  PLT 406*  MCV 89.7  MCH 32.6  MCHC 36.3*  RDW 21.2*  LYMPHSABS 5.1*  MONOABS 1.7*  EOSABS 0.2  BASOSABS 0.2*   ------------------------------------------------------------------------------------------------------------------  Chemistries  Recent Labs  Lab 07/08/20 2219  NA 139  K 3.7  CL 105  CO2 26  GLUCOSE 142*  BUN 10  CREATININE 0.92  CALCIUM 9.2  AST 30  ALT 12  ALKPHOS 107  BILITOT 3.9*   ------------------------------------------------------------------------------------------------------------------ estimated creatinine clearance is 113.4 mL/min (by C-G formula based on SCr of 0.92  mg/dL). ------------------------------------------------------------------------------------------------------------------ No results for input(s): TSH, T4TOTAL, T3FREE, THYROIDAB in the last 72 hours.  Invalid input(s): FREET3  Coagulation profile No results for input(s): INR, PROTIME in the last 168 hours. ------------------------------------------------------------------------------------------------------------------- No results for input(s): DDIMER in the last 72 hours. -------------------------------------------------------------------------------------------------------------------  Cardiac Enzymes No results for input(s): CKMB, TROPONINI, MYOGLOBIN in the last 168 hours.  Invalid input(s): CK ------------------------------------------------------------------------------------------------------------------ No results found for: BNP  ---------------------------------------------------------------------------------------------------------------  Urinalysis    Component Value Date/Time   COLORURINE YELLOW 09/08/2019 2226   APPEARANCEUR CLEAR 09/08/2019 2226   LABSPEC 1.014 09/08/2019 2226   PHURINE 7.0 09/08/2019 2226   GLUCOSEU NEGATIVE 09/08/2019 2226   HGBUR NEGATIVE 09/08/2019 2226   BILIRUBINUR NEGATIVE 09/08/2019 2226   KETONESUR NEGATIVE 09/08/2019 2226   PROTEINUR NEGATIVE 09/08/2019 2226   NITRITE NEGATIVE 09/08/2019 2226   LEUKOCYTESUR NEGATIVE 09/08/2019 2226    ----------------------------------------------------------------------------------------------------------------   Imaging Results:    DG Chest Portable 1 View  Result Date: 07/09/2020 CLINICAL DATA:  19 year old male with sickle cell disease, pain crisis, hypoxia. EXAM: PORTABLE CHEST 1 VIEW COMPARISON:  Chest radiographs 02/18/2020 and earlier. FINDINGS: Portable AP  semi upright view at 0512 hours. Normal lung volumes and mediastinal contours. Visualized tracheal air column is within normal limits.  Allowing for portable technique the lungs are clear. No pneumothorax or pleural effusion. Widespread H-shaped vertebral deformities in keeping with sequelae of sickle cell. No acute osseous abnormality identified. Negative visible bowel gas pattern. IMPRESSION: 1. No acute cardiopulmonary abnormality. 2. 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 Electronically Signed   By: Odessa Fleming M.D.   On: 07/09/2020 05:27     Assessment & Plan:  Principal Problem:   Sickle cell pain crisis (HCC) Active Problems:   Vasoocclusive sickle cell crisis (HCC)   Leukocytosis   Chronic pain syndrome  1. Hb Sickle Cell Disease with crisis: Admit patient, start IVF 0.45% Saline @ 125 mls/hour, start weight based Dilaudid PCA at 0.5/10/3, start IV Toradol 15 mg Q 6 H for total of 5 days, Restart and continue oral home pain medications, Monitor vitals very closely, Re-evaluate pain scale regularly, 2 L of Oxygen by Iselin, Patient will be re-evaluated for pain in the context of function and relationship to baseline as care progresses. 2. Leukocytosis: Most likely reactive, there is no evidence of inflammation or infection at this time, patient is afebrile. We will monitor closely and managed without antibiotics. Blood cultures obtained, will reevaluate with results. 3. Sickle Cell Anemia: Hemoglobin is stable at baseline today, there is no clinical indication for blood transfusion.  We will monitor closely and repeat labs in a.m. 4. Chronic pain Syndrome: Restart and continue home pain medications.  If pain control is sustained, patient may be able to go home tomorrow.  DVT Prophylaxis: Subcut Lovenox   AM Labs Ordered, also please review Full Orders  Family Communication: Admission, patient's condition and plan of care including tests being ordered have been discussed with the patient who indicate understanding and agree with the plan and Code Status.  Code Status: Full Code  Consults called: None    Admission  status: Inpatient    Time spent in minutes : 50 minutes  Jeanann Lewandowsky MD, MHA, CPE, FACP 07/09/2020 at 8:42 AM

## 2020-07-09 NOTE — ED Provider Notes (Signed)
Received patient as a handoff at shift change from [facility], [place] at shift change.  In short, patient is a 19 year old male with PMH of sickle cell anemia.  He presented to the ED last evening for pain crisis.  Denied any chest pain or shortness of breath symptoms.  Reports that his pain is typically best controlled with morphine.  He was provided multiple doses of IV morphine in addition to Toradol.  He was also given a Percocet and offered a fourth and final dose, but patient is requesting admission.  Overnight hospitalist team was consulted, but requested chest x-ray and blood cultures prior to admission given multiple episodes of borderline hypoxia in low 90s.  At time of handoff, plan is for admission to morning sickle cell day physician at 8 AM.  Physical Exam  BP (!) 102/57   Pulse 77   Temp 98 F (36.7 C) (Oral)   Resp 16   Ht 5\' 9"  (1.753 m)   Wt 62.1 kg   SpO2 98%   BMI 20.23 kg/m   Physical Exam Vitals and nursing note reviewed. Exam conducted with a chaperone present.  Constitutional:      Appearance: Normal appearance.  HENT:     Head: Normocephalic and atraumatic.  Eyes:     General: No scleral icterus.    Conjunctiva/sclera: Conjunctivae normal.  Cardiovascular:     Rate and Rhythm: Normal rate.  Pulmonary:     Effort: Pulmonary effort is normal. No respiratory distress.  Skin:    General: Skin is dry.  Neurological:     Mental Status: He is alert and oriented to person, place, and time.     GCS: GCS eye subscore is 4. GCS verbal subscore is 5. GCS motor subscore is 6.  Psychiatric:        Mood and Affect: Mood normal.        Behavior: Behavior normal.        Thought Content: Thought content normal.     ED Course/Procedures   Clinical Course as of 07/09/20 07/11/20  [facility] Jul 09, 2020  0537 DG Chest Portable 1 View IMPRESSION: 1. No acute cardiopulmonary abnormality. 2. 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 [LJ]    Clinical Course User  Index [LJ] Jul 11, 2020, PA-C    Procedures Results for orders placed or performed during the hospital encounter of 07/09/20  Resp Panel by RT-PCR (Flu A&B, Covid) Nasopharyngeal Swab   Specimen: Nasopharyngeal Swab; Nasopharyngeal(NP) swabs in vial transport medium  Result Value Ref Range   SARS Coronavirus 2 by RT PCR NEGATIVE NEGATIVE   Influenza A by PCR NEGATIVE NEGATIVE   Influenza B by PCR NEGATIVE NEGATIVE  Comprehensive metabolic panel  Result Value Ref Range   Sodium 139 135 - 145 mmol/L   Potassium 3.7 3.5 - 5.1 mmol/L   Chloride 105 98 - 111 mmol/L   CO2 26 22 - 32 mmol/L   Glucose, Bld 142 (H) 70 - 99 mg/dL   BUN 10 6 - 20 mg/dL   Creatinine, Ser 07/11/20 0.61 - 1.24 mg/dL   Calcium 9.2 8.9 - 7.82 mg/dL   Total Protein 7.7 6.5 - 8.1 g/dL   Albumin 4.1 3.5 - 5.0 g/dL   AST 30 15 - 41 U/L   ALT 12 0 - 44 U/L   Alkaline Phosphatase 107 38 - 126 U/L   Total Bilirubin 3.9 (H) 0.3 - 1.2 mg/dL   GFR, Estimated 95.6 >21 mL/min   Anion gap  8 5 - 15  CBC with Differential  Result Value Ref Range   WBC 18.6 (H) 4.0 - 10.5 K/uL   RBC 3.10 (L) 4.22 - 5.81 MIL/uL   Hemoglobin 10.1 (L) 13.0 - 17.0 g/dL   HCT 47.8 (L) 29.5 - 62.1 %   MCV 89.7 80.0 - 100.0 fL   MCH 32.6 26.0 - 34.0 pg   MCHC 36.3 (H) 30.0 - 36.0 g/dL   RDW 30.8 (H) 65.7 - 84.6 %   Platelets 406 (H) 150 - 400 K/uL   nRBC 3.2 (H) 0.0 - 0.2 %   Neutrophils Relative % 58 %   Neutro Abs 10.7 (H) 1.7 - 7.7 K/uL   Lymphocytes Relative 27 %   Lymphs Abs 5.1 (H) 0.7 - 4.0 K/uL   Monocytes Relative 9 %   Monocytes Absolute 1.7 (H) 0.1 - 1.0 K/uL   Eosinophils Relative 1 %   Eosinophils Absolute 0.2 0.0 - 0.5 K/uL   Basophils Relative 1 %   Basophils Absolute 0.2 (H) 0.0 - 0.1 K/uL   Immature Granulocytes 4 %   Abs Immature Granulocytes 0.71 (H) 0.00 - 0.07 K/uL   Carollee Massed Bodies PRESENT    Polychromasia PRESENT    Sickle Cells MARKED    Target Cells PRESENT   Reticulocytes  Result Value Ref Range    Retic Ct Pct 17.0 (H) 0.4 - 3.1 %   RBC. 3.04 (L) 4.22 - 5.81 MIL/uL   Retic Count, Absolute 527.5 (H) 19.0 - 186.0 K/uL   Immature Retic Fract 32.3 (H) 2.3 - 15.9 %   DG Chest Portable 1 View  Result Date: 07/09/2020 CLINICAL DATA:  19 year old male with sickle cell disease, pain crisis, hypoxia. EXAM: PORTABLE CHEST 1 VIEW COMPARISON:  Chest radiographs 02/18/2020 and earlier. FINDINGS: Portable AP semi upright view at 0512 hours. Normal lung volumes and mediastinal contours. Visualized tracheal air column is within normal limits. Allowing for portable technique the lungs are clear. No pneumothorax or pleural effusion. Widespread H-shaped vertebral deformities in keeping with sequelae of sickle cell. No acute osseous abnormality identified. Negative visible bowel gas pattern. IMPRESSION: 1. No acute cardiopulmonary abnormality. 2. 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 Electronically Signed   By: Odessa Fleming M.D.   On: 07/09/2020 05:27     MDM   On my examination, patient was complaining of back pain, but in no acute distress.  He has already received all of his pain doses here in the ED.  His BP was soft at 90/60 and he was once again borderline O2 90 to 92%, but there was not particularly good waveform.  We will give him 1 L IV NS and maintenance fluids.  He has urine collected at bedside which appears concentrated.  He is followed by Dr. Sherryll Burger with [facility] and states that his pain at home is uncontrolled with his maintenance medications.  He is on hydroxyurea.  Last crisis was around Thanksgiving.    I spoke with Raliegh Ip spoke with Dr. Hyman Hopes who will admit patient for pain control in setting of his sickle cell crisis.       Lorelee New, PA-C 07/09/20 9629    Tilden Fossa, MD 07/09/20 [phone]

## 2020-07-09 NOTE — ED Provider Notes (Addendum)
[facility]-EMERGENCY DEPT Provider Note   CSN: [id number] Arrival date & time: 07/08/20  2206     History Chief Complaint  Patient presents with  . Sickle Cell Pain Crisis    Matthew Frederick is a 19 y.o. male.  HPI Patient is a 19 year old male with a history of sickle cell anemia.  Reports to the emergency department today due to worsening pain across his whole body.  States that his pain began to worsen about 4 hours ago.  Took 20 mg of oxycodone with minimal relief and then came to the emergency department.  Denies any shortness of breath or chest pain.  Reports an episode of nausea and vomiting prior to arrival when his pain began to worsen.     Past Medical History:  Diagnosis Date  . Sickle cell anemia [facility])     Patient Active Problem List   Diagnosis Date Noted  . Acute intractable headache 05/28/2020  . Chronic pain syndrome 05/28/2020  . Sickle cell anemia with crisis (HCC) 01/21/2020  . Anemia   . Sickle-cell crisis (HCC) 09/03/2019  . Sickle cell anemia with pain (HCC) 08/05/2019  . Abnormal liver function 04/02/2019  . Thrombocytopenia (HCC) 11/24/2018  . Leukocytosis 11/22/2018  . Chest pain   . Coccyx pain   . Vasoocclusive sickle cell crisis (HCC)   . Fever   . Sickle cell crisis (HCC) 07/23/2016  . Transition of care performed with sharing of clinical summary 04/28/2016  . Need for immunization against influenza 04/22/2012    History reviewed. No pertinent surgical history.     Family History  Problem Relation Age of Onset  . Sickle cell anemia Father   . Diabetes Maternal Grandmother     Social History   Tobacco Use  . Smoking status: Never Smoker  . Smokeless tobacco: Never Used  Vaping Use  . Vaping Use: Never used  Substance Use Topics  . Alcohol use: No  . Drug use: No    Home Medications Prior to Admission medications   Medication Sig Start Date End Date Taking? Authorizing Provider  acetaminophen  (TYLENOL) 500 MG tablet Take 2 tablets (1,000 mg total) by mouth every 6 (six) hours as needed for headache. 06/01/20   Massie Maroon, FNP  folic acid (FOLVITE) 1 MG tablet Take 1 mg by mouth at bedtime.     [provider]  HYDROcodone-acetaminophen (NORCO/VICODIN) 5-325 MG tablet Take 1 tablet by mouth every 6 (six) hours as needed for severe pain. 05/26/20   Farrel Gordon, PA-C  hydroxyurea (HYDREA) 500 MG capsule Take 1,500 mg by mouth at bedtime.     [provider]  ibuprofen (ADVIL) 800 MG tablet Take 1 tablet (800 mg total) by mouth every 8 (eight) hours as needed. 06/01/20   Massie Maroon, FNP  Oxycodone HCl 10 MG TABS Take 1 tablet (10 mg total) by mouth every 4 (four) hours as needed. 06/01/20   Massie Maroon, FNP    Allergies    Patient has no known allergies.  Review of Systems   Review of Systems  All other systems reviewed and are negative. Ten systems reviewed and are negative for acute change, except as noted in the HPI.   Physical Exam Updated Vital Signs BP 101/60 (BP Location: Right Arm)   Pulse 86   Temp 98 F (36.7 C) (Oral)   Resp 18   SpO2 99%   Physical Exam Vitals and nursing note reviewed.  Constitutional:  General: He is in acute distress.     Appearance: Normal appearance. He is normal weight. He is not ill-appearing, toxic-appearing or diaphoretic.     Comments: Well-developed adult male.  Cachectic.  Writhing around on the stretcher.  HENT:     Head: Normocephalic and atraumatic.     Right Ear: External ear normal.     Left Ear: External ear normal.     Nose: Nose normal.     Mouth/Throat:     Mouth: Mucous membranes are moist.     Pharynx: Oropharynx is clear. No oropharyngeal exudate or posterior oropharyngeal erythema.  Eyes:     Extraocular Movements: Extraocular movements intact.  Cardiovascular:     Rate and Rhythm: Normal rate and regular rhythm.     Pulses: Normal pulses.     Heart sounds: Normal heart  sounds. No murmur heard. No friction rub. No gallop.   Pulmonary:     Effort: Pulmonary effort is normal. No respiratory distress.     Breath sounds: Normal breath sounds. No stridor. No wheezing, rhonchi or rales.  Abdominal:     General: Abdomen is flat.     Palpations: Abdomen is soft.     Tenderness: There is no abdominal tenderness.  Musculoskeletal:        General: Normal range of motion.     Cervical back: Normal range of motion and neck supple. No tenderness.     Right lower leg: No edema.     Left lower leg: No edema.  Skin:    General: Skin is warm and dry.  Neurological:     General: No focal deficit present.     Mental Status: He is alert and oriented to person, place, and time.  Psychiatric:        Mood and Affect: Mood normal.        Behavior: Behavior normal.    ED Results / Procedures / Treatments   Labs (all labs ordered are listed, but only abnormal results are displayed) Labs Reviewed  COMPREHENSIVE METABOLIC PANEL - Abnormal; Notable for the following components:      Result Value   Glucose, Bld 142 (*)    Total Bilirubin 3.9 (*)    All other components within normal limits  CBC WITH DIFFERENTIAL/PLATELET - Abnormal; Notable for the following components:   WBC 18.6 (*)    RBC 3.10 (*)    Hemoglobin 10.1 (*)    HCT 27.8 (*)    MCHC 36.3 (*)    RDW 21.2 (*)    Platelets 406 (*)    nRBC 3.2 (*)    Neutro Abs 10.7 (*)    Lymphs Abs 5.1 (*)    Monocytes Absolute 1.7 (*)    Basophils Absolute 0.2 (*)    Abs Immature Granulocytes 0.71 (*)    All other components within normal limits  RETICULOCYTES - Abnormal; Notable for the following components:   Retic Ct Pct 17.0 (*)    RBC. 3.04 (*)    Retic Count, Absolute 527.5 (*)    Immature Retic Fract 32.3 (*)    All other components within normal limits  RESP PANEL BY RT-PCR (FLU A&B, COVID) ARPGX2  CULTURE, BLOOD (ROUTINE X 2)  CULTURE, BLOOD (ROUTINE X 2)   EKG None  Radiology No results  found.  Procedures Procedures (including critical care time)  Medications Ordered in ED Medications  ondansetron (ZOFRAN) injection 4 mg (4 mg Intravenous Given 07/09/20 0115)  morphine 4 MG/ML injection 8 mg (  8 mg Intravenous Given 07/09/20 0133)  morphine 4 MG/ML injection 10 mg (10 mg Intravenous Given 07/09/20 0204)  ketorolac (TORADOL) 15 MG/ML injection 15 mg (15 mg Intravenous Given 07/09/20 0113)  oxyCODONE-acetaminophen (PERCOCET/ROXICET) 5-325 MG per tablet 1 tablet (1 tablet Oral Given 07/09/20 0319)    ED Course  I have reviewed the triage vital signs and the nursing notes.  Pertinent labs & imaging results that were available during my care of the patient were reviewed by me and considered in my medical decision making (see chart for details).  Clinical Course as of 07/09/20 7824  [facility] Jul 09, 2020  0537 DG Chest Portable 1 View IMPRESSION: 1. No acute cardiopulmonary abnormality. 2. 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 [LJ]    Clinical Course User Index [LJ] Placido Sou, PA-C   MDM Rules/Calculators/A&P                          Patient is a 19 year old male who presents the emergency department due to sickle cell pain.  Patient states his pain is diffuse in nature.  States it is similar to prior sickle cell pain exacerbations.    Patient noted that his pain is typically better managed with morphine instead of Dilaudid.  He was given 2 doses of IV morphine as well as a dose of Toradol.  Notes moderate relief.  States his pain is 5/10.  Additionally given a Percocet.  Patient reassessed and is feeling moderately improved.  He was given an additional dose of Percocet.  Again reassessed and states that he has now having worsening pain once again.  Does not feel comfortable being discharged at this time.  Discussed admission and he is amenable.  Will obtain a respiratory panel and will admit.   Patient discussed with the medicine team.  Given current  patient load requested that patient remain in the emergency department until morning team can admit.  Patient had couple of episodes of hypoxia in the low 90s.  Denies any chest pain or shortness of breath to me.  This was after receiving IV morphine as well as Percocet.  Medicine team requested I obtain chest x-ray as well as blood cultures prior to reconsulting for admission.  It is the end of my shift and patient care will be transferred to [facility].  Chest x-ray reassuring.  Blood cultures obtained.  Patient awaiting admission to morning sickle cell day physician.  Final Clinical Impression(s) / ED Diagnoses Final diagnoses:  Sickle cell pain crisis [facility])   Rx / DC Orders ED Discharge Orders    None       Placido Sou, PA-C 07/09/20 0405    Placido Sou, PA-C 07/09/20 0636    Marily Memos, MD 07/10/20 [phone]

## 2020-07-09 NOTE — ED Notes (Signed)
Patient placed on 2L [place] due to O2 dropping while receiving morphine. Will monitor and wean off as soon as possible.

## 2020-07-10 ENCOUNTER — Inpatient Hospital Stay (HOSPITAL_COMMUNITY): Payer: Medicaid Other

## 2020-07-10 DIAGNOSIS — G894 Chronic pain syndrome: Secondary | ICD-10-CM | POA: Diagnosis not present

## 2020-07-10 DIAGNOSIS — D57219 Sickle-cell/Hb-C disease with crisis, unspecified: Secondary | ICD-10-CM | POA: Diagnosis not present

## 2020-07-10 DIAGNOSIS — D571 Sickle-cell disease without crisis: Secondary | ICD-10-CM | POA: Diagnosis not present

## 2020-07-10 DIAGNOSIS — D72829 Elevated white blood cell count, unspecified: Secondary | ICD-10-CM | POA: Diagnosis not present

## 2020-07-10 DIAGNOSIS — D5700 Hb-SS disease with crisis, unspecified: Secondary | ICD-10-CM | POA: Diagnosis not present

## 2020-07-10 LAB — BASIC METABOLIC PANEL
Anion gap: 10 (ref 5–15)
BUN: 12 mg/dL (ref 6–20)
CO2: 25 mmol/L (ref 22–32)
Calcium: 8.5 mg/dL — ABNORMAL LOW (ref 8.9–10.3)
Chloride: 101 mmol/L (ref 98–111)
Creatinine, Ser: 0.77 mg/dL (ref 0.61–1.24)
GFR, Estimated: >60 mL/min (ref >60)
Glucose, Bld: 87 mg/dL (ref 70–99)
Potassium: 3.8 mmol/L (ref 3.5–5.1)
Sodium: 136 mmol/L (ref 135–145)

## 2020-07-10 LAB — CBC WITH DIFFERENTIAL/PLATELET
Abs Immature Granulocytes: 0.13 10*3/uL — ABNORMAL HIGH (ref 0.00–0.07)
Basophils Absolute: 0.1 10*3/uL (ref 0.0–0.1)
Basophils Relative: 0 %
Eosinophils Absolute: 0.1 10*3/uL (ref 0.0–0.5)
Eosinophils Relative: 0 %
HCT: 25.9 % — ABNORMAL LOW (ref 39.0–52.0)
Hemoglobin: 9.3 g/dL — ABNORMAL LOW (ref 13.0–17.0)
Immature Granulocytes: 1 %
Lymphocytes Relative: 14 %
Lymphs Abs: 2.3 10*3/uL (ref 0.7–4.0)
MCH: 32.5 pg (ref 26.0–34.0)
MCHC: 35.9 g/dL (ref 30.0–36.0)
MCV: 90.6 fL (ref 80.0–100.0)
Monocytes Absolute: 2.5 10*3/uL — ABNORMAL HIGH (ref 0.1–1.0)
Monocytes Relative: 16 %
Neutro Abs: 11.1 10*3/uL — ABNORMAL HIGH (ref 1.7–7.7)
Neutrophils Relative %: 69 %
Platelets: 450 10*3/uL — ABNORMAL HIGH (ref 150–400)
RBC: 2.86 MIL/uL — ABNORMAL LOW (ref 4.22–5.81)
RDW: 20.6 % — ABNORMAL HIGH (ref 11.5–15.5)
WBC: 16.1 10*3/uL — ABNORMAL HIGH (ref 4.0–10.5)
nRBC: 5.6 % — ABNORMAL HIGH (ref 0.0–0.2)

## 2020-07-10 NOTE — Progress Notes (Signed)
Subjective: Matthew Frederick is a 19 year old male with a medical history significant for sickle cell disease, chronic pain syndrome, and anemia of chronic disease that was admitted for sickle cell pain crisis. Patient says that pain has improved some overnight.  He rates pain as 7/10 primarily to low back and lower extremities.  He denies any chest pain, shortness of breath, urinary symptoms, nausea, vomiting, or diarrhea.  Objective:  Vital signs in last 24 hours:  Vitals:   07/10/20 0132 07/10/20 0419 07/10/20 0518 07/10/20 0831  BP: 128/76  133/74   Pulse: 100  98   Resp: 14 16 16 16   Temp: 99.2 F (37.3 C)  98.4 F (36.9 C)   TempSrc: Oral  Oral   SpO2: 98% 98% 97% 96%  Weight:   58.3 kg   Height:        Intake/Output from previous day:   Intake/Output Summary (Last 24 hours) at 07/10/2020 0859 Last data filed at 07/10/2020 0444 Gross per 24 hour  Intake 2279.49 ml  Output --  Net 2279.49 ml    Physical Exam: General: Alert, awake, oriented x3, in no acute distress.  HEENT: [place]/AT PEERL, EOMI Neck: Trachea midline,  no masses, no thyromegal,y no JVD, no carotid bruit OROPHARYNX:  Moist, No exudate/ erythema/lesions.  Heart: Regular rate and rhythm, without murmurs, rubs, gallops, PMI non-displaced, no heaves or thrills on palpation.  Lungs: Clear to auscultation, no wheezing or rhonchi noted. No increased vocal fremitus resonant to percussion  Abdomen: Soft, nontender, nondistended, positive bowel sounds, no masses no hepatosplenomegaly noted..  Neuro: No focal neurological deficits noted cranial nerves II through XII grossly intact. DTRs 2+ bilaterally upper and lower extremities. Strength 5 out of 5 in bilateral upper and lower extremities. Musculoskeletal: No warm swelling or erythema around joints, no spinal tenderness noted. Psychiatric: Patient alert and oriented x3, good insight and cognition, good recent to remote recall. Lymph node survey: No cervical axillary  or inguinal lymphadenopathy noted.  Lab Results:  Basic Metabolic Panel:    Component Value Date/Time   NA 136 07/10/2020 0631   K 3.8 07/10/2020 0631   CL 101 07/10/2020 0631   CO2 25 07/10/2020 0631   BUN 12 07/10/2020 0631   CREATININE 0.77 07/10/2020 0631   GLUCOSE 87 07/10/2020 0631   CALCIUM 8.5 (L) 07/10/2020 0631   CBC:    Component Value Date/Time   WBC 16.1 (H) 07/10/2020 0631   HGB 9.3 (L) 07/10/2020 0631   HCT 25.9 (L) 07/10/2020 0631   PLT 450 (H) 07/10/2020 0631   MCV 90.6 07/10/2020 0631   NEUTROABS 11.1 (H) 07/10/2020 0631   LYMPHSABS 2.3 07/10/2020 0631   MONOABS 2.5 (H) 07/10/2020 0631   EOSABS 0.1 07/10/2020 0631   BASOSABS 0.1 07/10/2020 0631    Recent Results (from the past 240 hour(s))  Resp Panel by RT-PCR (Flu A&B, Covid) Nasopharyngeal Swab     Status: None   Collection Time: 07/09/20  4:13 AM   Specimen: Nasopharyngeal Swab; Nasopharyngeal(NP) swabs in vial transport medium  Result Value Ref Range Status   SARS Coronavirus 2 by RT PCR NEGATIVE NEGATIVE Final    Comment: (NOTE) SARS-CoV-2 target nucleic acids are NOT DETECTED.  The SARS-CoV-2 RNA is generally detectable in upper respiratory specimens during the acute phase of infection. The lowest concentration of SARS-CoV-2 viral copies this assay can detect is 138 copies/mL. A negative result does not preclude SARS-Cov-2 infection and should not be used as the sole basis for treatment or  other patient management decisions. A negative result may occur with  improper specimen collection/handling, submission of specimen other than nasopharyngeal swab, presence of viral mutation(s) within the areas targeted by this assay, and inadequate number of viral copies(<138 copies/mL). A negative result must be combined with clinical observations, patient history, and epidemiological information. The expected result is Negative.  Fact Sheet for Patients:   BloggerCourse.com  Fact Sheet for Healthcare Providers:  SeriousBroker.it  This test is no t yet approved or cleared by the [place] FDA and  has been authorized for detection and/or diagnosis of SARS-CoV-2 by FDA under an Emergency Use Authorization (EUA). This EUA will remain  in effect (meaning this test can be used) for the duration of the COVID-19 declaration under Section 564(b)(1) of the Act, 21 U.S.C.section 360bbb-3(b)(1), unless the authorization is terminated  or revoked sooner.       Influenza A by PCR NEGATIVE NEGATIVE Final   Influenza B by PCR NEGATIVE NEGATIVE Final    Comment: (NOTE) The Xpert Xpress SARS-CoV-2/FLU/RSV plus assay is intended as an aid in the diagnosis of influenza from Nasopharyngeal swab specimens and should not be used as a sole basis for treatment. Nasal washings and aspirates are unacceptable for Xpert Xpress SARS-CoV-2/FLU/RSV testing.  Fact Sheet for Patients: BloggerCourse.com  Fact Sheet for Healthcare Providers: SeriousBroker.it  This test is not yet approved or cleared by the [place] FDA and has been authorized for detection and/or diagnosis of SARS-CoV-2 by FDA under an Emergency Use Authorization (EUA). This EUA will remain in effect (meaning this test can be used) for the duration of the COVID-19 declaration under Section 564(b)(1) of the Act, 21 U.S.C. section 360bbb-3(b)(1), unless the authorization is terminated or revoked.  Performed at [facility], [address], [place], [place] [address]     Studies/Results: DG Chest Portable 1 View  Result Date: 07/09/2020 CLINICAL DATA:  20 year old male with sickle cell disease, pain crisis, hypoxia. EXAM: PORTABLE CHEST 1 VIEW COMPARISON:  Chest radiographs 02/18/2020 and earlier. FINDINGS: Portable AP semi upright view at 0512 hours. Normal lung  volumes and mediastinal contours. Visualized tracheal air column is within normal limits. Allowing for portable technique the lungs are clear. No pneumothorax or pleural effusion. Widespread H-shaped vertebral deformities in keeping with sequelae of sickle cell. No acute osseous abnormality identified. Negative visible bowel gas pattern. IMPRESSION: 1. No acute cardiopulmonary abnormality. 2. 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 Electronically Signed   By: Odessa Fleming M.D.   On: 07/09/2020 05:27    Medications: Scheduled Meds: . enoxaparin (LOVENOX) injection  40 mg Subcutaneous Q24H  . folic acid  1 mg Oral QHS  . HYDROmorphone   Intravenous Q4H  . hydroxyurea  1,500 mg Oral QHS  . ketorolac  15 mg Intravenous Q6H  . senna-docusate  1 tablet Oral BID   Continuous Infusions: . sodium chloride 125 mL/hr at 07/10/20 0444  . diphenhydrAMINE     PRN Meds:.acetaminophen, diphenhydrAMINE **OR** diphenhydrAMINE, naloxone **AND** sodium chloride flush, ondansetron (ZOFRAN) IV, oxyCODONE, polyethylene glycol  Consultants:  None  Procedures:  None  Antibiotics:  None  Assessment/Plan: Principal Problem:   Sickle cell pain crisis (HCC) Active Problems:   Vasoocclusive sickle cell crisis (HCC)   Leukocytosis   Chronic pain syndrome   Sickle cell disease with pain crisis: Decrease IV fluids, 0.45% saline at 50 mL/h IV Toradol 15 mg every 6 hours for total of 5 days Oxycodone 10 mg every 4 hours as  needed Monitor vital signs very closely, reevaluate pain scale regularly, and supplemental oxygen as needed.  Sickle cell anemia: Hemoglobin is stable and consistent with patient's baseline. There is no clinical indication for blood transfusion on today. Follow labs in AM.  Leukocytosis: WBC stable. Patient is afebrile without any signs of infection or inflammation. Continue to follow closely. Labs in AM.  Chronic pain syndrome: Continue home medications     Code Status:  Full Code Family Communication: N/A Disposition Plan: Not yet ready for discharge  Wanya Bangura Rennis Petty  APRN, MSN, FNP-C [facility] [address] [place], [place] [address] [phone]   If 5PM-AM, please contact night-coverage.  07/10/2020, 8:59 AM  LOS: 1 day

## 2020-07-10 NOTE — Progress Notes (Signed)
°   07/10/20 1447  Assess: MEWS Score  Temp 100.2 F (37.9 C)  BP 121/80  Pulse Rate (!) 114  Resp 18  Level of Consciousness Alert  SpO2 93 %  O2 Device Nasal Cannula  O2 Flow Rate (L/min) 2 L/min  Assess: MEWS Score  MEWS Temp 0  MEWS Systolic 0  MEWS Pulse 2  MEWS RR 0  MEWS LOC 0  MEWS Score 2  MEWS Score Color Yellow  Assess: if the MEWS score is Yellow or Red  Were vital signs taken at a resting state? Yes  Focused Assessment No change from prior assessment  Early Detection of Sepsis Score *See Row Information* Low  MEWS guidelines implemented *See Row Information* Yes  Treat  MEWS Interventions Administered scheduled meds/treatments  Pain Scale 0-10  Pain Score 4  Pain Location Head  Pain Intervention(s) Medication (See eMAR)  Complains of Fever  Take Vital Signs  Increase Vital Sign Frequency  Yellow: Q 2hr X 2 then Q 4hr X 2, if remains yellow, continue Q 4hrs  Escalate  MEWS: Escalate Yellow: discuss with charge nurse/RN and consider discussing with provider and RRT  Notify: Charge Nurse/RN  Name of Charge Nurse/RN Notified Jilda Panda  Date Charge Nurse/RN Notified 07/10/20  Time Charge Nurse/RN Notified 1448  Notify: Provider  Provider Name/Title [place] Hollis ,FNP  Date Provider Notified 07/10/20  Time Provider Notified 1448  Notification Type Face-to-face  Notification Reason Change in status  Response See new orders  Date of Provider Response 07/10/20  Time of Provider Response 1448

## 2020-07-11 DIAGNOSIS — D5700 Hb-SS disease with crisis, unspecified: Secondary | ICD-10-CM | POA: Diagnosis not present

## 2020-07-11 LAB — CBC
HCT: 25.8 % — ABNORMAL LOW (ref 39.0–52.0)
Hemoglobin: 9.3 g/dL — ABNORMAL LOW (ref 13.0–17.0)
MCH: 32.9 pg (ref 26.0–34.0)
MCHC: 36 g/dL (ref 30.0–36.0)
MCV: 91.2 fL (ref 80.0–100.0)
Platelets: 490 10*3/uL — ABNORMAL HIGH (ref 150–400)
RBC: 2.83 MIL/uL — ABNORMAL LOW (ref 4.22–5.81)
RDW: 19.1 % — ABNORMAL HIGH (ref 11.5–15.5)
WBC: 14.1 10*3/uL — ABNORMAL HIGH (ref 4.0–10.5)
nRBC: 2.7 % — ABNORMAL HIGH (ref 0.0–0.2)

## 2020-07-11 MED ORDER — HYDROMORPHONE 1 MG/ML IV SOLN
INTRAVENOUS | Status: DC
Start: 2020-07-11 — End: 2020-07-12
  Administered 2020-07-11: 3.5 mg via INTRAVENOUS
  Administered 2020-07-11 – 2020-07-12 (×2): 1.5 mg via INTRAVENOUS
  Administered 2020-07-12: 3.5 mg via INTRAVENOUS

## 2020-07-11 NOTE — Progress Notes (Signed)
Subjective: Matthew Frederick is a 19 year old male with a medical history significant for sickle cell disease, chronic pain syndrome, and anemia of chronic disease was admitted for sickle cell pain crisis.  Patient states that pain has worsened overnight.  Pain intensity is 8/10 primarily to low back.  He denies any chest pain, shortness of breath, urinary symptoms, nausea, vomiting, or diarrhea.  Objective:  Vital signs in last 24 hours:  Vitals:   07/10/20 2350 07/11/20 0358 07/11/20 0807 07/11/20 1023  BP:  114/73  128/81  Pulse:  (!) 110  (!) 110  Resp:  20 19 17   Temp:  99.4 F (37.4 C)  99.7 F (37.6 C)  TempSrc:  Oral  Oral  SpO2: 94% 96% 100% 97%  Weight:  58.3 kg    Height:        Intake/Output from previous day:   Intake/Output Summary (Last 24 hours) at 07/11/2020 1253 Last data filed at 07/11/2020 0336 Gross per 24 hour  Intake 1590.02 ml  Output --  Net 1590.02 ml    Physical Exam: General: Alert, awake, oriented x3, in no acute distress.  HEENT: [place]/AT PEERL, EOMI Neck: Trachea midline,  no masses, no thyromegal,y no JVD, no carotid bruit OROPHARYNX:  Moist, No exudate/ erythema/lesions.  Heart: Regular rate and rhythm, without murmurs, rubs, gallops, PMI non-displaced, no heaves or thrills on palpation.  Lungs: Clear to auscultation, no wheezing or rhonchi noted. No increased vocal fremitus resonant to percussion  Abdomen: Soft, nontender, nondistended, positive bowel sounds, no masses no hepatosplenomegaly noted..  Neuro: No focal neurological deficits noted cranial nerves II through XII grossly intact. DTRs 2+ bilaterally upper and lower extremities. Strength 5 out of 5 in bilateral upper and lower extremities. Musculoskeletal: No warm swelling or erythema around joints, no spinal tenderness noted. Psychiatric: Patient alert and oriented x3, good insight and cognition, good recent to remote recall. Lymph node survey: No cervical axillary or inguinal  lymphadenopathy noted.  Lab Results:  Basic Metabolic Panel:    Component Value Date/Time   NA 136 07/10/2020 0631   K 3.8 07/10/2020 0631   CL 101 07/10/2020 0631   CO2 25 07/10/2020 0631   BUN 12 07/10/2020 0631   CREATININE 0.77 07/10/2020 0631   GLUCOSE 87 07/10/2020 0631   CALCIUM 8.5 (L) 07/10/2020 0631   CBC:    Component Value Date/Time   WBC 14.1 (H) 07/11/2020 0521   HGB 9.3 (L) 07/11/2020 0521   HCT 25.8 (L) 07/11/2020 0521   PLT 490 (H) 07/11/2020 0521   MCV 91.2 07/11/2020 0521   NEUTROABS 11.1 (H) 07/10/2020 0631   LYMPHSABS 2.3 07/10/2020 0631   MONOABS 2.5 (H) 07/10/2020 0631   EOSABS 0.1 07/10/2020 0631   BASOSABS 0.1 07/10/2020 0631    Recent Results (from the past 240 hour(s))  Resp Panel by RT-PCR (Flu A&B, Covid) Nasopharyngeal Swab     Status: None   Collection Time: 07/09/20  4:13 AM   Specimen: Nasopharyngeal Swab; Nasopharyngeal(NP) swabs in vial transport medium  Result Value Ref Range Status   SARS Coronavirus 2 by RT PCR NEGATIVE NEGATIVE Final    Comment: (NOTE) SARS-CoV-2 target nucleic acids are NOT DETECTED.  The SARS-CoV-2 RNA is generally detectable in upper respiratory specimens during the acute phase of infection. The lowest concentration of SARS-CoV-2 viral copies this assay can detect is 138 copies/mL. A negative result does not preclude SARS-Cov-2 infection and should not be used as the sole basis for treatment or other patient management  decisions. A negative result may occur with  improper specimen collection/handling, submission of specimen other than nasopharyngeal swab, presence of viral mutation(s) within the areas targeted by this assay, and inadequate number of viral copies(<138 copies/mL). A negative result must be combined with clinical observations, patient history, and epidemiological information. The expected result is Negative.  Fact Sheet for Patients:  BloggerCourse.com  Fact Sheet  for Healthcare Providers:  SeriousBroker.it  This test is no t yet approved or cleared by the [place] FDA and  has been authorized for detection and/or diagnosis of SARS-CoV-2 by FDA under an Emergency Use Authorization (EUA). This EUA will remain  in effect (meaning this test can be used) for the duration of the COVID-19 declaration under Section 564(b)(1) of the Act, 21 U.S.C.section 360bbb-3(b)(1), unless the authorization is terminated  or revoked sooner.       Influenza A by PCR NEGATIVE NEGATIVE Final   Influenza B by PCR NEGATIVE NEGATIVE Final    Comment: (NOTE) The Xpert Xpress SARS-CoV-2/FLU/RSV plus assay is intended as an aid in the diagnosis of influenza from Nasopharyngeal swab specimens and should not be used as a sole basis for treatment. Nasal washings and aspirates are unacceptable for Xpert Xpress SARS-CoV-2/FLU/RSV testing.  Fact Sheet for Patients: BloggerCourse.com  Fact Sheet for Healthcare Providers: SeriousBroker.it  This test is not yet approved or cleared by the [place] FDA and has been authorized for detection and/or diagnosis of SARS-CoV-2 by FDA under an Emergency Use Authorization (EUA). This EUA will remain in effect (meaning this test can be used) for the duration of the COVID-19 declaration under Section 564(b)(1) of the Act, 21 U.S.C. section 360bbb-3(b)(1), unless the authorization is terminated or revoked.  Performed at [facility], [address], [place], [place] [address]   Blood culture (routine x 2)     Status: None (Preliminary result)   Collection Time: 07/09/20  4:41 AM   Specimen: BLOOD  Result Value Ref Range Status   Specimen Description   Final    BLOOD RIGHT ANTECUBITAL Performed at [facility], [address] [place], [place] [address]    Special Requests   Final    BOTTLES DRAWN AEROBIC AND  ANAEROBIC Blood Culture results may not be optimal due to an inadequate volume of blood received in culture bottles Performed at [facility], [address], [place], [place] [address]    Culture  Setup Time PENDING  Incomplete   Culture   Final    NO GROWTH 2 DAYS Performed at [facility], [address], [place], [place] [address]    Report Status PENDING  Incomplete  Blood culture (routine x 2)     Status: None (Preliminary result)   Collection Time: 07/09/20  4:46 AM   Specimen: BLOOD  Result Value Ref Range Status   Specimen Description   Final    BLOOD LEFT ANTECUBITAL Performed at [facility], [address] [place], [place] [address]    Special Requests   Final    BOTTLES DRAWN AEROBIC AND ANAEROBIC Blood Culture adequate volume Performed at [facility], [address] [place], [place] [address]    Culture   Final    NO GROWTH 2 DAYS Performed at [facility], [address] [place], [place] [address]    Report Status PENDING  Incomplete    Studies/Results: DG Chest 2 View  Result Date: 07/10/2020 CLINICAL DATA:  Sickle  cell crisis. EXAM: CHEST - 2 VIEW COMPARISON:  July 09, 2020. FINDINGS: The heart size and mediastinal contours are within normal limits. Both lungs are clear. No pneumothorax or pleural effusion is noted. Several H-shaped midthoracic vertebral bodies are noted consistent with history of sickle cell disease. IMPRESSION: No active cardiopulmonary disease. Electronically Signed   By: Lupita Raider M.D.   On: 07/10/2020 15:50    Medications: Scheduled Meds: . enoxaparin (LOVENOX) injection  40 mg Subcutaneous Q24H  . folic acid  1 mg Oral QHS  . HYDROmorphone   Intravenous Q4H  . hydroxyurea  1,500 mg Oral QHS  . ketorolac  15 mg Intravenous Q6H  . senna-docusate  1 tablet Oral BID   Continuous Infusions: . sodium chloride 50 mL/hr at 07/11/20 0336  . diphenhydrAMINE      PRN Meds:.acetaminophen, diphenhydrAMINE **OR** diphenhydrAMINE, naloxone **AND** sodium chloride flush, ondansetron (ZOFRAN) IV, oxyCODONE, polyethylene glycol  Consultants:  None  Procedures:  None  Antibiotics:  None  Assessment/Plan: Principal Problem:   Sickle cell pain crisis (HCC) Active Problems:   Vasoocclusive sickle cell crisis (HCC)   Leukocytosis   Chronic pain syndrome   Sickle cell disease with pain crisis: Continue IV fluids, 0.45% saline at 50 mL/h IV Dilaudid PCA, settings of 0.5 mg, 10-minute lockout, and 2.5 mg/h IV Toradol 15 mg every 6 hours for total of 5 days Oxycodone 10 mg every 4 hours as needed Monitor vital signs very closely, reevaluate pain scale regularly, and supplemental oxygen as needed.  Sickle cell anemia: Hemoglobin is stable and consistent with patient's baseline.  There is no clinical indication for blood transfusion on today.  CBC in a.m.  Leukocytosis: WBC stable.  Chest x-ray shows no acute cardiopulmonary process.  Patient is afebrile without any signs of infection or inflammation.  Continue to follow closely.  Chronic pain syndrome: Continue home medications  Code Status: Full Code Family Communication: N/A Disposition Plan: Not yet ready for discharge  Rayel Santizo Rennis Petty  APRN, MSN, FNP-C [facility] [address] [place], [place] [address] [phone]  If 5PM-7AM, please contact night-coverage.  07/11/2020, 12:53 PM  LOS: 2 days

## 2020-07-12 DIAGNOSIS — D5700 Hb-SS disease with crisis, unspecified: Secondary | ICD-10-CM | POA: Diagnosis not present

## 2020-07-12 MED ORDER — HYDROMORPHONE HCL 2 MG/ML IJ SOLN
2.0000 mg | Freq: Four times a day (QID) | INTRAMUSCULAR | Status: DC | PRN
Start: 2020-07-12 — End: 2020-07-13

## 2020-07-12 NOTE — Progress Notes (Signed)
°   07/12/20 0012  Assess: MEWS Score  Temp (!) 100.7 F (38.2 C)  BP 113/72  Pulse Rate (!) 109  Resp 16  Level of Consciousness Alert  SpO2 95 %  O2 Device Nasal Cannula  Assess: MEWS Score  MEWS Temp 1  MEWS Systolic 0  MEWS Pulse 1  MEWS RR 0  MEWS LOC 0  MEWS Score 2  MEWS Score Color Yellow  Assess: if the MEWS score is Yellow or Red  Were vital signs taken at a resting state? Yes  Focused Assessment No change from prior assessment  Early Detection of Sepsis Score *See Row Information* Low  MEWS guidelines implemented *See Row Information* Yes

## 2020-07-12 NOTE — Progress Notes (Signed)
Subjective: Matthew Frederick is a 19 year old male with a medical history significant for sickle cell disease, chronic pain syndrome, and anemia of chronic disease was admitted for sickle cell pain crisis.  Patient states that pain intensity has improved some overnight.  He rates pain as 3/10.  He is requesting an additional day.  He states that he does not feel comfortable going home on today.  He denies any headache, chest pain, urinary symptoms, nausea, vomiting, or diarrhea.  Objective:  Vital signs in last 24 hours:  Vitals:   07/12/20 0705 07/12/20 0749 07/12/20 1203 07/12/20 1213  BP:  118/78 124/77   Pulse:  (!) 102 (!) 105   Resp: 17 16 17 17   Temp:  98.5 F (36.9 C) 98.8 F (37.1 C)   TempSrc:  Oral Oral   SpO2: 95% 92% 94% 95%  Weight:      Height:        Intake/Output from previous day:   Intake/Output Summary (Last 24 hours) at 07/12/2020 1302 Last data filed at 07/12/2020 0530 Gross per 24 hour  Intake 1353.73 ml  Output --  Net 1353.73 ml    Physical Exam: General: Alert, awake, oriented x3, in no acute distress.  HEENT: [place]/AT PEERL, EOMI Neck: Trachea midline,  no masses, no thyromegal,y no JVD, no carotid bruit OROPHARYNX:  Moist, No exudate/ erythema/lesions.  Heart: Regular rate and rhythm, without murmurs, rubs, gallops, PMI non-displaced, no heaves or thrills on palpation.  Lungs: Clear to auscultation, no wheezing or rhonchi noted. No increased vocal fremitus resonant to percussion  Abdomen: Soft, nontender, nondistended, positive bowel sounds, no masses no hepatosplenomegaly noted..  Neuro: No focal neurological deficits noted cranial nerves II through XII grossly intact. DTRs 2+ bilaterally upper and lower extremities. Strength 5 out of 5 in bilateral upper and lower extremities. Musculoskeletal: No warm swelling or erythema around joints, no spinal tenderness noted. Psychiatric: Patient alert and oriented x3, good insight and cognition, good recent  to remote recall. Lymph node survey: No cervical axillary or inguinal lymphadenopathy noted.  Lab Results:  Basic Metabolic Panel:    Component Value Date/Time   NA 136 07/10/2020 0631   K 3.8 07/10/2020 0631   CL 101 07/10/2020 0631   CO2 25 07/10/2020 0631   BUN 12 07/10/2020 0631   CREATININE 0.77 07/10/2020 0631   GLUCOSE 87 07/10/2020 0631   CALCIUM 8.5 (L) 07/10/2020 0631   CBC:    Component Value Date/Time   WBC 14.1 (H) 07/11/2020 0521   HGB 9.3 (L) 07/11/2020 0521   HCT 25.8 (L) 07/11/2020 0521   PLT 490 (H) 07/11/2020 0521   MCV 91.2 07/11/2020 0521   NEUTROABS 11.1 (H) 07/10/2020 0631   LYMPHSABS 2.3 07/10/2020 0631   MONOABS 2.5 (H) 07/10/2020 0631   EOSABS 0.1 07/10/2020 0631   BASOSABS 0.1 07/10/2020 0631    Recent Results (from the past 240 hour(s))  Resp Panel by RT-PCR (Flu A&B, Covid) Nasopharyngeal Swab     Status: None   Collection Time: 07/09/20  4:13 AM   Specimen: Nasopharyngeal Swab; Nasopharyngeal(NP) swabs in vial transport medium  Result Value Ref Range Status   SARS Coronavirus 2 by RT PCR NEGATIVE NEGATIVE Final    Comment: (NOTE) SARS-CoV-2 target nucleic acids are NOT DETECTED.  The SARS-CoV-2 RNA is generally detectable in upper respiratory specimens during the acute phase of infection. The lowest concentration of SARS-CoV-2 viral copies this assay can detect is 138 copies/mL. A negative result does not  preclude SARS-Cov-2 infection and should not be used as the sole basis for treatment or other patient management decisions. A negative result may occur with  improper specimen collection/handling, submission of specimen other than nasopharyngeal swab, presence of viral mutation(s) within the areas targeted by this assay, and inadequate number of viral copies(<138 copies/mL). A negative result must be combined with clinical observations, patient history, and epidemiological information. The expected result is Negative.  Fact Sheet  for Patients:  BloggerCourse.com  Fact Sheet for Healthcare Providers:  SeriousBroker.it  This test is no t yet approved or cleared by the [place] FDA and  has been authorized for detection and/or diagnosis of SARS-CoV-2 by FDA under an Emergency Use Authorization (EUA). This EUA will remain  in effect (meaning this test can be used) for the duration of the COVID-19 declaration under Section 564(b)(1) of the Act, 21 U.S.C.section 360bbb-3(b)(1), unless the authorization is terminated  or revoked sooner.       Influenza A by PCR NEGATIVE NEGATIVE Final   Influenza B by PCR NEGATIVE NEGATIVE Final    Comment: (NOTE) The Xpert Xpress SARS-CoV-2/FLU/RSV plus assay is intended as an aid in the diagnosis of influenza from Nasopharyngeal swab specimens and should not be used as a sole basis for treatment. Nasal washings and aspirates are unacceptable for Xpert Xpress SARS-CoV-2/FLU/RSV testing.  Fact Sheet for Patients: BloggerCourse.com  Fact Sheet for Healthcare Providers: SeriousBroker.it  This test is not yet approved or cleared by the [place] FDA and has been authorized for detection and/or diagnosis of SARS-CoV-2 by FDA under an Emergency Use Authorization (EUA). This EUA will remain in effect (meaning this test can be used) for the duration of the COVID-19 declaration under Section 564(b)(1) of the Act, 21 U.S.C. section 360bbb-3(b)(1), unless the authorization is terminated or revoked.  Performed at [facility], [address] [place], [place] [address]   Blood culture (routine x 2)     Status: None (Preliminary result)   Collection Time: 07/09/20  4:41 AM   Specimen: BLOOD  Result Value Ref Range Status   Specimen Description   Final    BLOOD RIGHT ANTECUBITAL Performed at [facility], [address], [place],  [place] [address]    Special Requests   Final    BOTTLES DRAWN AEROBIC AND ANAEROBIC Blood Culture results may not be optimal due to an inadequate volume of blood received in culture bottles Performed at [facility], [address] [place], [place] [address]    Culture  Setup Time PENDING  Incomplete   Culture   Final    NO GROWTH 3 DAYS Performed at [facility], [address] [place] [address], [place], [place] [address]    Report Status PENDING  Incomplete  Blood culture (routine x 2)     Status: None (Preliminary result)   Collection Time: 07/09/20  4:46 AM   Specimen: BLOOD  Result Value Ref Range Status   Specimen Description   Final    BLOOD LEFT ANTECUBITAL Performed at [facility], [address] [place] [address] [place], [place] [address]    Special Requests   Final    BOTTLES DRAWN AEROBIC AND ANAEROBIC Blood Culture adequate volume Performed at [facility], [address], [place], [place] [address]    Culture   Final    NO GROWTH 3 DAYS Performed at [facility], [address], [place], [place] [address]    Report Status PENDING  Incomplete    Studies/Results: DG Chest 2 View  Result Date: 07/10/2020 CLINICAL DATA:  Sickle cell crisis. EXAM: CHEST - 2 VIEW COMPARISON:  July 09, 2020. FINDINGS: The heart size and mediastinal contours are within normal limits. Both lungs are clear. No pneumothorax or pleural effusion is noted. Several H-shaped midthoracic vertebral bodies are noted consistent with history of sickle cell disease. IMPRESSION: No active cardiopulmonary disease. Electronically Signed   By: Lupita Raider M.D.   On: 07/10/2020 15:50    Medications: Scheduled Meds: . enoxaparin (LOVENOX) injection  40 mg Subcutaneous Q24H  . folic acid  1 mg Oral QHS  . hydroxyurea  1,500 mg Oral QHS  . ketorolac  15 mg Intravenous Q6H  . senna-docusate  1 tablet Oral BID   Continuous Infusions: PRN Meds:.acetaminophen,  HYDROmorphone (DILAUDID) injection, oxyCODONE, polyethylene glycol  Consultants:  None  Procedures:  None  Antibiotics:  None  Assessment/Plan: Principal Problem:   Sickle cell pain crisis (HCC) Active Problems:   Vasoocclusive sickle cell crisis (HCC)   Leukocytosis   Chronic pain syndrome   Sickle cell disease with pain crisis:  Continue IV fluids to KVO Discontinue IV Dilaudid PCA Dilaudid 2 mg every 6 hours as needed for severe pain Continue home medications, oxycodone 10 mg every 4 hours as needed IV Toradol 15 mg every 6 hours for total of 5 days Monitor vital signs very closely, reevaluate pain scale regularly, and supplemental oxygen as needed. Sickle cell anemia: Hemoglobin is stable and consistent with patient's baseline.  There is no clinical indication for blood transfusion today.  Repeat CBC.  Leukocytosis: WBCs stable and consistent with patient's baseline.  Patient is afebrile without any signs of infection or inflammation.  Continue to follow closely.  Chronic pain syndrome: Continue home medications   Code Status: Full Code Family Communication: N/A Disposition Plan: Not yet ready for discharge  Rowyn Mustapha Rennis Petty  APRN, MSN, FNP-C [facility] [address] [place], [place] [address] [phone]  If 5PM-8AM, please contact night-coverage.  07/12/2020, 1:02 PM  LOS: 3 days

## 2020-07-13 DIAGNOSIS — D5700 Hb-SS disease with crisis, unspecified: Secondary | ICD-10-CM | POA: Diagnosis not present

## 2020-07-13 LAB — CULTURE, BLOOD (ROUTINE X 2)

## 2020-07-13 NOTE — Discharge Summary (Signed)
Physician Discharge Summary  Matthew Frederick HKV:[id number] DOB: [birth year] DOA: 07/09/2020  PCP: Matthew Rakers, MD  Admit date: 07/09/2020  Discharge date: 07/14/2020  Discharge Diagnoses:  Principal Problem:   Sickle cell pain crisis (HCC) Active Problems:   Vasoocclusive sickle cell crisis (HCC)   Leukocytosis   Chronic pain syndrome   Discharge Condition: Stable  Disposition:   Follow-up Information    Call  Matthew Rakers, MD.   Specialty: Family Medicine Why: As needed Contact information: [address] [place] [address] [phone]        Go to  [facility]   Specialty: Emergency Medicine Why: If symptoms worsen Contact information: [address] [id number] [address] [address] [phone]             Pt is discharged home in good condition and is to follow up with Matthew Rakers, MD this week to have labs evaluated. [facility] York Goucher is instructed to increase activity slowly and balance with rest for the next few days, and use prescribed medication to complete treatment of pain  Diet: Regular Wt Readings from Last 3 Encounters:  07/11/20 58.3 kg (10 %, Z= -1.28)*  05/31/20 61.5 kg (19 %, Z= -0.88)*  05/26/20 65.8 kg (34 %, Z= -0.42)*   * Growth percentiles are based on CDC (Boys, 2-20 Years) data.    History of present illness:  Matthew Frederick is a 19 year old male with a medical history significant for sickle cell disease, chronic pain syndrome, and history of anemia of chronic disease who presents to the emergency department with major complaints of generalized body pain that is consistent with his typical sickle cell pain crisis.  He claims that he was in his usual state of health until about 4 hours prior to presentation when his pain escalated from 0-10/10.  He took his home pain medications of oxycodone 10 mg with no sustained relief.  At the time of this admission, pain  intensity is 7/10 characterized as throbbing and aching, generalized but mostly in his lower extremities.  He denies any trauma or falls.  He denies any nausea or vomiting.  No fever, chest pain, no shortness of breath.  Patient denies any sick contacts, recent travel or contact with COVID-19 patient.  He denies any headache, blurred vision, joint swelling, or redness.  ER course: Updated vital signs were: BP 101/60, pulse 86, but temperature 98 F, respirations 18, oxygen saturation 99% on RA.  Patient was found to be in no acute distress, normal appearance.  Unremarkable physical examination.  Respiratory panel negative.  CBC showed elevated white count to 18.6, hemoglobin 10.1, platelets 406.  Comprehensive metabolic panel was unremarkable.  Patient received multiple doses of morphine and Toradol but pain persisted.  Patient has been admitted for further evaluation of sickle cell pain crisis.  Hospital Course:  Sickle cell disease: Patient was admitted for sickle cell pain crisis and managed appropriately with IVF, IV Dilaudid via PCA and IV Toradol, as well as other adjunct therapies per sickle cell pain management protocols. IV Dilaudid PCA was weaned appropriately.  Patient transition to home medications.  Advised to resume all home medications and follow-up with PCP within 1 week to repeat CBC with differential and CMP. Patient is alert, oriented, and ambulating without assistance.  Patient advised to follow-up with hematologist as scheduled. Patient was therefore discharged home today in a hemodynamically stable condition.   Matthew Frederick will follow-up with PCP within 1 week of  this discharge. Matthew Frederick was counseled extensively about nonpharmacologic means of pain management, patient verbalized understanding and was appreciative of  the care received during this admission.   We discussed the need for good hydration, monitoring of hydration status, avoidance of heat, cold, stress, and infection  triggers. We discussed the need to be adherent with taking Hydrea and other home medications. Patient was reminded of the need to seek medical attention immediately if any symptom of bleeding, anemia, or infection occurs.  Discharge Exam: Vitals:   07/13/20 0554 07/13/20 1021  BP: 110/72 129/81  Pulse: 92 84  Resp: 14 18  Temp: 98.3 F (36.8 C) 97.9 F (36.6 C)  SpO2: 96% 100%   Vitals:   07/12/20 2015 07/13/20 0012 07/13/20 0554 07/13/20 1021  BP: 122/89 127/79 110/72 129/81  Pulse: 100 (!) 105 92 84  Resp: 14 14 14 18   Temp: 99.3 F (37.4 C) 99.5 F (37.5 C) 98.3 F (36.8 C) 97.9 F (36.6 C)  TempSrc: Oral Oral Oral Oral  SpO2: 95% 94% 96% 100%  Weight:      Height:        General appearance : Awake, alert, not in any distress. Speech Clear. Not toxic looking HEENT: Atraumatic and Normocephalic, pupils equally reactive to light and accomodation Neck: Supple, no JVD. No cervical lymphadenopathy.  Chest: Good air entry bilaterally, no added sounds  CVS: S1 S2 regular, no murmurs.  Abdomen: Bowel sounds present, Non tender and not distended with no gaurding, rigidity or rebound. Extremities: B/L Lower Ext shows no edema, both legs are warm to touch Neurology: Awake alert, and oriented X 3, CN II-XII intact, Non focal Skin: No Rash  Discharge Instructions  Discharge Instructions    Discharge patient   Complete by: As directed    Discharge disposition: 01-Home or Self Care   Discharge patient date: 07/13/2020     Allergies as of 07/13/2020   No Known Allergies     Medication List    TAKE these medications   acetaminophen 500 MG tablet Commonly known as: TYLENOL Take 2 tablets (1,000 mg total) by mouth every 6 (six) hours as needed for headache.   folic acid 1 MG tablet Commonly known as: FOLVITE Take 1 mg by mouth at bedtime.   HYDROcodone-acetaminophen 5-325 MG tablet Commonly known as: NORCO/VICODIN Take 1 tablet by mouth every 6 (six) hours as needed  for severe pain.   hydroxyurea 500 MG capsule Commonly known as: HYDREA Take 1,500 mg by mouth at bedtime.   ibuprofen 800 MG tablet Commonly known as: ADVIL Take 1 tablet (800 mg total) by mouth every 8 (eight) hours as needed.   Oxycodone HCl 10 MG Tabs Take 1 tablet (10 mg total) by mouth every 4 (four) hours as needed.       The results of significant diagnostics from this hospitalization (including imaging, microbiology, ancillary and laboratory) are listed below for reference.    Significant Diagnostic Studies: DG Chest 2 View  Result Date: 07/10/2020 CLINICAL DATA:  Sickle cell crisis. EXAM: CHEST - 2 VIEW COMPARISON:  July 09, 2020. FINDINGS: The heart size and mediastinal contours are within normal limits. Both lungs are clear. No pneumothorax or pleural effusion is noted. Several H-shaped midthoracic vertebral bodies are noted consistent with history of sickle cell disease. IMPRESSION: No active cardiopulmonary disease. Electronically Signed   By: July 11, 2020 M.D.   On: 07/10/2020 15:50   DG Chest Portable 1 View  Result Date: 07/09/2020 CLINICAL DATA:  19 year old male with sickle cell disease, pain crisis, hypoxia. EXAM: PORTABLE CHEST 1 VIEW COMPARISON:  Chest radiographs 02/18/2020 and earlier. FINDINGS: Portable AP semi upright view at 0512 hours. Normal lung volumes and mediastinal contours. Visualized tracheal air column is within normal limits. Allowing for portable technique the lungs are clear. No pneumothorax or pleural effusion. Widespread H-shaped vertebral deformities in keeping with sequelae of sickle cell. No acute osseous abnormality identified. Negative visible bowel gas pattern. IMPRESSION: 1. No acute cardiopulmonary abnormality. 2. 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 Electronically Signed   By: Odessa FlemingH  Hall M.D.   On: 07/09/2020 05:27    Microbiology: Recent Results (from the past 240 hour(s))  Resp Panel by RT-PCR (Flu A&B, Covid)  Nasopharyngeal Swab     Status: None   Collection Time: 07/09/20  4:13 AM   Specimen: Nasopharyngeal Swab; Nasopharyngeal(NP) swabs in vial transport medium  Result Value Ref Range Status   SARS Coronavirus 2 by RT PCR NEGATIVE NEGATIVE Final    Comment: (NOTE) SARS-CoV-2 target nucleic acids are NOT DETECTED.  The SARS-CoV-2 RNA is generally detectable in upper respiratory specimens during the acute phase of infection. The lowest concentration of SARS-CoV-2 viral copies this assay can detect is 138 copies/mL. A negative result does not preclude SARS-Cov-2 infection and should not be used as the sole basis for treatment or other patient management decisions. A negative result may occur with  improper specimen collection/handling, submission of specimen other than nasopharyngeal swab, presence of viral mutation(s) within the areas targeted by this assay, and inadequate number of viral copies(<138 copies/mL). A negative result must be combined with clinical observations, patient history, and epidemiological information. The expected result is Negative.  Fact Sheet for Patients:  BloggerCourse.comhttps://www.fda.gov/media/152166/download  Fact Sheet for Healthcare Providers:  SeriousBroker.ithttps://www.fda.gov/media/152162/download  This test is no t yet approved or cleared by the [place] FDA and  has been authorized for detection and/or diagnosis of SARS-CoV-2 by FDA under an Emergency Use Authorization (EUA). This EUA will remain  in effect (meaning this test can be used) for the duration of the COVID-19 declaration under Section 564(b)(1) of the Act, 21 U.S.C.section 360bbb-3(b)(1), unless the authorization is terminated  or revoked sooner.       Influenza A by PCR NEGATIVE NEGATIVE Final   Influenza B by PCR NEGATIVE NEGATIVE Final    Comment: (NOTE) The Xpert Xpress SARS-CoV-2/FLU/RSV plus assay is intended as an aid in the diagnosis of influenza from Nasopharyngeal swab specimens and should not be  used as a sole basis for treatment. Nasal washings and aspirates are unacceptable for Xpert Xpress SARS-CoV-2/FLU/RSV testing.  Fact Sheet for Patients: BloggerCourse.comhttps://www.fda.gov/media/152166/download  Fact Sheet for Healthcare Providers: SeriousBroker.ithttps://www.fda.gov/media/152162/download  This test is not yet approved or cleared by the [place] FDA and has been authorized for detection and/or diagnosis of SARS-CoV-2 by FDA under an Emergency Use Authorization (EUA). This EUA will remain in effect (meaning this test can be used) for the duration of the COVID-19 declaration under Section 564(b)(1) of the Act, 21 U.S.C. section 360bbb-3(b)(1), unless the authorization is terminated or revoked.  Performed at [facility], [address] [place], [place] [address]   Blood culture (routine x 2)     Status: None   Collection Time: 07/09/20  4:41 AM   Specimen: BLOOD  Result Value Ref Range Status   Specimen Description   Final    BLOOD RIGHT ANTECUBITAL Performed at [facility], [address] [place], [place]  [address]    Special Requests   Final    BOTTLES DRAWN AEROBIC AND ANAEROBIC Blood Culture results may not be optimal due to an inadequate volume of blood received in culture bottles Performed at [facility], [address] [place], [place] [address]    Culture   Final    NO GROWTH 5 DAYS Performed at [facility], [address], [place], [place] [address]    Report Status 07/14/2020 FINAL  Final  Blood culture (routine x 2)     Status: None   Collection Time: 07/09/20  4:46 AM   Specimen: BLOOD  Result Value Ref Range Status   Specimen Description   Final    BLOOD LEFT ANTECUBITAL Performed at [facility], [address], [place], [place] [address]    Special Requests   Final    BOTTLES DRAWN AEROBIC AND ANAEROBIC Blood Culture adequate volume Performed at [facility],  [address] [place], [place] [address]    Culture   Final    NO GROWTH 5 DAYS Performed at [facility], [address], [place], [place] [address]    Report Status 07/14/2020 FINAL  Final     Labs: Basic Metabolic Panel: Recent Labs  Lab 07/08/20 2219 07/10/20 0631  NA 139 136  K 3.7 3.8  CL 105 101  CO2 26 25  GLUCOSE 142* 87  BUN 10 12  CREATININE 0.92 0.77  CALCIUM 9.2 8.5*   Liver Function Tests: Recent Labs  Lab 07/08/20 2219  AST 30  ALT 12  ALKPHOS 107  BILITOT 3.9*  PROT 7.7  ALBUMIN 4.1   No results for input(s): LIPASE, AMYLASE in the last 168 hours. No results for input(s): AMMONIA in the last 168 hours. CBC: Recent Labs  Lab 07/08/20 2219 07/10/20 0631 07/11/20 0521  WBC 18.6* 16.1* 14.1*  NEUTROABS 10.7* 11.1*  --   HGB 10.1* 9.3* 9.3*  HCT 27.8* 25.9* 25.8*  MCV 89.7 90.6 91.2  PLT 406* 450* 490*   Cardiac Enzymes: No results for input(s): CKTOTAL, CKMB, CKMBINDEX, TROPONINI in the last 168 hours. BNP: Invalid input(s): POCBNP CBG: No results for input(s): GLUCAP in the last 168 hours.  Time coordinating discharge: 35 minutes  Signed: [name]  APRN, MSN, FNP-C Patient Care [facility] [address] [place], [place] [address] [phone]  [facility] 07/14/2020, 4:18 PM

## 2020-07-14 LAB — CULTURE, BLOOD (ROUTINE X 2)
Culture: NO GROWTH
Culture: NO GROWTH
Special Requests: ADEQUATE

## 2020-07-16 ENCOUNTER — Telehealth: Payer: Self-pay

## 2020-07-16 NOTE — Telephone Encounter (Signed)
Transition Care Management Unsuccessful Follow-up Telephone Call  Date of discharge and from where:  [facility] 07/13/2020  Attempts:  1st Attempt  Reason for unsuccessful TCM follow-up call:  Left voice message

## 2020-07-18 NOTE — Telephone Encounter (Signed)
Transition Care Management Unsuccessful Follow-up Telephone Call  Date of discharge and from where:  07/13/2020 [facility]   Attempts:  2nd Attempt  Reason for unsuccessful TCM follow-up call:  Left voice message

## 2020-07-19 NOTE — Telephone Encounter (Signed)
Transition Care Management Unsuccessful Follow-up Telephone Call  Date of discharge and from where:  [facility] 07/13/2020   Attempts:  3rd Attempt  Reason for unsuccessful TCM follow-up call:  Left voice message

## 2020-07-24 ENCOUNTER — Other Ambulatory Visit: Payer: Self-pay | Admitting: Nurse Practitioner

## 2020-07-24 ENCOUNTER — Ambulatory Visit
Admission: RE | Admit: 2020-07-24 | Discharge: 2020-07-24 | Disposition: A | Discharge status: Home / Self Care | Payer: Medicaid Other | Source: Ambulatory Visit | Attending: Nurse Practitioner | Admitting: Nurse Practitioner

## 2020-07-24 DIAGNOSIS — Z20822 Contact with and (suspected) exposure to covid-19: Secondary | ICD-10-CM | POA: Diagnosis not present

## 2020-07-24 DIAGNOSIS — R0602 Shortness of breath: Secondary | ICD-10-CM

## 2020-07-24 DIAGNOSIS — D57811 Other sickle-cell disorders with acute chest syndrome: Secondary | ICD-10-CM | POA: Diagnosis not present

## 2020-07-24 DIAGNOSIS — R0782 Intercostal pain: Secondary | ICD-10-CM | POA: Diagnosis not present

## 2020-07-24 DIAGNOSIS — Z7189 Other specified counseling: Secondary | ICD-10-CM | POA: Diagnosis not present

## 2020-08-13 ENCOUNTER — Encounter (HOSPITAL_COMMUNITY): Payer: Self-pay

## 2020-08-13 ENCOUNTER — Emergency Department (HOSPITAL_COMMUNITY)
Admission: EM | Admit: 2020-08-13 | Discharge: 2020-08-13 | Disposition: A | Discharge status: Home / Self Care | Payer: Medicaid Other | Attending: Emergency Medicine | Admitting: Emergency Medicine

## 2020-08-13 ENCOUNTER — Other Ambulatory Visit: Payer: Self-pay

## 2020-08-13 DIAGNOSIS — D5700 Hb-SS disease with crisis, unspecified: Secondary | ICD-10-CM | POA: Insufficient documentation

## 2020-08-13 LAB — RETICULOCYTES
Immature Retic Fract: 33 % — ABNORMAL HIGH (ref 2.3–15.9)
RBC.: 3.21 MIL/uL — ABNORMAL LOW (ref 4.22–5.81)
Retic Count, Absolute: 400 10*3/uL — ABNORMAL HIGH (ref 19.0–186.0)
Retic Ct Pct: 12.9 % — ABNORMAL HIGH (ref 0.4–3.1)

## 2020-08-13 LAB — COMPREHENSIVE METABOLIC PANEL
ALT: 13 U/L (ref 0–44)
AST: 27 U/L (ref 15–41)
Albumin: 3.9 g/dL (ref 3.5–5.0)
Alkaline Phosphatase: 116 U/L (ref 38–126)
Anion gap: 10 (ref 5–15)
BUN: 9 mg/dL (ref 6–20)
CO2: 25 mmol/L (ref 22–32)
Calcium: 9.2 mg/dL (ref 8.9–10.3)
Chloride: 103 mmol/L (ref 98–111)
Creatinine, Ser: 0.71 mg/dL (ref 0.61–1.24)
GFR, Estimated: >60 mL/min (ref >60)
Glucose, Bld: 122 mg/dL — ABNORMAL HIGH (ref 70–99)
Potassium: 3.8 mmol/L (ref 3.5–5.1)
Sodium: 138 mmol/L (ref 135–145)
Total Bilirubin: 3.7 mg/dL — ABNORMAL HIGH (ref 0.3–1.2)
Total Protein: 7.9 g/dL (ref 6.5–8.1)

## 2020-08-13 LAB — CBC WITH DIFFERENTIAL/PLATELET
Abs Immature Granulocytes: 0.11 10*3/uL — ABNORMAL HIGH (ref 0.00–0.07)
Basophils Absolute: 0.2 10*3/uL — ABNORMAL HIGH (ref 0.0–0.1)
Basophils Relative: 1 %
Eosinophils Absolute: 0.1 10*3/uL (ref 0.0–0.5)
Eosinophils Relative: 1 %
HCT: 28.4 % — ABNORMAL LOW (ref 39.0–52.0)
Hemoglobin: 10 g/dL — ABNORMAL LOW (ref 13.0–17.0)
Immature Granulocytes: 1 %
Lymphocytes Relative: 15 %
Lymphs Abs: 2.7 10*3/uL (ref 0.7–4.0)
MCH: 31.3 pg (ref 26.0–34.0)
MCHC: 35.2 g/dL (ref 30.0–36.0)
MCV: 88.8 fL (ref 80.0–100.0)
Monocytes Absolute: 1.9 10*3/uL — ABNORMAL HIGH (ref 0.1–1.0)
Monocytes Relative: 11 %
Neutro Abs: 12.5 10*3/uL — ABNORMAL HIGH (ref 1.7–7.7)
Neutrophils Relative %: 71 %
Platelets: 556 10*3/uL — ABNORMAL HIGH (ref 150–400)
RBC: 3.2 MIL/uL — ABNORMAL LOW (ref 4.22–5.81)
RDW: 21.5 % — ABNORMAL HIGH (ref 11.5–15.5)
WBC: 17.5 10*3/uL — ABNORMAL HIGH (ref 4.0–10.5)
nRBC: 1.3 % — ABNORMAL HIGH (ref 0.0–0.2)

## 2020-08-13 MED ORDER — HYDROMORPHONE HCL 2 MG/ML IJ SOLN
2.0000 mg | INTRAMUSCULAR | Status: AC
  Administered 2020-08-13: 2 mg via INTRAVENOUS
  Filled 2020-08-13: qty 1

## 2020-08-13 MED ORDER — ONDANSETRON 8 MG PO TBDP: 8.0000 mg | ORAL_TABLET | Freq: Three times a day (TID) | ORAL | Status: DC | PRN

## 2020-08-13 MED ORDER — SODIUM CHLORIDE 0.45 % IV SOLN: INTRAVENOUS | Status: DC

## 2020-08-13 MED ORDER — KETOROLAC TROMETHAMINE 15 MG/ML IJ SOLN
15.0000 mg | INTRAMUSCULAR | Status: AC
  Administered 2020-08-13: 15 mg via INTRAVENOUS
  Filled 2020-08-13: qty 1

## 2020-08-13 MED ORDER — DIPHENHYDRAMINE HCL 25 MG PO CAPS: 25.0000 mg | ORAL_CAPSULE | ORAL | Status: DC | PRN

## 2020-08-13 NOTE — ED Triage Notes (Signed)
Pt presents with c/o sickle cell crisis pain in both arms and both legs. Pt reports the pain started approx 50 minutes ago.

## 2020-08-13 NOTE — ED Provider Notes (Signed)
[facility] Provider Note   CSN: [id number] Arrival date & time: 08/13/20  7253     History Chief Complaint  Patient presents with  . Sickle Cell Pain Crisis    Matthew Frederick is a 20 y.o. male.  The history is provided by the patient and medical records. No language interpreter was used.  Sickle Cell Pain Crisis    20 year old male significant history of sickle cell disease presenting with sickle cell related pain.  Patient report he woke up this morning with pain to his arms and legs.  Pain is sharp achy throbbing 9 out of 10, persistent, felt similar to prior sickle cell crisis.  He believes it could be weather changes triggering his pain.  He does not complain of any fever chest pain shortness of breath productive cough URI symptoms.  Denies nausea vomiting diarrhea.  He did try taking his home medication prior to coming here with minimal improvement.  He was last admitted approximately a month ago for similar presentation  Past Medical History:  Diagnosis Date  . Sickle cell anemia [facility])     Patient Active Problem List   Diagnosis Date Noted  . Acute intractable headache 05/28/2020  . Chronic pain syndrome 05/28/2020  . Sickle cell pain crisis (HCC) 01/21/2020  . Anemia   . Sickle-cell crisis (HCC) 09/03/2019  . Sickle cell anemia with pain (HCC) 08/05/2019  . Abnormal liver function 04/02/2019  . Thrombocytopenia (HCC) 11/24/2018  . Leukocytosis 11/22/2018  . Chest pain   . Coccyx pain   . Vasoocclusive sickle cell crisis (HCC)   . Fever   . Sickle cell crisis (HCC) 07/23/2016  . Transition of care performed with sharing of clinical summary 04/28/2016  . Need for immunization against influenza 04/22/2012    History reviewed. No pertinent surgical history.     Family History  Problem Relation Age of Onset  . Sickle cell anemia Father   . Diabetes Maternal Grandmother     Social History   Tobacco Use  . Smoking  status: Never Smoker  . Smokeless tobacco: Never Used  Vaping Use  . Vaping Use: Never used  Substance Use Topics  . Alcohol use: No  . Drug use: No    Home Medications Prior to Admission medications   Medication Sig Start Date End Date Taking? Authorizing Provider  acetaminophen (TYLENOL) 500 MG tablet Take 2 tablets (1,000 mg total) by mouth every 6 (six) hours as needed for headache. 06/01/20   Massie Maroon, FNP  folic acid (FOLVITE) 1 MG tablet Take 1 mg by mouth at bedtime.     [provider]  HYDROcodone-acetaminophen (NORCO/VICODIN) 5-325 MG tablet Take 1 tablet by mouth every 6 (six) hours as needed for severe pain. 05/26/20   Farrel Gordon, PA-C  hydroxyurea (HYDREA) 500 MG capsule Take 1,500 mg by mouth at bedtime.     [provider]  ibuprofen (ADVIL) 800 MG tablet Take 1 tablet (800 mg total) by mouth every 8 (eight) hours as needed. 06/01/20   Massie Maroon, FNP  Oxycodone HCl 10 MG TABS Take 1 tablet (10 mg total) by mouth every 4 (four) hours as needed. 06/01/20   Massie Maroon, FNP    Allergies    Patient has no known allergies.  Review of Systems   Review of Systems  All other systems reviewed and are negative.   Physical Exam Updated Vital Signs BP 117/64 (BP Location: Left Arm)   Pulse  90   Temp 98.6 F (37 C) (Oral)   Resp 18   SpO2 95%   Physical Exam Vitals and nursing note reviewed.  Constitutional:      General: He is not in acute distress.    Appearance: He is well-developed and well-nourished.  HENT:     Head: Atraumatic.  Eyes:     Conjunctiva/sclera: Conjunctivae normal.  Cardiovascular:     Rate and Rhythm: Normal rate and regular rhythm.     Pulses: Normal pulses.     Heart sounds: Normal heart sounds.  Pulmonary:     Effort: Pulmonary effort is normal.     Breath sounds: Normal breath sounds.  Abdominal:     Palpations: Abdomen is soft.     Tenderness: There is no abdominal tenderness.   Musculoskeletal:        General: Normal range of motion.     Cervical back: Neck supple.  Skin:    Findings: No rash.  Neurological:     Mental Status: He is alert and oriented to person, place, and time.  Psychiatric:        Mood and Affect: Mood and affect and mood normal.     ED Results / Procedures / Treatments   Labs (all labs ordered are listed, but only abnormal results are displayed) Labs Reviewed  COMPREHENSIVE METABOLIC PANEL - Abnormal; Notable for the following components:      Result Value   Glucose, Bld 122 (*)    Total Bilirubin 3.7 (*)    All other components within normal limits  CBC WITH DIFFERENTIAL/PLATELET - Abnormal; Notable for the following components:   WBC 17.5 (*)    RBC 3.20 (*)    Hemoglobin 10.0 (*)    HCT 28.4 (*)    RDW 21.5 (*)    Platelets 556 (*)    nRBC 1.3 (*)    Neutro Abs 12.5 (*)    Monocytes Absolute 1.9 (*)    Basophils Absolute 0.2 (*)    Abs Immature Granulocytes 0.11 (*)    All other components within normal limits  RETICULOCYTES - Abnormal; Notable for the following components:   Retic Ct Pct 12.9 (*)    RBC. 3.21 (*)    Retic Count, Absolute 400.0 (*)    Immature Retic Fract 33.0 (*)    All other components within normal limits    EKG None  Radiology No results found.  Procedures Procedures   Medications Ordered in ED Medications  0.45 % sodium chloride infusion ( Intravenous New Bag/Given 08/13/20 0852)  diphenhydrAMINE (BENADRYL) capsule 25-50 mg (has no administration in time range)  ondansetron (ZOFRAN-ODT) disintegrating tablet 8 mg (has no administration in time range)  ketorolac (TORADOL) 15 MG/ML injection 15 mg (15 mg Intravenous Given 08/13/20 0854)  HYDROmorphone (DILAUDID) injection 2 mg (2 mg Intravenous Given 08/13/20 0856)  HYDROmorphone (DILAUDID) injection 2 mg (2 mg Intravenous Given 08/13/20 0936)    ED Course  I have reviewed the triage vital signs and the nursing notes.  Pertinent labs &  imaging results that were available during my care of the patient were reviewed by me and considered in my medical decision making (see chart for details).    MDM Rules/Calculators/A&P                          BP (!) 129/96   Pulse 65   Temp 98.6 F (37 C) (Oral)   Resp (!) 21  Ht 5\' 10"  (1.778 m)   Wt 59 kg   SpO2 92%   BMI 18.65 kg/m   Final Clinical Impression(s) / ED Diagnoses Final diagnoses:  Sickle cell anemia with pain (HCC)    Rx / DC Orders ED Discharge Orders    None     8:34 AM Patient complains of pain to arms and neck that feels similar to prior sickle cell crisis.  Triggers may be due to weather changes.  No fever chest pain shortness of breath or cough to suggest acute chest syndrome.  No nausea vomiting or diarrhea.  Patient overall well-appearing.  I initially reached out to our sickle cell clinic and spoke with [place],DNP, who recommend treating patient's pain in the ED and if no improvement patient can be admitted by her.  10:45 AM Patient received 3 dose of pain medication and on reassessment he felt comfortable enough to go home and will follow-up outpatient.  He understands to return if symptoms worsen.  Labs obtained and are at baseline.  He is stable for discharge.   , PA-C 08/13/20 1047    Little, 08/15/20, MD 08/13/20 [phone]

## 2020-08-13 NOTE — Discharge Instructions (Addendum)
Please continue taking your pain medication as needed.  Call and follow-up closely with the sickle cell clinic for further management.  Return to the ER if your symptoms worsen or if you have any other concern.

## 2020-09-15 ENCOUNTER — Other Ambulatory Visit: Payer: Self-pay

## 2020-09-15 ENCOUNTER — Emergency Department (HOSPITAL_COMMUNITY): Payer: Medicare Other

## 2020-09-15 ENCOUNTER — Encounter (HOSPITAL_COMMUNITY): Payer: Self-pay

## 2020-09-15 ENCOUNTER — Inpatient Hospital Stay (HOSPITAL_COMMUNITY)
Admission: EM | Admit: 2020-09-15 | Discharge: 2020-09-17 | DRG: 812 | Disposition: A | Discharge status: Home / Self Care | Payer: Medicare Other | Attending: Internal Medicine | Admitting: Internal Medicine

## 2020-09-15 ENCOUNTER — Other Ambulatory Visit (HOSPITAL_COMMUNITY): Payer: Self-pay

## 2020-09-15 DIAGNOSIS — Z79899 Other long term (current) drug therapy: Secondary | ICD-10-CM | POA: Diagnosis not present

## 2020-09-15 DIAGNOSIS — Z888 Allergy status to other drugs, medicaments and biological substances status: Secondary | ICD-10-CM | POA: Diagnosis not present

## 2020-09-15 DIAGNOSIS — D5700 Hb-SS disease with crisis, unspecified: Principal | ICD-10-CM | POA: Diagnosis present

## 2020-09-15 DIAGNOSIS — Z832 Family history of diseases of the blood and blood-forming organs and certain disorders involving the immune mechanism: Secondary | ICD-10-CM

## 2020-09-15 DIAGNOSIS — G894 Chronic pain syndrome: Secondary | ICD-10-CM | POA: Diagnosis present

## 2020-09-15 DIAGNOSIS — Z20822 Contact with and (suspected) exposure to covid-19: Secondary | ICD-10-CM | POA: Diagnosis present

## 2020-09-15 LAB — CBC WITH DIFFERENTIAL/PLATELET
Abs Immature Granulocytes: 0.15 10*3/uL — ABNORMAL HIGH (ref 0.00–0.07)
Basophils Absolute: 0.1 10*3/uL (ref 0.0–0.1)
Basophils Relative: 1 %
Eosinophils Absolute: 0.2 10*3/uL (ref 0.0–0.5)
Eosinophils Relative: 1 %
HCT: 25.9 % — ABNORMAL LOW (ref 39.0–52.0)
Hemoglobin: 9.4 g/dL — ABNORMAL LOW (ref 13.0–17.0)
Immature Granulocytes: 1 %
Lymphocytes Relative: 18 %
Lymphs Abs: 2.6 10*3/uL (ref 0.7–4.0)
MCH: 32.3 pg (ref 26.0–34.0)
MCHC: 36.3 g/dL — ABNORMAL HIGH (ref 30.0–36.0)
MCV: 89 fL (ref 80.0–100.0)
Monocytes Absolute: 2.1 10*3/uL — ABNORMAL HIGH (ref 0.1–1.0)
Monocytes Relative: 14 %
Neutro Abs: 9.6 10*3/uL — ABNORMAL HIGH (ref 1.7–7.7)
Neutrophils Relative %: 65 %
Platelets: 436 10*3/uL — ABNORMAL HIGH (ref 150–400)
RBC: 2.91 MIL/uL — ABNORMAL LOW (ref 4.22–5.81)
RDW: 20 % — ABNORMAL HIGH (ref 11.5–15.5)
WBC: 14.7 10*3/uL — ABNORMAL HIGH (ref 4.0–10.5)
nRBC: 2.1 % — ABNORMAL HIGH (ref 0.0–0.2)

## 2020-09-15 LAB — COMPREHENSIVE METABOLIC PANEL
ALT: 10 U/L (ref 0–44)
AST: 25 U/L (ref 15–41)
Albumin: 4 g/dL (ref 3.5–5.0)
Alkaline Phosphatase: 80 U/L (ref 38–126)
Anion gap: 8 (ref 5–15)
BUN: 9 mg/dL (ref 6–20)
CO2: 24 mmol/L (ref 22–32)
Calcium: 8.7 mg/dL — ABNORMAL LOW (ref 8.9–10.3)
Chloride: 107 mmol/L (ref 98–111)
Creatinine, Ser: 0.67 mg/dL (ref 0.61–1.24)
GFR, Estimated: >60 mL/min (ref >60)
Glucose, Bld: 93 mg/dL (ref 70–99)
Potassium: 3.2 mmol/L — ABNORMAL LOW (ref 3.5–5.1)
Sodium: 139 mmol/L (ref 135–145)
Total Bilirubin: 5.5 mg/dL — ABNORMAL HIGH (ref 0.3–1.2)
Total Protein: 7.1 g/dL (ref 6.5–8.1)

## 2020-09-15 LAB — RETICULOCYTES
Immature Retic Fract: 27.6 % — ABNORMAL HIGH (ref 2.3–15.9)
RBC.: 2.88 MIL/uL — ABNORMAL LOW (ref 4.22–5.81)
Retic Count, Absolute: 532 10*3/uL — ABNORMAL HIGH (ref 19.0–186.0)
Retic Ct Pct: 18 % — ABNORMAL HIGH (ref 0.4–3.1)

## 2020-09-15 LAB — TROPONIN I (HIGH SENSITIVITY)
Troponin I (High Sensitivity): 4 ng/L (ref <18)
Troponin I (High Sensitivity): 5 ng/L (ref <18)

## 2020-09-15 LAB — RESP PANEL BY RT-PCR (FLU A&B, COVID) ARPGX2
Influenza A by PCR: NEGATIVE
Influenza B by PCR: NEGATIVE
SARS Coronavirus 2 by RT PCR: NEGATIVE

## 2020-09-15 MED ORDER — DIPHENHYDRAMINE HCL 50 MG/ML IJ SOLN
25.0000 mg | Freq: Once | INTRAMUSCULAR | Status: AC
  Administered 2020-09-15: 25 mg via INTRAVENOUS
  Filled 2020-09-15: qty 1

## 2020-09-15 MED ORDER — POLYETHYLENE GLYCOL 3350 17 G PO PACK: 17.0000 g | PACK | Freq: Every day | ORAL | Status: DC | PRN

## 2020-09-15 MED ORDER — ENOXAPARIN SODIUM 40 MG/0.4ML ~~LOC~~ SOLN
40.0000 mg | SUBCUTANEOUS | Status: DC
  Administered 2020-09-15 – 2020-09-16 (×2): 40 mg via SUBCUTANEOUS
  Filled 2020-09-15 (×2): qty 0.4

## 2020-09-15 MED ORDER — ONDANSETRON HCL 4 MG/2ML IJ SOLN
4.0000 mg | Freq: Four times a day (QID) | INTRAMUSCULAR | Status: DC | PRN
  Administered 2020-09-16: 4 mg via INTRAVENOUS
  Filled 2020-09-15: qty 2

## 2020-09-15 MED ORDER — HYDROMORPHONE HCL 2 MG/ML IJ SOLN
2.0000 mg | Freq: Once | INTRAMUSCULAR | Status: AC
  Administered 2020-09-15: 2 mg via INTRAVENOUS
  Filled 2020-09-15: qty 1

## 2020-09-15 MED ORDER — HYDROXYUREA 500 MG PO CAPS
1500.0000 mg | ORAL_CAPSULE | Freq: Every day | ORAL | Status: DC
  Administered 2020-09-15: 1500 mg via ORAL
  Filled 2020-09-15 (×3): qty 3

## 2020-09-15 MED ORDER — ACETAMINOPHEN 500 MG PO TABS: 1000.0000 mg | ORAL_TABLET | Freq: Four times a day (QID) | ORAL | Status: DC | PRN

## 2020-09-15 MED ORDER — KETOROLAC TROMETHAMINE 30 MG/ML IJ SOLN
30.0000 mg | Freq: Four times a day (QID) | INTRAMUSCULAR | Status: DC
  Administered 2020-09-15 – 2020-09-16 (×3): 30 mg via INTRAVENOUS
  Filled 2020-09-15 (×3): qty 1

## 2020-09-15 MED ORDER — SODIUM CHLORIDE 0.9 % IV BOLUS
1000.0000 mL | Freq: Once | INTRAVENOUS | Status: AC
  Administered 2020-09-15: 1000 mL via INTRAVENOUS

## 2020-09-15 MED ORDER — SENNOSIDES-DOCUSATE SODIUM 8.6-50 MG PO TABS
1.0000 | ORAL_TABLET | Freq: Two times a day (BID) | ORAL | Status: DC
  Administered 2020-09-15 – 2020-09-16 (×2): 1 via ORAL
  Filled 2020-09-15 (×3): qty 1

## 2020-09-15 MED ORDER — DIPHENHYDRAMINE HCL 25 MG PO CAPS: 25.0000 mg | ORAL_CAPSULE | ORAL | Status: DC | PRN

## 2020-09-15 MED ORDER — HYDROMORPHONE 1 MG/ML IV SOLN
INTRAVENOUS | Status: DC
Start: 2020-09-15 — End: 2020-09-17
  Administered 2020-09-15: 30 mg via INTRAVENOUS
  Administered 2020-09-16: 1 mg via INTRAVENOUS
  Administered 2020-09-16: 1.5 mg via INTRAVENOUS
  Administered 2020-09-16: 1 mg via INTRAVENOUS
  Administered 2020-09-16: 3 mg via INTRAVENOUS
  Administered 2020-09-16: 0.5 mg via INTRAVENOUS
  Administered 2020-09-16: 1 mg via INTRAVENOUS
  Administered 2020-09-17: 5 mg via INTRAVENOUS
  Filled 2020-09-15: qty 30

## 2020-09-15 MED ORDER — HYDROMORPHONE HCL 2 MG/ML IJ SOLN
2.0000 mg | INTRAMUSCULAR | Status: AC
  Administered 2020-09-15: 2 mg via INTRAVENOUS
  Filled 2020-09-15: qty 1

## 2020-09-15 MED ORDER — SODIUM CHLORIDE 0.9 % IV SOLN
25.0000 mg | INTRAVENOUS | Status: DC | PRN
  Filled 2020-09-15 (×2): qty 0.5

## 2020-09-15 MED ORDER — SODIUM CHLORIDE 0.9% FLUSH: 9.0000 mL | INTRAVENOUS | Status: DC | PRN

## 2020-09-15 MED ORDER — SODIUM CHLORIDE 0.45 % IV SOLN: INTRAVENOUS | Status: DC

## 2020-09-15 MED ORDER — NALOXONE HCL 0.4 MG/ML IJ SOLN: 0.4000 mg | INTRAMUSCULAR | Status: DC | PRN

## 2020-09-15 MED ORDER — IOHEXOL 350 MG/ML SOLN
100.0000 mL | Freq: Once | INTRAVENOUS | Status: AC | PRN
  Administered 2020-09-15: 100 mL via INTRAVENOUS

## 2020-09-15 MED ORDER — FOLIC ACID 1 MG PO TABS
1.0000 mg | ORAL_TABLET | Freq: Every day | ORAL | Status: DC
  Administered 2020-09-15 – 2020-09-16 (×2): 1 mg via ORAL
  Filled 2020-09-15 (×2): qty 1

## 2020-09-15 NOTE — ED Notes (Signed)
Pts O2 noted to be at 89% RA. Pt placed on 2L O2 [place]. O2 currently 95%

## 2020-09-15 NOTE — ED Triage Notes (Signed)
Patient c/o sickle cell pain in his chest that started today.

## 2020-09-15 NOTE — ED Provider Notes (Signed)
[facility]-EMERGENCY DEPT Provider Note   CSN: [id number] Arrival date & time: 09/15/20  1421     History Chief Complaint  Patient presents with  . Sickle Cell Pain Crisis    Matthew Frederick is a 20 y.o. male.  HPI 20 year old male with history of sickle cell anemia presents to the ER with complaints of central chest pain which began this morning.  He also complains of pain all over, believes that he is in a sickle cell crisis, and states that his chest pains feels like he has acute chest syndrome.  Reports prior history of this in the past.  He has some mild shortness of breath.  He took his home 10 mg of oxycodone with little relief.  Denies any fevers or chills.  No hemoptysis, abdominal pain, nausea or vomiting.    Past Medical History:  Diagnosis Date  . Sickle cell anemia [facility])     Patient Active Problem List   Diagnosis Date Noted  . Acute intractable headache 05/28/2020  . Chronic pain syndrome 05/28/2020  . Sickle cell pain crisis (HCC) 01/21/2020  . Anemia   . Sickle-cell crisis (HCC) 09/03/2019  . Sickle cell anemia with pain (HCC) 08/05/2019  . Abnormal liver function 04/02/2019  . Thrombocytopenia (HCC) 11/24/2018  . Leukocytosis 11/22/2018  . Chest pain   . Coccyx pain   . Vasoocclusive sickle cell crisis (HCC)   . Fever   . Sickle cell crisis (HCC) 07/23/2016  . Transition of care performed with sharing of clinical summary 04/28/2016  . Need for immunization against influenza 04/22/2012    History reviewed. No pertinent surgical history.     Family History  Problem Relation Age of Onset  . Sickle cell anemia Father   . Diabetes Maternal Grandmother     Social History   Tobacco Use  . Smoking status: Never Smoker  . Smokeless tobacco: Never Used  Vaping Use  . Vaping Use: Never used  Substance Use Topics  . Alcohol use: No  . Drug use: No    Home Medications Prior to Admission medications   Medication Sig  Start Date End Date Taking? Authorizing Provider  acetaminophen (TYLENOL) 500 MG tablet Take 2 tablets (1,000 mg total) by mouth every 6 (six) hours as needed for headache. 06/01/20  Yes Massie Maroon, FNP  folic acid (FOLVITE) 1 MG tablet Take 1 mg by mouth at bedtime.    Yes [provider]  hydroxyurea (HYDREA) 500 MG capsule Take 1,500 mg by mouth at bedtime.    Yes [provider]  Oxycodone HCl 10 MG TABS Take 1 tablet (10 mg total) by mouth every 4 (four) hours as needed. 06/01/20  Yes Massie Maroon, FNP    Allergies    Omnipaque [iohexol]  Review of Systems   Review of Systems  Constitutional: Negative for chills and fever.  HENT: Negative for ear pain and sore throat.   Eyes: Negative for pain and visual disturbance.  Respiratory: Positive for shortness of breath. Negative for cough.   Cardiovascular: Positive for chest pain. Negative for palpitations.  Gastrointestinal: Negative for abdominal pain and vomiting.  Genitourinary: Negative for dysuria and hematuria.  Musculoskeletal: Negative for arthralgias and back pain.  Skin: Negative for color change and rash.  Neurological: Negative for seizures and syncope.  All other systems reviewed and are negative.   Physical Exam Updated Vital Signs BP 110/74   Pulse 98   Temp 98.6 F (37 C) (Oral)  Resp 12   Ht 5\' 10"  (1.778 m)   Wt 62.6 kg   SpO2 98%   BMI 19.80 kg/m   Physical Exam Vitals and nursing note reviewed.  Constitutional:      Appearance: Normal appearance. He is well-developed and well-nourished.     Comments: Uncomfortable appearing, however nontoxic, speaking in full sentences without increased work of breathing  HENT:     Head: Normocephalic and atraumatic.  Eyes:     General:        Right eye: No discharge.        Left eye: No discharge.     Extraocular Movements: Extraocular movements intact.     Conjunctiva/sclera: Conjunctivae normal.  Cardiovascular:     Rate and  Rhythm: Normal rate and regular rhythm.     Pulses: Normal pulses.     Heart sounds: Normal heart sounds. No murmur heard.   Pulmonary:     Effort: Pulmonary effort is normal. No respiratory distress.     Breath sounds: Normal breath sounds.  Abdominal:     General: Abdomen is flat.     Palpations: Abdomen is soft.     Tenderness: There is no abdominal tenderness.  Musculoskeletal:        General: No swelling or edema. Normal range of motion.     Cervical back: Neck supple.  Skin:    General: Skin is warm and dry.  Neurological:     General: No focal deficit present.     Mental Status: He is alert and oriented to person, place, and time.  Psychiatric:        Mood and Affect: Mood and affect and mood normal.        Behavior: Behavior normal.     ED Results / Procedures / Treatments   Labs (all labs ordered are listed, but only abnormal results are displayed) Labs Reviewed  COMPREHENSIVE METABOLIC PANEL - Abnormal; Notable for the following components:      Result Value   Potassium 3.2 (*)    Calcium 8.7 (*)    Total Bilirubin 5.5 (*)    All other components within normal limits  CBC WITH DIFFERENTIAL/PLATELET - Abnormal; Notable for the following components:   WBC 14.7 (*)    RBC 2.91 (*)    Hemoglobin 9.4 (*)    HCT 25.9 (*)    MCHC 36.3 (*)    RDW 20.0 (*)    Platelets 436 (*)    nRBC 2.1 (*)    Neutro Abs 9.6 (*)    Monocytes Absolute 2.1 (*)    Abs Immature Granulocytes 0.15 (*)    All other components within normal limits  RETICULOCYTES - Abnormal; Notable for the following components:   Retic Ct Pct 18.0 (*)    RBC. 2.88 (*)    Retic Count, Absolute 532.0 (*)    Immature Retic Fract 27.6 (*)    All other components within normal limits  RESP PANEL BY RT-PCR (FLU A&B, COVID) ARPGX2  HIV ANTIBODY (ROUTINE TESTING W REFLEX)  TROPONIN I (HIGH SENSITIVITY)  TROPONIN I (HIGH SENSITIVITY)    EKG EKG Interpretation  Date/Time:  Saturday September 15 2020  15:15:38 EST Ventricular Rate:  82 PR Interval:    QRS Duration: 88 QT Interval:  374 QTC Calculation: 437 R Axis:   73 Text Interpretation: Sinus rhythm LVH by voltage No significant change since last tracing Abnormal ECG Confirmed by 06-03-2006 [phone]) on 09/15/2020 4:03:47 PM   Radiology DG Chest  2 View  Result Date: 09/15/2020 CLINICAL DATA:  Chest pain.  Sickle cell pain.  Pain onset today. EXAM: CHEST - 2 VIEW COMPARISON:  Radiograph 07/24/2020 FINDINGS: Improved left basilar opacity with mild residual scarring. Resolved opacity at the right lung base. There may be tiny bilateral pleural effusions. Upper normal heart size with normal mediastinal contours. No pulmonary edema. No pneumothorax. Osseous sequela of sickle cell. Presumed external artifact projecting over the right anterior chest. IMPRESSION: Improved bibasilar opacities since 07/24/2020 with mild residual left basilar scarring. Possible tiny pleural effusions. Electronically Signed   By: Narda Rutherford M.D.   On: 09/15/2020 14:55   CT Angio Chest PE W and/or Wo Contrast  Result Date: 09/15/2020 CLINICAL DATA:  Sickle cell anemia. Concern for pulmonary embolism chest pain and vomiting. EXAM: CT ANGIOGRAPHY CHEST WITH CONTRAST TECHNIQUE: Multidetector CT imaging of the chest was performed using the standard protocol during bolus administration of intravenous contrast. Multiplanar CT image reconstructions and MIPs were obtained to evaluate the vascular anatomy. CONTRAST:  OMNIPAQUE IOHEXOL 350 MG/ML SOLN COMPARISON:  None. FINDINGS: Cardiovascular: No filling defects within the pulmonary arteries to suggest acute pulmonary embolism. Mediastinum/Nodes: No axillary or supraclavicular adenopathy. No mediastinal or hilar adenopathy. No pericardial fluid. Esophagus normal. Lungs/Pleura: No pneumonia.  No pleural fluid. Upper Abdomen: Auto infarcted spleen. Musculoskeletal: Diffuse sclerosis the bones. Biconcave vertebral body  endplates Review of the MIP images confirms the above findings. IMPRESSION: 1. No evidence acute pulmonary embolism. 2. No pneumonia or evidence of acute chest syndrome. 3. Osseous sequelae sickle cell disease noted. Electronically Signed   By: Genevive Bi M.D.   On: 09/15/2020 18:23    Procedures Procedures   Medications Ordered in ED Medications  senna-docusate (Senokot-S) tablet 1 tablet (has no administration in time range)  polyethylene glycol (MIRALAX / GLYCOLAX) packet 17 g (has no administration in time range)  enoxaparin (LOVENOX) injection 40 mg (has no administration in time range)  ketorolac (TORADOL) 30 MG/ML injection 30 mg (has no administration in time range)  0.45 % sodium chloride infusion (has no administration in time range)  naloxone (NARCAN) injection 0.4 mg (has no administration in time range)    And  sodium chloride flush (NS) 0.9 % injection 9 mL (has no administration in time range)  ondansetron (ZOFRAN) injection 4 mg (has no administration in time range)  diphenhydrAMINE (BENADRYL) capsule 25 mg (has no administration in time range)    Or  diphenhydrAMINE (BENADRYL) 25 mg in sodium chloride 0.9 % 50 mL IVPB (has no administration in time range)  HYDROmorphone (DILAUDID) 1 mg/mL PCA injection (has no administration in time range)  hydroxyurea (HYDREA) capsule 1,500 mg (has no administration in time range)  folic acid (FOLVITE) tablet 1 mg (has no administration in time range)  acetaminophen (TYLENOL) tablet 1,000 mg (has no administration in time range)  diphenhydrAMINE (BENADRYL) injection 25 mg (25 mg Intravenous Given 09/15/20 1526)  HYDROmorphone (DILAUDID) injection 2 mg (2 mg Intravenous Given 09/15/20 1527)  HYDROmorphone (DILAUDID) injection 2 mg (2 mg Intravenous Given 09/15/20 1627)  sodium chloride 0.9 % bolus 1,000 mL (0 mLs Intravenous Stopped 09/15/20 1628)  HYDROmorphone (DILAUDID) injection 2 mg (2 mg Intravenous Given 09/15/20 1716)  iohexol  (OMNIPAQUE) 350 MG/ML injection 100 mL (100 mLs Intravenous Contrast Given 09/15/20 1756)  diphenhydrAMINE (BENADRYL) injection 25 mg (25 mg Intravenous Given 09/15/20 1820)    ED Course  I have reviewed the triage vital signs and the nursing notes.  Pertinent labs &  imaging results that were available during my care of the patient were reviewed by me and considered in my medical decision making (see chart for details).  Clinical Course as of 09/15/20 1929  Sat Sep 15, 2020  [id number] 20 year old male who presents with sickle cell crisis.  On arrival, he is uncomfortable appearing, however nontoxic, speaking full sentences without increased work of breathing.  Vitals overall are reassuring though his blood pressures are little bit soft with a blood pressure of 99/45 on arrival.  He is afebrile, not hypoxic, tachycardic.  On exam, lung sounds clear, speaking full sentences, abdomen is soft and nontender.  Plan for basic labs, troponin, reticulocyte count, chest x-ray [MB]  1534 WBC(!): 14.7 [MB]  1534 Hemoglobin(!): 9.4 Slightly decreased from prior but overall stable. [MB]  1534 BP(!): 99/45 [MB]  1535 DG Chest 2 View IMPRESSION: Improved bibasilar opacities since 07/24/2020 with mild residual left basilar scarring. Possible tiny pleural effusions   [MB]  1537 Plan for pain management, fluids, reassessment.  Patient may require CT scan if symptoms do not improve as per discussion with Dr. Jeraldine LootsLockwood [MB]  1547 Retic Ct Pct(!): 18.0 [MB]  1547 Retic Count, Absolute(!): 532.0 [MB]  1547 Troponin I (High Sensitivity) [MB]  1659 Patient reevaluated after 2 rounds of Dilaudid.  Heart rate now at 115-120, reports mild improvement in pain but still very much prevalent.  Patient did desaturate to about 88% after receiving Dilaudid, nursing staff placed on 2 L [place].  Patient reports that he like to be admitted. Plan for PE study and then admission. [MB]  1843 CT Angio Chest PE W and/or Wo  Contrast IMPRESSION: 1. No evidence acute pulmonary embolism. 2. No pneumonia or evidence of acute chest syndrome. 3. Osseous sequelae sickle cell disease noted. [MB]  1843 CT PE study is negative. Blood pressure has improved with fluids. Plan for admission given poor pain control. [MB]  [email or web address] Spoke with hospitalist Dr. Mehtaab ReilGardner who will admit the patient for further evaluation and treatment.  Patient is currently stable at this time.  Discussed the case with Dr. Jeraldine LootsLockwood who is agreeable to the above plan and disposition [MB]    Clinical Course User Index [MB] Leone BrandBelaya, Maria A, PA-C   MDM Rules/Calculators/A&P                           Final Clinical Impression(s) / ED Diagnoses Final diagnoses:  Sickle cell pain crisis [facility](HCC)    Rx / DC Orders ED Discharge Orders    None       Leone BrandBelaya, Maria A, PA-C 09/15/20 1929    Gerhard MunchLockwood, Robert, MD 09/16/20 0005

## 2020-09-15 NOTE — H&P (Signed)
History and Physical    Broden Holt [place]ock SMO:[id number] DOB: 11-24-2000 DOA: 09/15/2020  PCP: [name], MD  Patient coming from: Home  I have personally briefly reviewed patient's old medical records in [facility]  Chief Complaint: Sickle cell pain crisis  HPI: Matthew Frederick is a 20 y.o. male with medical history significant of sickle cell anemia.  Pt presents to ED with c/o CP, onset this AM.  Also has pain all over.  H/o same in past, feels like sickle cell crisis.  Symptoms constant, severe, not helped by home oxy.  No fevers, chills, abd pain, N/V.   ED Course: Pain persists despite multiple rounds of dilaudid.  CTA chest neg for PE or acute findings.   Review of Systems: As per HPI, otherwise all review of systems negative.  Past Medical History:  Diagnosis Date  . Sickle cell anemia (HCC)     History reviewed. No pertinent surgical history.   reports that he has never smoked. He has never used smokeless tobacco. He reports that he does not drink alcohol and does not use drugs.  Allergies  Allergen Reactions  . Omnipaque [Iohexol] Itching    Family History  Problem Relation Age of Onset  . Sickle cell anemia Father   . Diabetes Maternal Grandmother      Prior to Admission medications   Medication Sig Start Date End Date Taking? Authorizing Provider  acetaminophen (TYLENOL) 500 MG tablet Take 2 tablets (1,000 mg total) by mouth every 6 (six) hours as needed for headache. 06/01/20  Yes Massie Maroon, FNP  folic acid (FOLVITE) 1 MG tablet Take 1 mg by mouth at bedtime.    Yes [provider]  hydroxyurea (HYDREA) 500 MG capsule Take 1,500 mg by mouth at bedtime.    Yes [provider]  Oxycodone HCl 10 MG TABS Take 1 tablet (10 mg total) by mouth every 4 (four) hours as needed. 06/01/20  Yes Massie Maroon, FNP    Physical Exam: Vitals:   09/15/20 1630 09/15/20 1700 09/15/20 1730 09/15/20 1850  BP: (!) 107/59  130/62 114/60 110/74  Pulse: 85 99 (!) 103 98  Resp: 12 12 15 12   Temp:      TempSrc:      SpO2: 97% 97% 93% 98%  Weight:      Height:        Constitutional: NAD, calm, comfortable Eyes: PERRL, lids and conjunctivae normal ENMT: Mucous membranes are moist. Posterior pharynx clear of any exudate or lesions.Normal dentition.  Neck: normal, supple, no masses, no thyromegaly Respiratory: clear to auscultation bilaterally, no wheezing, no crackles. Normal respiratory effort. No accessory muscle use.  Cardiovascular: Regular rate and rhythm, no murmurs / rubs / gallops. No extremity edema. 2+ pedal pulses. No carotid bruits.  Abdomen: no tenderness, no masses palpated. No hepatosplenomegaly. Bowel sounds positive.  Musculoskeletal: no clubbing / cyanosis. No joint deformity upper and lower extremities. Good ROM, no contractures. Normal muscle tone.  Skin: no rashes, lesions, ulcers. No induration Neurologic: CN 2-12 grossly intact. Sensation intact, DTR normal. Strength 5/5 in all 4.  Psychiatric: Normal judgment and insight. Alert and oriented x 3. Normal mood.    Labs on Admission: I have personally reviewed following labs and imaging studies  CBC: Recent Labs  Lab 09/15/20 1435  WBC 14.7*  NEUTROABS 9.6*  HGB 9.4*  HCT 25.9*  MCV 89.0  PLT 436*   Basic Metabolic Panel: Recent Labs  Lab 09/15/20 1435  NA  139  K 3.2*  CL 107  CO2 24  GLUCOSE 93  BUN 9  CREATININE 0.67  CALCIUM 8.7*   GFR: Estimated Creatinine Clearance: 131.5 mL/min (by C-G formula based on SCr of 0.67 mg/dL). Liver Function Tests: Recent Labs  Lab 09/15/20 1435  AST 25  ALT 10  ALKPHOS 80  BILITOT 5.5*  PROT 7.1  ALBUMIN 4.0   No results for input(s): LIPASE, AMYLASE in the last 168 hours. No results for input(s): AMMONIA in the last 168 hours. Coagulation Profile: No results for input(s): INR, PROTIME in the last 168 hours. Cardiac Enzymes: No results for input(s): CKTOTAL, CKMB,  CKMBINDEX, TROPONINI in the last 168 hours. BNP (last 3 results) No results for input(s): PROBNP in the last 8760 hours. HbA1C: No results for input(s): HGBA1C in the last 72 hours. CBG: No results for input(s): GLUCAP in the last 168 hours. Lipid Profile: No results for input(s): CHOL, HDL, LDLCALC, TRIG, CHOLHDL, LDLDIRECT in the last 72 hours. Thyroid Function Tests: No results for input(s): TSH, T4TOTAL, FREET4, T3FREE, THYROIDAB in the last 72 hours. Anemia Panel: Recent Labs    09/15/20 1435  RETICCTPCT 18.0*   Urine analysis:    Component Value Date/Time   COLORURINE YELLOW 09/08/2019 2226   APPEARANCEUR CLEAR 09/08/2019 2226   LABSPEC 1.014 09/08/2019 2226   PHURINE 7.0 09/08/2019 2226   GLUCOSEU NEGATIVE 09/08/2019 2226   HGBUR NEGATIVE 09/08/2019 2226   BILIRUBINUR NEGATIVE 09/08/2019 2226   KETONESUR NEGATIVE 09/08/2019 2226   PROTEINUR NEGATIVE 09/08/2019 2226   NITRITE NEGATIVE 09/08/2019 2226   LEUKOCYTESUR NEGATIVE 09/08/2019 2226    Radiological Exams on Admission: DG Chest 2 View  Result Date: 09/15/2020 CLINICAL DATA:  Chest pain.  Sickle cell pain.  Pain onset today. EXAM: CHEST - 2 VIEW COMPARISON:  Radiograph 07/24/2020 FINDINGS: Improved left basilar opacity with mild residual scarring. Resolved opacity at the right lung base. There may be tiny bilateral pleural effusions. Upper normal heart size with normal mediastinal contours. No pulmonary edema. No pneumothorax. Osseous sequela of sickle cell. Presumed external artifact projecting over the right anterior chest. IMPRESSION: Improved bibasilar opacities since 07/24/2020 with mild residual left basilar scarring. Possible tiny pleural effusions. Electronically Signed   By: Narda Rutherford M.D.   On: 09/15/2020 14:55   CT Angio Chest PE W and/or Wo Contrast  Result Date: 09/15/2020 CLINICAL DATA:  Sickle cell anemia. Concern for pulmonary embolism chest pain and vomiting. EXAM: CT ANGIOGRAPHY CHEST WITH  CONTRAST TECHNIQUE: Multidetector CT imaging of the chest was performed using the standard protocol during bolus administration of intravenous contrast. Multiplanar CT image reconstructions and MIPs were obtained to evaluate the vascular anatomy. CONTRAST:  OMNIPAQUE IOHEXOL 350 MG/ML SOLN COMPARISON:  None. FINDINGS: Cardiovascular: No filling defects within the pulmonary arteries to suggest acute pulmonary embolism. Mediastinum/Nodes: No axillary or supraclavicular adenopathy. No mediastinal or hilar adenopathy. No pericardial fluid. Esophagus normal. Lungs/Pleura: No pneumonia.  No pleural fluid. Upper Abdomen: Auto infarcted spleen. Musculoskeletal: Diffuse sclerosis the bones. Biconcave vertebral body endplates Review of the MIP images confirms the above findings. IMPRESSION: 1. No evidence acute pulmonary embolism. 2. No pneumonia or evidence of acute chest syndrome. 3. Osseous sequelae sickle cell disease noted. Electronically Signed   By: Genevive Bi M.D.   On: 09/15/2020 18:23    EKG: Independently reviewed.  Assessment/Plan Principal Problem:   Sickle cell pain crisis (HCC) Active Problems:   Chronic pain syndrome    1. Sickle cell pain crisis -  1. Sickle cell pathway 2. Scheduled toradol 3. IVF 4. Dilaudid PCA 5. Cont hydrea  DVT prophylaxis: Lovenox Code Status: Full Family Communication: No family in room Disposition Plan: Home after admit Consults called: None Admission status: Admit to inpatient  Severity of Illness: The appropriate patient status for this patient is INPATIENT. Inpatient status is judged to be reasonable and necessary in order to provide the required intensity of service to ensure the patient's safety. The patient's presenting symptoms, physical exam findings, and initial radiographic and laboratory data in the context of their chronic comorbidities is felt to place them at high risk for further clinical deterioration. Furthermore, it is not  anticipated that the patient will be medically stable for discharge from the hospital within 2 midnights of admission. The following factors support the patient status of inpatient.   IP status for dilaudid PCA treatment of sickle cell crisis.   * I certify that at the point of admission it is my clinical judgment that the patient will require inpatient hospital care spanning beyond 2 midnights from the point of admission due to high intensity of service, high risk for further deterioration and high frequency of surveillance required.*    Ivianna Notch M. DO [facility]  How to contact the [facility] Attending or Consulting provider 7A - 7P or covering provider during after hours 7P -7A, for this patient?  1. Check the care team in [facility] and look for a) attending/consulting [facility] provider listed and b) the [facility] team listed 2. Log into [email or web address]  Amion Physician Scheduling and messaging for groups and whole hospitals  On call and physician scheduling software for group practices, residents, hospitalists and other medical providers for call, clinic, rotation and shift schedules. On[employer] is a hospital-wide system for scheduling doctors and paging doctors on call. EasyPlot is for scientific plotting and data analysis.  [email or web address]  and use 's universal password to access. If you do not have the password, please contact the hospital operator.  3. Locate the [facility] provider you are looking for under [facility] and page to a number that you can be directly reached. 4. If you still have difficulty reaching the provider, please page the [facility] (Director on Call) for the Hospitalists listed on amion for assistance.  09/15/2020, 8:07 PM

## 2020-09-15 NOTE — Progress Notes (Signed)
Patient had mild itching after injection of IV contrast for CT angio chest.  Patient had received IV benadryl prior to scan for another reason.

## 2020-09-16 DIAGNOSIS — G894 Chronic pain syndrome: Secondary | ICD-10-CM | POA: Diagnosis not present

## 2020-09-16 DIAGNOSIS — D5700 Hb-SS disease with crisis, unspecified: Principal | ICD-10-CM

## 2020-09-16 LAB — HIV ANTIBODY (ROUTINE TESTING W REFLEX): HIV Screen 4th Generation wRfx: NONREACTIVE

## 2020-09-16 MED ORDER — OXYCODONE HCL 5 MG PO TABS: 10.0000 mg | ORAL_TABLET | ORAL | Status: DC | PRN

## 2020-09-16 MED ORDER — KETOROLAC TROMETHAMINE 30 MG/ML IJ SOLN
15.0000 mg | Freq: Four times a day (QID) | INTRAMUSCULAR | Status: DC
  Administered 2020-09-16 – 2020-09-17 (×4): 15 mg via INTRAVENOUS
  Filled 2020-09-16 (×4): qty 1

## 2020-09-16 MED ORDER — HYDROXYUREA 500 MG PO CAPS
1500.0000 mg | ORAL_CAPSULE | Freq: Every day | ORAL | Status: DC
  Administered 2020-09-16: 1500 mg via ORAL
  Filled 2020-09-16: qty 3

## 2020-09-16 NOTE — Progress Notes (Signed)
Patient ID: Matthew Frederick, male   DOB: 2001/03/04, 20 y.o.   MRN: [id number] Subjective: Matthew Frederick is a 20 year old male with a medical history significant for sickle cell disease, chronic pain syndrome, and anemia of chronic disease was admitted for sickle cell pain crisis.  Patient claims his pain is improved significantly overnight. He rates his pain at 3/10 but still want to stay for 1 more night in the hospital. Chest pain is resolved. No fever. He denies any headache, nausea, vomiting or diarrhea. No urinary symptoms.  Objective:  Vital signs in last 24 hours:  Vitals:   09/16/20 0502 09/16/20 0858 09/16/20 0947 09/16/20 1206  BP:   97/64   Pulse:   62   Resp: 10 18 14 16   Temp:   97.6 F (36.4 C)   TempSrc:   Oral   SpO2: 97% 98% 100% 98%  Weight:      Height:        Intake/Output from previous day:   Intake/Output Summary (Last 24 hours) at 09/16/2020 1247 Last data filed at 09/15/2020 1628 Gross per 24 hour  Intake 1015.65 ml  Output --  Net 1015.65 ml   Physical Exam: General: Alert, awake, oriented x3, in no acute distress.  Comfortable at rest HEENT: [place]/AT PEERL, EOMI Neck: Trachea midline,  no masses, no thyromegal,y no JVD, no carotid bruit OROPHARYNX:  Moist, No exudate/ erythema/lesions.  Heart: Regular rate and rhythm, without murmurs, rubs, gallops, PMI non-displaced, no heaves or thrills on palpation.  Lungs: Clear to auscultation, no wheezing or rhonchi noted. No increased vocal fremitus resonant to percussion  Abdomen: Soft, nontender, nondistended, positive bowel sounds, no masses no hepatosplenomegaly noted..  Neuro: No focal neurological deficits noted cranial nerves II through XII grossly intact. DTRs 2+ bilaterally upper and lower extremities. Strength 5 out of 5 in bilateral upper and lower extremities. Musculoskeletal: No warm swelling or erythema around joints, no spinal tenderness noted. Psychiatric: Patient alert and oriented x3, good  insight and cognition, good recent to remote recall. Lymph node survey: No cervical axillary or inguinal lymphadenopathy noted.  Lab Results:  Basic Metabolic Panel:    Component Value Date/Time   NA 139 09/15/2020 1435   K 3.2 (L) 09/15/2020 1435   CL 107 09/15/2020 1435   CO2 24 09/15/2020 1435   BUN 9 09/15/2020 1435   CREATININE 0.67 09/15/2020 1435   GLUCOSE 93 09/15/2020 1435   CALCIUM 8.7 (L) 09/15/2020 1435   CBC:    Component Value Date/Time   WBC 14.7 (H) 09/15/2020 1435   HGB 9.4 (L) 09/15/2020 1435   HCT 25.9 (L) 09/15/2020 1435   PLT 436 (H) 09/15/2020 1435   MCV 89.0 09/15/2020 1435   NEUTROABS 9.6 (H) 09/15/2020 1435   LYMPHSABS 2.6 09/15/2020 1435   MONOABS 2.1 (H) 09/15/2020 1435   EOSABS 0.2 09/15/2020 1435   BASOSABS 0.1 09/15/2020 1435    Recent Results (from the past 240 hour(s))  Resp Panel by RT-PCR (Flu A&B, Covid) Nasopharyngeal Swab     Status: None   Collection Time: 09/15/20  6:45 PM   Specimen: Nasopharyngeal Swab; Nasopharyngeal(NP) swabs in vial transport medium  Result Value Ref Range Status   SARS Coronavirus 2 by RT PCR NEGATIVE NEGATIVE Final    Comment: (NOTE) SARS-CoV-2 target nucleic acids are NOT DETECTED.  The SARS-CoV-2 RNA is generally detectable in upper respiratory specimens during the acute phase of infection. The lowest concentration of SARS-CoV-2 viral copies this assay can  detect is 138 copies/mL. A negative result does not preclude SARS-Cov-2 infection and should not be used as the sole basis for treatment or other patient management decisions. A negative result may occur with  improper specimen collection/handling, submission of specimen other than nasopharyngeal swab, presence of viral mutation(s) within the areas targeted by this assay, and inadequate number of viral copies(<138 copies/mL). A negative result must be combined with clinical observations, patient history, and epidemiological information. The expected  result is Negative.  Fact Sheet for Patients:  BloggerCourse.com  Fact Sheet for Healthcare Providers:  SeriousBroker.it  This test is no t yet approved or cleared by the [place] FDA and  has been authorized for detection and/or diagnosis of SARS-CoV-2 by FDA under an Emergency Use Authorization (EUA). This EUA will remain  in effect (meaning this test can be used) for the duration of the COVID-19 declaration under Section 564(b)(1) of the Act, 21 U.S.C.section 360bbb-3(b)(1), unless the authorization is terminated  or revoked sooner.       Influenza A by PCR NEGATIVE NEGATIVE Final   Influenza B by PCR NEGATIVE NEGATIVE Final    Comment: (NOTE) The Xpert Xpress SARS-CoV-2/FLU/RSV plus assay is intended as an aid in the diagnosis of influenza from Nasopharyngeal swab specimens and should not be used as a sole basis for treatment. Nasal washings and aspirates are unacceptable for Xpert Xpress SARS-CoV-2/FLU/RSV testing.  Fact Sheet for Patients: BloggerCourse.com  Fact Sheet for Healthcare Providers: SeriousBroker.it  This test is not yet approved or cleared by the [place] FDA and has been authorized for detection and/or diagnosis of SARS-CoV-2 by FDA under an Emergency Use Authorization (EUA). This EUA will remain in effect (meaning this test can be used) for the duration of the COVID-19 declaration under Section 564(b)(1) of the Act, 21 U.S.C. section 360bbb-3(b)(1), unless the authorization is terminated or revoked.  Performed at [facility], [address], [place], [place] [address]     Studies/Results: DG Chest 2 View  Result Date: 09/15/2020 CLINICAL DATA:  Chest pain.  Sickle cell pain.  Pain onset today. EXAM: CHEST - 2 VIEW COMPARISON:  Radiograph 07/24/2020 FINDINGS: Improved left basilar opacity with mild residual scarring.  Resolved opacity at the right lung base. There may be tiny bilateral pleural effusions. Upper normal heart size with normal mediastinal contours. No pulmonary edema. No pneumothorax. Osseous sequela of sickle cell. Presumed external artifact projecting over the right anterior chest. IMPRESSION: Improved bibasilar opacities since 07/24/2020 with mild residual left basilar scarring. Possible tiny pleural effusions. Electronically Signed   By: Narda Rutherford M.D.   On: 09/15/2020 14:55   CT Angio Chest PE W and/or Wo Contrast  Result Date: 09/15/2020 CLINICAL DATA:  Sickle cell anemia. Concern for pulmonary embolism chest pain and vomiting. EXAM: CT ANGIOGRAPHY CHEST WITH CONTRAST TECHNIQUE: Multidetector CT imaging of the chest was performed using the standard protocol during bolus administration of intravenous contrast. Multiplanar CT image reconstructions and MIPs were obtained to evaluate the vascular anatomy. CONTRAST:  OMNIPAQUE IOHEXOL 350 MG/ML SOLN COMPARISON:  None. FINDINGS: Cardiovascular: No filling defects within the pulmonary arteries to suggest acute pulmonary embolism. Mediastinum/Nodes: No axillary or supraclavicular adenopathy. No mediastinal or hilar adenopathy. No pericardial fluid. Esophagus normal. Lungs/Pleura: No pneumonia.  No pleural fluid. Upper Abdomen: Auto infarcted spleen. Musculoskeletal: Diffuse sclerosis the bones. Biconcave vertebral body endplates Review of the MIP images confirms the above findings. IMPRESSION: 1. No evidence acute pulmonary embolism. 2. No pneumonia or evidence of  acute chest syndrome. 3. Osseous sequelae sickle cell disease noted. Electronically Signed   By: Genevive Bi M.D.   On: 09/15/2020 18:23    Medications: Scheduled Meds: . enoxaparin (LOVENOX) injection  40 mg Subcutaneous Q24H  . folic acid  1 mg Oral QHS  . HYDROmorphone   Intravenous Q4H  . hydroxyurea  1,500 mg Oral QHS  . ketorolac  15 mg Intravenous Q6H  . senna-docusate  1  tablet Oral BID   Continuous Infusions: . sodium chloride 75 mL/hr at 09/15/20 2338   PRN Meds:.acetaminophen, diphenhydrAMINE **OR** [DISCONTINUED] diphenhydrAMINE, naloxone **AND** sodium chloride flush, ondansetron (ZOFRAN) IV, polyethylene glycol  Consultants:  None  Procedures:  None  Antibiotics:  None  Assessment/Plan: Principal Problem:   Sickle cell pain crisis (HCC) Active Problems:   Chronic pain syndrome  1. Hb Sickle Cell Disease with crisis: Discontinue telemetry.  Reduce IVF rate to 50 mls/hour, continue weight based Dilaudid PCA at current setting, continue IV Toradol 15 mg Q 6 H for total of 5 days, restart and continue oral home pain medications.  Monitor vitals very closely, Re-evaluate pain scale regularly, 2 L of Oxygen by [place]. 2. Leukocytosis: Most likely reactive. Patient has no fever no other signs or symptoms of inflammation or infection. We will continue to observe and manage without antibiotics. Repeat labs in a.m. 3. Sickle Cell Anemia: Hemoglobin is stable at baseline. No clinical indication for blood transfusion today. We will continue to monitor closely.   4. Chronic pain Syndrome: Restart and continue home pain medications.  Code Status: Full Code Family Communication: N/A Disposition Plan: For possible discharge home tomorrow.  Miraya Cudney  If 7PM-7AM, please contact night-coverage.  09/16/2020, 12:47 PM  LOS: 1 day

## 2020-09-17 DIAGNOSIS — D5700 Hb-SS disease with crisis, unspecified: Secondary | ICD-10-CM | POA: Diagnosis not present

## 2020-09-17 LAB — BASIC METABOLIC PANEL
Anion gap: 8 (ref 5–15)
BUN: 8 mg/dL (ref 6–20)
CO2: 28 mmol/L (ref 22–32)
Calcium: 8.8 mg/dL — ABNORMAL LOW (ref 8.9–10.3)
Chloride: 103 mmol/L (ref 98–111)
Creatinine, Ser: 0.59 mg/dL — ABNORMAL LOW (ref 0.61–1.24)
GFR, Estimated: >60 mL/min (ref >60)
Glucose, Bld: 95 mg/dL (ref 70–99)
Potassium: 4 mmol/L (ref 3.5–5.1)
Sodium: 139 mmol/L (ref 135–145)

## 2020-09-17 LAB — CBC WITH DIFFERENTIAL/PLATELET
Abs Immature Granulocytes: 0.05 10*3/uL (ref 0.00–0.07)
Basophils Absolute: 0.1 10*3/uL (ref 0.0–0.1)
Basophils Relative: 1 %
Eosinophils Absolute: 0.2 10*3/uL (ref 0.0–0.5)
Eosinophils Relative: 2 %
HCT: 23.8 % — ABNORMAL LOW (ref 39.0–52.0)
Hemoglobin: 8.7 g/dL — ABNORMAL LOW (ref 13.0–17.0)
Immature Granulocytes: 1 %
Lymphocytes Relative: 27 %
Lymphs Abs: 2.7 10*3/uL (ref 0.7–4.0)
MCH: 33 pg (ref 26.0–34.0)
MCHC: 36.6 g/dL — ABNORMAL HIGH (ref 30.0–36.0)
MCV: 90.2 fL (ref 80.0–100.0)
Monocytes Absolute: 1.4 10*3/uL — ABNORMAL HIGH (ref 0.1–1.0)
Monocytes Relative: 14 %
Neutro Abs: 5.8 10*3/uL (ref 1.7–7.7)
Neutrophils Relative %: 55 %
Platelets: 398 10*3/uL (ref 150–400)
RBC: 2.64 MIL/uL — ABNORMAL LOW (ref 4.22–5.81)
RDW: 20.4 % — ABNORMAL HIGH (ref 11.5–15.5)
WBC: 10.2 10*3/uL (ref 4.0–10.5)
nRBC: 3.5 % — ABNORMAL HIGH (ref 0.0–0.2)

## 2020-09-17 NOTE — Discharge Summary (Signed)
Physician Discharge Summary  Matthew Frederick JIR:[id number] DOB: 18-Jan-2001 DOA: 09/15/2020  PCP: [name], MD  Admit date: 09/15/2020  Discharge date: 09/18/2020  Discharge Diagnoses:  Principal Problem:   Sickle cell pain crisis [facility]) Active Problems:   Chronic pain syndrome   Discharge Condition: Stable  Disposition:  Pt is discharged home in good condition and is to follow up with [name], MD this week to have labs evaluated. [facility] is instructed to increase activity slowly and balance with rest for the next few days, and use prescribed medication to complete treatment of pain  Diet: Regular Wt Readings from Last 3 Encounters:  09/15/20 62.6 kg (22 %, Z= -0.78)*  08/13/20 59 kg (11 %, Z= -1.20)*  07/11/20 58.3 kg (10 %, Z= -1.28)*   * Growth percentiles are based on CDC (Boys, 2-20 Years) data.    History of present illness:  Matthew Frederick is a 20 year old male with a medical history significant for sickle cell disease presented to the ER with complaints of chest pain.  Onset of chest pain was this a.m.  Patient also had pain all over that is consistent with his past sickle cell pain crisis.  Symptoms characterized as constant, severe, and not improved with home pain medications.  He denies any fevers, chills, abdominal pain, nausea, or vomiting.  ER course: Pain persisted despite multiple rounds of IV Dilaudid.  CTA of chest negative for PE or any other acute findings.  Hospital Course:  Sickle cell disease with pain crisis: Patient was admitted for sickle cell pain crisis and managed appropriately with IVF, IV Dilaudid via PCA and IV Toradol, as well as other adjunct therapies per sickle cell pain management protocols.  Patient states the pain intensity has improved.  He rates pain as 2/10.  Patient advised to resume home medications.  Also, will follow-up with PCP in 1 week to repeat CBC with differential and CMP. Patient is alert, oriented, and  ambulating without assistance.    Patient was therefore discharged home today in a hemodynamically stable condition.   Matthew Frederick will follow-up with PCP within 1 week of this discharge. Matthew Frederick was counseled extensively about nonpharmacologic means of pain management, patient verbalized understanding and was appreciative of  the care received during this admission.   We discussed the need for good hydration, monitoring of hydration status, avoidance of heat, cold, stress, and infection triggers. We discussed the need to be adherent with taking Hydrea and other home medications. Patient was reminded of the need to seek medical attention immediately if any symptom of bleeding, anemia, or infection occurs.  Discharge Exam: Vitals:   09/17/20 1011 09/17/20 1226  BP: 109/62   Pulse: 87   Resp: 15 12  Temp: 98.5 F (36.9 C)   SpO2: 93% 94%   Vitals:   09/17/20 0603 09/17/20 0729 09/17/20 1011 09/17/20 1226  BP: (!) 112/59  109/62   Pulse: 69  87   Resp: 16 16 15 12   Temp: 97.8 F (36.6 C)  98.5 F (36.9 C)   TempSrc: Oral  Oral   SpO2: 91% 94% 93% 94%  Weight:      Height:        General appearance : Awake, alert, not in any distress. Speech Clear. Not toxic looking HEENT: Atraumatic and Normocephalic, pupils equally reactive to light and accomodation Neck: Supple, no JVD. No cervical lymphadenopathy.  Chest: Good air entry bilaterally, no added sounds  CVS: S1 S2 regular, no murmurs.  Abdomen: Bowel sounds present, Non tender and not distended with no gaurding, rigidity or rebound. Extremities: B/L Lower Ext shows no edema, both legs are warm to touch Neurology: Awake alert, and oriented X 3, CN II-XII intact, Non focal Skin: No Rash  Discharge Instructions  Discharge Instructions    Discharge patient   Complete by: As directed    Discharge disposition: 01-Home or Self Care   Discharge patient date: 09/17/2020     Allergies as of 09/17/2020      Reactions   Omnipaque [iohexol]  Itching      Medication List    TAKE these medications   acetaminophen 500 MG tablet Commonly known as: TYLENOL Take 2 tablets (1,000 mg total) by mouth every 6 (six) hours as needed for headache.   folic acid 1 MG tablet Commonly known as: FOLVITE Take 1 mg by mouth at bedtime.   hydroxyurea 500 MG capsule Commonly known as: HYDREA Take 1,500 mg by mouth at bedtime.   Oxycodone HCl 10 MG Tabs Take 1 tablet (10 mg total) by mouth every 4 (four) hours as needed.       The results of significant diagnostics from this hospitalization (including imaging, microbiology, ancillary and laboratory) are listed below for reference.    Significant Diagnostic Studies: DG Chest 2 View  Result Date: 09/15/2020 CLINICAL DATA:  Chest pain.  Sickle cell pain.  Pain onset today. EXAM: CHEST - 2 VIEW COMPARISON:  Radiograph 07/24/2020 FINDINGS: Improved left basilar opacity with mild residual scarring. Resolved opacity at the right lung base. There may be tiny bilateral pleural effusions. Upper normal heart size with normal mediastinal contours. No pulmonary edema. No pneumothorax. Osseous sequela of sickle cell. Presumed external artifact projecting over the right anterior chest. IMPRESSION: Improved bibasilar opacities since 07/24/2020 with mild residual left basilar scarring. Possible tiny pleural effusions. Electronically Signed   By: Narda Rutherford M.D.   On: 09/15/2020 14:55   CT Angio Chest PE W and/or Wo Contrast  Result Date: 09/15/2020 CLINICAL DATA:  Sickle cell anemia. Concern for pulmonary embolism chest pain and vomiting. EXAM: CT ANGIOGRAPHY CHEST WITH CONTRAST TECHNIQUE: Multidetector CT imaging of the chest was performed using the standard protocol during bolus administration of intravenous contrast. Multiplanar CT image reconstructions and MIPs were obtained to evaluate the vascular anatomy. CONTRAST:  OMNIPAQUE IOHEXOL 350 MG/ML SOLN COMPARISON:  None. FINDINGS: Cardiovascular:  No filling defects within the pulmonary arteries to suggest acute pulmonary embolism. Mediastinum/Nodes: No axillary or supraclavicular adenopathy. No mediastinal or hilar adenopathy. No pericardial fluid. Esophagus normal. Lungs/Pleura: No pneumonia.  No pleural fluid. Upper Abdomen: Auto infarcted spleen. Musculoskeletal: Diffuse sclerosis the bones. Biconcave vertebral body endplates Review of the MIP images confirms the above findings. IMPRESSION: 1. No evidence acute pulmonary embolism. 2. No pneumonia or evidence of acute chest syndrome. 3. Osseous sequelae sickle cell disease noted. Electronically Signed   By: Genevive Bi M.D.   On: 09/15/2020 18:23    Microbiology: Recent Results (from the past 240 hour(s))  Resp Panel by RT-PCR (Flu A&B, Covid) Nasopharyngeal Swab     Status: None   Collection Time: 09/15/20  6:45 PM   Specimen: Nasopharyngeal Swab; Nasopharyngeal(NP) swabs in vial transport medium  Result Value Ref Range Status   SARS Coronavirus 2 by RT PCR NEGATIVE NEGATIVE Final    Comment: (NOTE) SARS-CoV-2 target nucleic acids are NOT DETECTED.  The SARS-CoV-2 RNA is generally detectable in upper respiratory specimens during the acute phase of infection. The lowest  concentration of SARS-CoV-2 viral copies this assay can detect is 138 copies/mL. A negative result does not preclude SARS-Cov-2 infection and should not be used as the sole basis for treatment or other patient management decisions. A negative result may occur with  improper specimen collection/handling, submission of specimen other than nasopharyngeal swab, presence of viral mutation(s) within the areas targeted by this assay, and inadequate number of viral copies(<138 copies/mL). A negative result must be combined with clinical observations, patient history, and epidemiological information. The expected result is Negative.  Fact Sheet for Patients:  BloggerCourse.com  Fact Sheet for  Healthcare Providers:  SeriousBroker.it  This test is no t yet approved or cleared by the [place] FDA and  has been authorized for detection and/or diagnosis of SARS-CoV-2 by FDA under an Emergency Use Authorization (EUA). This EUA will remain  in effect (meaning this test can be used) for the duration of the COVID-19 declaration under Section 564(b)(1) of the Act, 21 U.S.C.section 360bbb-3(b)(1), unless the authorization is terminated  or revoked sooner.       Influenza A by PCR NEGATIVE NEGATIVE Final   Influenza B by PCR NEGATIVE NEGATIVE Final    Comment: (NOTE) The Xpert Xpress SARS-CoV-2/FLU/RSV plus assay is intended as an aid in the diagnosis of influenza from Nasopharyngeal swab specimens and should not be used as a sole basis for treatment. Nasal washings and aspirates are unacceptable for Xpert Xpress SARS-CoV-2/FLU/RSV testing.  Fact Sheet for Patients: BloggerCourse.com  Fact Sheet for Healthcare Providers: SeriousBroker.it  This test is not yet approved or cleared by the [place] FDA and has been authorized for detection and/or diagnosis of SARS-CoV-2 by FDA under an Emergency Use Authorization (EUA). This EUA will remain in effect (meaning this test can be used) for the duration of the COVID-19 declaration under Section 564(b)(1) of the Act, 21 U.S.C. section 360bbb-3(b)(1), unless the authorization is terminated or revoked.  Performed at [facility], [address], [place], [place] [address]      Labs: Basic Metabolic Panel: Recent Labs  Lab 09/15/20 1435 09/17/20 0524  NA 139 139  K 3.2* 4.0  CL 107 103  CO2 24 28  GLUCOSE 93 95  BUN 9 8  CREATININE 0.67 0.59*  CALCIUM 8.7* 8.8*   Liver Function Tests: Recent Labs  Lab 09/15/20 1435  AST 25  ALT 10  ALKPHOS 80  BILITOT 5.5*  PROT 7.1  ALBUMIN 4.0   No results for input(s):  LIPASE, AMYLASE in the last 168 hours. No results for input(s): AMMONIA in the last 168 hours. CBC: Recent Labs  Lab 09/15/20 1435 09/17/20 0524  WBC 14.7* 10.2  NEUTROABS 9.6* 5.8  HGB 9.4* 8.7*  HCT 25.9* 23.8*  MCV 89.0 90.2  PLT 436* 398   Cardiac Enzymes: No results for input(s): CKTOTAL, CKMB, CKMBINDEX, TROPONINI in the last 168 hours. BNP: Invalid input(s): POCBNP CBG: No results for input(s): GLUCAP in the last 168 hours.  Time coordinating discharge: 35 minutes  Signed:  [name]  APRN, MSN, FNP-C Patient Care [facility] [address] [place], [place] [address] [phone]  [facility] 09/18/2020, 5:12 PM

## 2020-09-17 NOTE — Progress Notes (Signed)
AVS reviewed with patient. Pt left unit in stable condition and with all belongings. Mother providing transport home.

## 2020-09-18 ENCOUNTER — Telehealth: Payer: Self-pay

## 2020-09-18 NOTE — Telephone Encounter (Signed)
Transition Care Management Follow-up Telephone Call  Date of discharge and from where: 09/17/2020 From [facility]  How have you been since you were released from the hospital? Pt states that he is feeling a little bit better. Pt is still in pain but it is not as severe as it was prior to admission.   Any questions or concerns? No  Items Reviewed:  Did the pt receive and understand the discharge instructions provided? Yes   Medications obtained and verified? Yes   Other? No   Any new allergies since your discharge? No   Dietary orders reviewed? n/a  Do you have support at home? Yes   Functional Questionnaire: (I = Independent and D = Dependent) ADLs: I  Bathing/Dressing- I  Meal Prep- I  Eating- I  Maintaining continence- I  Transferring/Ambulation- I  Managing Meds- I   Follow up appointments reviewed:   PCP Hospital f/u appt confirmed? No   Specialist Hospital f/u appt confirmed? Yes  Scheduled to see Dr. Sherryll Burger on 11/05/2020 @ 01:00pm.[facility]    Are transportation arrangements needed? No   If their condition worsens, is the pt aware to call PCP or go to the Emergency Dept.? Yes  Was the patient provided with contact information for the PCP's office or ED? Yes  Was to pt encouraged to call back with questions or concerns? Yes

## 2020-10-02 ENCOUNTER — Emergency Department (HOSPITAL_COMMUNITY)
Admission: EM | Admit: 2020-10-02 | Discharge: 2020-10-02 | Disposition: A | Discharge status: Home / Self Care | Payer: Medicare Other | Attending: Emergency Medicine | Admitting: Emergency Medicine

## 2020-10-02 ENCOUNTER — Emergency Department (HOSPITAL_COMMUNITY): Payer: Medicare Other

## 2020-10-02 ENCOUNTER — Other Ambulatory Visit: Payer: Self-pay

## 2020-10-02 ENCOUNTER — Encounter (HOSPITAL_COMMUNITY): Payer: Self-pay

## 2020-10-02 DIAGNOSIS — D5700 Hb-SS disease with crisis, unspecified: Secondary | ICD-10-CM | POA: Insufficient documentation

## 2020-10-02 LAB — CBC WITH DIFFERENTIAL/PLATELET
Abs Immature Granulocytes: 0.06 10*3/uL (ref 0.00–0.07)
Basophils Absolute: 0.1 10*3/uL (ref 0.0–0.1)
Basophils Relative: 1 %
Eosinophils Absolute: 0.1 10*3/uL (ref 0.0–0.5)
Eosinophils Relative: 1 %
HCT: 28.8 % — ABNORMAL LOW (ref 39.0–52.0)
Hemoglobin: 10.2 g/dL — ABNORMAL LOW (ref 13.0–17.0)
Immature Granulocytes: 0 %
Lymphocytes Relative: 17 %
Lymphs Abs: 2.7 10*3/uL (ref 0.7–4.0)
MCH: 31.9 pg (ref 26.0–34.0)
MCHC: 35.4 g/dL (ref 30.0–36.0)
MCV: 90 fL (ref 80.0–100.0)
Monocytes Absolute: 2.4 10*3/uL — ABNORMAL HIGH (ref 0.1–1.0)
Monocytes Relative: 15 %
Neutro Abs: 10.2 10*3/uL — ABNORMAL HIGH (ref 1.7–7.7)
Neutrophils Relative %: 66 %
Platelets: 588 10*3/uL — ABNORMAL HIGH (ref 150–400)
RBC: 3.2 MIL/uL — ABNORMAL LOW (ref 4.22–5.81)
RDW: 19.6 % — ABNORMAL HIGH (ref 11.5–15.5)
WBC: 15.6 10*3/uL — ABNORMAL HIGH (ref 4.0–10.5)
nRBC: 1.9 % — ABNORMAL HIGH (ref 0.0–0.2)

## 2020-10-02 LAB — RETICULOCYTES
Immature Retic Fract: 32.8 % — ABNORMAL HIGH (ref 2.3–15.9)
RBC.: 3.18 MIL/uL — ABNORMAL LOW (ref 4.22–5.81)
Retic Count, Absolute: 472.2 10*3/uL — ABNORMAL HIGH (ref 19.0–186.0)
Retic Ct Pct: 14.9 % — ABNORMAL HIGH (ref 0.4–3.1)

## 2020-10-02 LAB — COMPREHENSIVE METABOLIC PANEL
ALT: 11 U/L (ref 0–44)
AST: 23 U/L (ref 15–41)
Albumin: 4.4 g/dL (ref 3.5–5.0)
Alkaline Phosphatase: 107 U/L (ref 38–126)
Anion gap: 10 (ref 5–15)
BUN: 8 mg/dL (ref 6–20)
CO2: 25 mmol/L (ref 22–32)
Calcium: 9.5 mg/dL (ref 8.9–10.3)
Chloride: 106 mmol/L (ref 98–111)
Creatinine, Ser: 0.86 mg/dL (ref 0.61–1.24)
GFR, Estimated: >60 mL/min (ref >60)
Glucose, Bld: 94 mg/dL (ref 70–99)
Potassium: 4 mmol/L (ref 3.5–5.1)
Sodium: 141 mmol/L (ref 135–145)
Total Bilirubin: 6.2 mg/dL — ABNORMAL HIGH (ref 0.3–1.2)
Total Protein: 8.2 g/dL — ABNORMAL HIGH (ref 6.5–8.1)

## 2020-10-02 MED ORDER — OXYCODONE HCL 10 MG PO TABS: 10.0000 mg | ORAL_TABLET | ORAL | 0 refills | Status: AC | PRN

## 2020-10-02 MED ORDER — KETOROLAC TROMETHAMINE 10 MG PO TABS: 10.0000 mg | ORAL_TABLET | Freq: Four times a day (QID) | ORAL | 0 refills | Status: DC | PRN

## 2020-10-02 MED ORDER — HYDROMORPHONE HCL 1 MG/ML IJ SOLN
1.0000 mg | Freq: Once | INTRAMUSCULAR | Status: AC
  Administered 2020-10-02: 1 mg via INTRAVENOUS
  Filled 2020-10-02: qty 1

## 2020-10-02 MED ORDER — HYDROMORPHONE HCL 1 MG/ML IJ SOLN
1.0000 mg | Freq: Once | INTRAMUSCULAR | Status: AC
Start: 2020-10-02 — End: 2020-10-02
  Administered 2020-10-02: 1 mg via INTRAVENOUS
  Filled 2020-10-02: qty 1

## 2020-10-02 MED ORDER — DIPHENHYDRAMINE HCL 50 MG/ML IJ SOLN
25.0000 mg | Freq: Once | INTRAMUSCULAR | Status: AC
  Administered 2020-10-02: 25 mg via INTRAVENOUS
  Filled 2020-10-02: qty 1

## 2020-10-02 MED ORDER — KETOROLAC TROMETHAMINE 30 MG/ML IJ SOLN
30.0000 mg | Freq: Once | INTRAMUSCULAR | Status: AC
  Administered 2020-10-02: 30 mg via INTRAVENOUS
  Filled 2020-10-02: qty 1

## 2020-10-02 NOTE — ED Triage Notes (Signed)
Patient c/o sickle cell pain of the left knee since yesterday.

## 2020-10-02 NOTE — ED Provider Notes (Signed)
[facility]-EMERGENCY DEPT Provider Note   CSN: [id number] Arrival date & time: 10/02/20  1059     History Chief Complaint  Patient presents with  . Sickle Cell Pain Crisis    Matthew Frederick is a 20 y.o. male.  The history is provided by the patient.  Sickle Cell Pain Crisis Location:  Lower extremity (left knee) Severity:  Severe Onset quality:  Sudden Duration:  1 day Similar to previous crisis episodes: yes   Timing:  Constant Progression:  Unchanged Chronicity:  New Sickle cell genotype:  SS History of pulmonary emboli: no   Context: not change in medication and not infection   Relieved by:  Nothing Worsened by:  Nothing Ineffective treatments: oxycodone 10 mg. Associated symptoms: no chest pain, no cough, no fever, no shortness of breath, no sore throat, no swelling of legs, no vision change and no vomiting        Past Medical History:  Diagnosis Date  . Sickle cell anemia [facility])     Patient Active Problem List   Diagnosis Date Noted  . Acute intractable headache 05/28/2020  . Chronic pain syndrome 05/28/2020  . Sickle cell pain crisis (HCC) 01/21/2020  . Anemia   . Sickle-cell crisis (HCC) 09/03/2019  . Sickle cell anemia with pain (HCC) 08/05/2019  . Abnormal liver function 04/02/2019  . Thrombocytopenia (HCC) 11/24/2018  . Leukocytosis 11/22/2018  . Chest pain   . Coccyx pain   . Vasoocclusive sickle cell crisis (HCC)   . Fever   . Sickle cell crisis (HCC) 07/23/2016  . Transition of care performed with sharing of clinical summary 04/28/2016  . Need for immunization against influenza 04/22/2012    History reviewed. No pertinent surgical history.     Family History  Problem Relation Age of Onset  . Sickle cell anemia Father   . Diabetes Maternal Grandmother     Social History   Tobacco Use  . Smoking status: Never Smoker  . Smokeless tobacco: Never Used  Vaping Use  . Vaping Use: Never used  Substance Use  Topics  . Alcohol use: No  . Drug use: No    Home Medications Prior to Admission medications   Medication Sig Start Date End Date Taking? Authorizing Provider  acetaminophen (TYLENOL) 500 MG tablet Take 2 tablets (1,000 mg total) by mouth every 6 (six) hours as needed for headache. 06/01/20   Massie Maroon, FNP  folic acid (FOLVITE) 1 MG tablet Take 1 mg by mouth at bedtime.     [provider]  hydroxyurea (HYDREA) 500 MG capsule Take 1,500 mg by mouth at bedtime.     [provider]  Oxycodone HCl 10 MG TABS Take 1 tablet (10 mg total) by mouth every 4 (four) hours as needed. 06/01/20   Massie Maroon, FNP    Allergies    Omnipaque [iohexol]  Review of Systems   Review of Systems  Constitutional: Negative for chills and fever.  HENT: Negative for ear pain and sore throat.   Eyes: Negative for pain and visual disturbance.  Respiratory: Negative for cough and shortness of breath.   Cardiovascular: Negative for chest pain and palpitations.  Gastrointestinal: Negative for abdominal pain and vomiting.  Genitourinary: Negative for dysuria and hematuria.  Musculoskeletal: Negative for arthralgias and back pain.  Skin: Negative for color change and rash.  Neurological: Negative for seizures and syncope.  All other systems reviewed and are negative.   Physical Exam Updated Vital Signs BP  119/83 (BP Location: Left Arm)   Pulse 69   Temp 98.2 F (36.8 C) (Oral)   Resp 11   Ht 5\' 10"  (1.778 m)   Wt 62.6 kg   SpO2 93%   BMI 19.80 kg/m   Physical Exam Vitals and nursing note reviewed.  Constitutional:      Appearance: He is well-developed.  HENT:     Head: Normocephalic and atraumatic.  Eyes:     Conjunctiva/sclera: Conjunctivae normal.  Cardiovascular:     Rate and Rhythm: Normal rate and regular rhythm.     Heart sounds: No murmur heard.   Pulmonary:     Effort: Pulmonary effort is normal. No respiratory distress.     Breath sounds: Normal  breath sounds.  Abdominal:     Palpations: Abdomen is soft.     Tenderness: There is no abdominal tenderness.  Musculoskeletal:     Cervical back: Neck supple.     Comments: Left knee is normal to inspection.  No effusion.  Normal range of motion.  Good distal pulses.  Skin:    General: Skin is warm and dry.     Capillary Refill: Capillary refill takes less than 2 seconds.  Neurological:     General: No focal deficit present.     Mental Status: He is alert.     ED Results / Procedures / Treatments   Labs (all labs ordered are listed, but only abnormal results are displayed) Labs Reviewed  COMPREHENSIVE METABOLIC PANEL - Abnormal; Notable for the following components:      Result Value   Total Protein 8.2 (*)    Total Bilirubin 6.2 (*)    All other components within normal limits  CBC WITH DIFFERENTIAL/PLATELET - Abnormal; Notable for the following components:   WBC 15.6 (*)    RBC 3.20 (*)    Hemoglobin 10.2 (*)    HCT 28.8 (*)    RDW 19.6 (*)    Platelets 588 (*)    nRBC 1.9 (*)    Neutro Abs 10.2 (*)    Monocytes Absolute 2.4 (*)    All other components within normal limits  RETICULOCYTES - Abnormal; Notable for the following components:   Retic Ct Pct 14.9 (*)    RBC. 3.18 (*)    Retic Count, Absolute 472.2 (*)    Immature Retic Fract 32.8 (*)    All other components within normal limits    EKG None  Radiology DG Knee Complete 4 Views Left  Result Date: 10/02/2020 CLINICAL DATA:  Left knee pain, sickle cell disease EXAM: LEFT KNEE - COMPLETE 4+ VIEW COMPARISON:  None. FINDINGS: Four view radiograph left knee demonstrates normal alignment. No fracture or dislocation. 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 Joint spaces are preserved. No effusion. Soft tissues are unremarkable. IMPRESSION: Osseous  changes in keeping with sickle cell disease. No acute fracture or dislocation. Electronically Signed   By: 10/04/2020 MD   On: 10/02/2020 12:20    Procedures Procedures   Medications Ordered in ED Medications  HYDROmorphone (DILAUDID) injection 1 mg (1 mg Intravenous Given 10/02/20 1205)  ketorolac (TORADOL) 30 MG/ML injection 30 mg (30 mg Intravenous Given 10/02/20 1202)  diphenhydrAMINE (BENADRYL) injection 25 mg (25 mg Intravenous Given 10/02/20 1213)  HYDROmorphone (DILAUDID) injection 1 mg (1 mg Intravenous Given 10/02/20 1455)  diphenhydrAMINE (BENADRYL) injection  25 mg (25 mg Intravenous Given 10/02/20 1453)    ED Course  I have reviewed the triage vital signs and the nursing notes.  Pertinent labs & imaging results that were available during my care of the patient were reviewed by me and considered in my medical decision making (see chart for details).    MDM Rules/Calculators/A&P                          The patient is 20 years old with a history of hemoglobin SS.  He presents with a sickle cell pain crisis with pain concentrated in his left knee.  He was given 2 doses of IV pain medication and had good response.  He did request a refill of his oxycodone.  He will be discharged home.  He was given careful return precautions and advised to seek follow-up care with his hematologist as well. Final Clinical Impression(s) / ED Diagnoses Final diagnoses:  Sickle cell pain crisis (HCC)    Rx / DC Orders ED Discharge Orders         Ordered    Oxycodone HCl 10 MG TABS  Every 4 hours PRN        10/02/20 1542    ketorolac (TORADOL) 10 MG tablet  Every 6 hours PRN        10/02/20 1542           Koleen Distance, MD 10/02/20 1550

## 2020-10-03 ENCOUNTER — Telehealth: Payer: Self-pay | Admitting: *Deleted

## 2020-10-03 NOTE — Telephone Encounter (Signed)
Transition Care Management Unsuccessful Follow-up Telephone Call  Date of discharge and from where:  10-02-20  [facility]  Attempts:  1st  Reason for unsuccessful TCM follow-up call:  No answer/ left message

## 2020-10-04 ENCOUNTER — Telehealth: Payer: Self-pay | Admitting: *Deleted

## 2020-10-04 NOTE — Telephone Encounter (Signed)
Transition Care Management Unsuccessful Follow-up Telephone Call  Date of discharge and from where:  10/02/2020 - [facility]  Attempts:  2nd Attempt  Reason for unsuccessful TCM follow-up call:  Left voice message

## 2020-10-05 NOTE — Telephone Encounter (Signed)
Transition Care Management Unsuccessful Follow-up Telephone Call  Date of discharge and from where:  10/02/2020 - [facility]  Attempts:  3rd Attempt  Reason for unsuccessful TCM follow-up call:  Left voice message

## 2020-10-26 ENCOUNTER — Inpatient Hospital Stay (HOSPITAL_COMMUNITY)
Admission: EM | Admit: 2020-10-26 | Discharge: 2020-10-30 | DRG: 812 | Disposition: A | Discharge status: Home / Self Care | Payer: Medicare Other | Attending: Internal Medicine | Admitting: Internal Medicine

## 2020-10-26 ENCOUNTER — Other Ambulatory Visit: Payer: Self-pay

## 2020-10-26 ENCOUNTER — Emergency Department (HOSPITAL_COMMUNITY): Payer: Medicare Other

## 2020-10-26 ENCOUNTER — Encounter (HOSPITAL_COMMUNITY): Payer: Self-pay | Admitting: Emergency Medicine

## 2020-10-26 DIAGNOSIS — G894 Chronic pain syndrome: Secondary | ICD-10-CM | POA: Diagnosis present

## 2020-10-26 DIAGNOSIS — D5700 Hb-SS disease with crisis, unspecified: Principal | ICD-10-CM | POA: Diagnosis present

## 2020-10-26 DIAGNOSIS — D638 Anemia in other chronic diseases classified elsewhere: Secondary | ICD-10-CM | POA: Diagnosis present

## 2020-10-26 DIAGNOSIS — Z832 Family history of diseases of the blood and blood-forming organs and certain disorders involving the immune mechanism: Secondary | ICD-10-CM

## 2020-10-26 DIAGNOSIS — Z888 Allergy status to other drugs, medicaments and biological substances status: Secondary | ICD-10-CM

## 2020-10-26 DIAGNOSIS — Z20822 Contact with and (suspected) exposure to covid-19: Secondary | ICD-10-CM | POA: Diagnosis present

## 2020-10-26 DIAGNOSIS — R509 Fever, unspecified: Secondary | ICD-10-CM | POA: Diagnosis not present

## 2020-10-26 DIAGNOSIS — D72829 Elevated white blood cell count, unspecified: Secondary | ICD-10-CM | POA: Diagnosis not present

## 2020-10-26 DIAGNOSIS — Z833 Family history of diabetes mellitus: Secondary | ICD-10-CM

## 2020-10-26 DIAGNOSIS — R079 Chest pain, unspecified: Secondary | ICD-10-CM

## 2020-10-26 DIAGNOSIS — R0789 Other chest pain: Secondary | ICD-10-CM

## 2020-10-26 DIAGNOSIS — E871 Hypo-osmolality and hyponatremia: Secondary | ICD-10-CM | POA: Diagnosis present

## 2020-10-26 DIAGNOSIS — Z79899 Other long term (current) drug therapy: Secondary | ICD-10-CM

## 2020-10-26 LAB — CBC WITH DIFFERENTIAL/PLATELET
Abs Immature Granulocytes: 0 10*3/uL (ref 0.00–0.07)
Band Neutrophils: 3 %
Basophils Absolute: 0.3 10*3/uL — ABNORMAL HIGH (ref 0.0–0.1)
Basophils Relative: 3 %
Eosinophils Absolute: 0.6 10*3/uL — ABNORMAL HIGH (ref 0.0–0.5)
Eosinophils Relative: 5 %
HCT: 26.7 % — ABNORMAL LOW (ref 39.0–52.0)
Hemoglobin: 9.8 g/dL — ABNORMAL LOW (ref 13.0–17.0)
Lymphocytes Relative: 26 %
Lymphs Abs: 3 10*3/uL (ref 0.7–4.0)
MCH: 32.8 pg (ref 26.0–34.0)
MCHC: 36.7 g/dL — ABNORMAL HIGH (ref 30.0–36.0)
MCV: 89.3 fL (ref 80.0–100.0)
Monocytes Absolute: 0.6 10*3/uL (ref 0.1–1.0)
Monocytes Relative: 5 %
Neutro Abs: 7.1 10*3/uL (ref 1.7–7.7)
Neutrophils Relative %: 58 %
Platelets: 453 10*3/uL — ABNORMAL HIGH (ref 150–400)
RBC: 2.99 MIL/uL — ABNORMAL LOW (ref 4.22–5.81)
RDW: 20.5 % — ABNORMAL HIGH (ref 11.5–15.5)
WBC: 11.6 10*3/uL — ABNORMAL HIGH (ref 4.0–10.5)
nRBC: 3 /100 WBC — ABNORMAL HIGH
nRBC: 4.1 % — ABNORMAL HIGH (ref 0.0–0.2)

## 2020-10-26 LAB — COMPREHENSIVE METABOLIC PANEL
ALT: 21 U/L (ref 0–44)
AST: 42 U/L — ABNORMAL HIGH (ref 15–41)
Albumin: 4.1 g/dL (ref 3.5–5.0)
Alkaline Phosphatase: 93 U/L (ref 38–126)
Anion gap: 10 (ref 5–15)
BUN: 7 mg/dL (ref 6–20)
CO2: 23 mmol/L (ref 22–32)
Calcium: 8.9 mg/dL (ref 8.9–10.3)
Chloride: 107 mmol/L (ref 98–111)
Creatinine, Ser: 0.73 mg/dL (ref 0.61–1.24)
GFR, Estimated: >60 mL/min (ref >60)
Glucose, Bld: 104 mg/dL — ABNORMAL HIGH (ref 70–99)
Potassium: 3.5 mmol/L (ref 3.5–5.1)
Sodium: 140 mmol/L (ref 135–145)
Total Bilirubin: 5.2 mg/dL — ABNORMAL HIGH (ref 0.3–1.2)
Total Protein: 7.7 g/dL (ref 6.5–8.1)

## 2020-10-26 LAB — RETICULOCYTES
Immature Retic Fract: 30 % — ABNORMAL HIGH (ref 2.3–15.9)
RBC.: 2.99 MIL/uL — ABNORMAL LOW (ref 4.22–5.81)
Retic Count, Absolute: 509.5 10*3/uL — ABNORMAL HIGH (ref 19.0–186.0)
Retic Ct Pct: 16.7 % — ABNORMAL HIGH (ref 0.4–3.1)

## 2020-10-26 MED ORDER — HYDROMORPHONE HCL 1 MG/ML IJ SOLN
1.0000 mg | INTRAMUSCULAR | Status: AC
Start: 2020-10-26 — End: 2020-10-26
  Administered 2020-10-26: 1 mg via INTRAVENOUS
  Filled 2020-10-26: qty 1

## 2020-10-26 MED ORDER — HYDROMORPHONE HCL 1 MG/ML IJ SOLN
1.0000 mg | Freq: Once | INTRAMUSCULAR | Status: AC
  Administered 2020-10-26: 1 mg via INTRAVENOUS
  Filled 2020-10-26: qty 1

## 2020-10-26 MED ORDER — OXYCODONE HCL 5 MG PO TABS
15.0000 mg | ORAL_TABLET | Freq: Once | ORAL | Status: AC
  Administered 2020-10-26: 15 mg via ORAL
  Filled 2020-10-26: qty 3

## 2020-10-26 MED ORDER — DEXTROSE-NACL 5-0.45 % IV SOLN: INTRAVENOUS | Status: DC

## 2020-10-26 NOTE — ED Provider Notes (Signed)
[facility]-EMERGENCY DEPT Provider Note   CSN: [id number] Arrival date & time: 10/26/20  1905     History Chief Complaint  Patient presents with  . Sickle Cell Pain Crisis    Matthew Frederick is a 20 y.o. male with a past medical history of sickle cell anemia presenting to the ED with chief complaint of sickle cell pain crisis.  States that he started having pain throughout his entire body but mostly in his back and legs which is typical of his pain crises.  Minimal improvement noted with home oxycodone and Toradol.  He denies any specific chest pain, abdominal pain, vomiting or fevers.  No shortness of breath. No numbness or weakness.  HPI     Past Medical History:  Diagnosis Date  . Sickle cell anemia [facility])     Patient Active Problem List   Diagnosis Date Noted  . Acute intractable headache 05/28/2020  . Chronic pain syndrome 05/28/2020  . Sickle cell pain crisis (HCC) 01/21/2020  . Anemia   . Sickle-cell crisis (HCC) 09/03/2019  . Sickle cell anemia with pain (HCC) 08/05/2019  . Abnormal liver function 04/02/2019  . Thrombocytopenia (HCC) 11/24/2018  . Leukocytosis 11/22/2018  . Chest pain   . Coccyx pain   . Vasoocclusive sickle cell crisis (HCC)   . Fever   . Sickle cell crisis (HCC) 07/23/2016  . Transition of care performed with sharing of clinical summary 04/28/2016  . Need for immunization against influenza 04/22/2012    History reviewed. No pertinent surgical history.     Family History  Problem Relation Age of Onset  . Sickle cell anemia Father   . Diabetes Maternal Grandmother     Social History   Tobacco Use  . Smoking status: Never Smoker  . Smokeless tobacco: Never Used  Vaping Use  . Vaping Use: Never used  Substance Use Topics  . Alcohol use: No  . Drug use: No    Home Medications Prior to Admission medications   Medication Sig Start Date End Date Taking? Authorizing Provider  acetaminophen (TYLENOL) 500  MG tablet Take 2 tablets (1,000 mg total) by mouth every 6 (six) hours as needed for headache. 06/01/20   Massie Maroon, FNP  folic acid (FOLVITE) 1 MG tablet Take 1 mg by mouth at bedtime.     [provider]  hydroxyurea (HYDREA) 500 MG capsule Take 1,500 mg by mouth at bedtime.     [provider]  ketorolac (TORADOL) 10 MG tablet Take 1 tablet (10 mg total) by mouth every 6 (six) hours as needed. 10/02/20   Koleen Distance, MD  naproxen sodium (ALEVE) 220 MG tablet Take 440 mg by mouth daily as needed (pain).    [provider]  Oxycodone HCl 10 MG TABS Take 1 tablet (10 mg total) by mouth every 4 (four) hours as needed. 10/02/20   Koleen Distance, MD    Allergies    Omnipaque [iohexol]  Review of Systems   Review of Systems  Constitutional: Negative for appetite change, chills and fever.  HENT: Negative for ear pain, rhinorrhea, sneezing and sore throat.   Eyes: Negative for photophobia and visual disturbance.  Respiratory: Negative for cough, chest tightness, shortness of breath and wheezing.   Cardiovascular: Negative for chest pain and palpitations.  Gastrointestinal: Negative for abdominal pain, blood in stool, constipation, diarrhea, nausea and vomiting.  Genitourinary: Negative for dysuria, hematuria and urgency.  Musculoskeletal: Positive for myalgias.  Skin: Negative for  rash.  Neurological: Negative for dizziness, weakness and light-headedness.    Physical Exam Updated Vital Signs BP 106/63   Pulse 93   Temp 97.9 F (36.6 C) (Oral)   Resp 17   Ht 5\' 10"  (1.778 m)   Wt 62.6 kg   SpO2 97%   BMI 19.80 kg/m   Physical Exam Vitals and nursing note reviewed.  Constitutional:      General: He is not in acute distress.    Appearance: He is well-developed.  HENT:     Head: Normocephalic and atraumatic.     Nose: Nose normal.  Eyes:     General: No scleral icterus.       Left eye: No discharge.     Conjunctiva/sclera: Conjunctivae  normal.  Cardiovascular:     Rate and Rhythm: Normal rate and regular rhythm.     Heart sounds: Normal heart sounds. No murmur heard. No friction rub. No gallop.   Pulmonary:     Effort: Pulmonary effort is normal. No respiratory distress.     Breath sounds: Normal breath sounds.  Abdominal:     General: Bowel sounds are normal. There is no distension.     Palpations: Abdomen is soft.     Tenderness: There is no abdominal tenderness. There is no guarding.  Musculoskeletal:        General: Normal range of motion.     Cervical back: Normal range of motion and neck supple.     Comments: No deformities noted.  Moving all extremities without difficulty.  No midline tenderness of the C, T or L-spine.  No step-off palpated.  Skin:    General: Skin is warm and dry.     Findings: No rash.  Neurological:     Mental Status: He is alert.     Motor: No abnormal muscle tone.     Coordination: Coordination normal.     ED Results / Procedures / Treatments   Labs (all labs ordered are listed, but only abnormal results are displayed) Labs Reviewed  CBC WITH DIFFERENTIAL/PLATELET - Abnormal; Notable for the following components:      Result Value   WBC 11.6 (*)    RBC 2.99 (*)    Hemoglobin 9.8 (*)    HCT 26.7 (*)    MCHC 36.7 (*)    RDW 20.5 (*)    Platelets 453 (*)    nRBC 4.1 (*)    Eosinophils Absolute 0.6 (*)    Basophils Absolute 0.3 (*)    nRBC 3 (*)    All other components within normal limits  COMPREHENSIVE METABOLIC PANEL - Abnormal; Notable for the following components:   Glucose, Bld 104 (*)    AST 42 (*)    Total Bilirubin 5.2 (*)    All other components within normal limits  RETICULOCYTES - Abnormal; Notable for the following components:   Retic Ct Pct 16.7 (*)    RBC. 2.99 (*)    Retic Count, Absolute 509.5 (*)    Immature Retic Fract 30.0 (*)    All other components within normal limits  RESP PANEL BY RT-PCR (FLU A&B, COVID) ARPGX2  TROPONIN I (HIGH SENSITIVITY)     EKG None  Radiology No results found.  Procedures Procedures   Medications Ordered in ED Medications  dextrose 5 %-0.45 % sodium chloride infusion ( Intravenous Rate/Dose Change 10/26/20 2342)  HYDROmorphone (DILAUDID) injection 1 mg (1 mg Intravenous Given 10/26/20 2130)  HYDROmorphone (DILAUDID) injection 1 mg (1 mg Intravenous Given  10/26/20 2204)  oxyCODONE (Oxy IR/ROXICODONE) immediate release tablet 15 mg (15 mg Oral Given 10/26/20 2320)  HYDROmorphone (DILAUDID) injection 1 mg (1 mg Intravenous Given 10/26/20 2340)    ED Course  I have reviewed the triage vital signs and the nursing notes.  Pertinent labs & imaging results that were available during my care of the patient were reviewed by me and considered in my medical decision making (see chart for details).  Clinical Course as of 10/26/20 2354  Fri Oct 26, 2020  2306 Patient continues to be in pain despite 2 doses of Dilaudid.  Will order oxycodone reassess [HK]  2308 SpO2: 97 % [HK]  2335 Pulse Rate: 93 [HK]    Clinical Course User Index [HK] Dietrich Pates, PA-C   MDM Rules/Calculators/A&P                          20 year old male presenting to the ED for sickle cell pain crisis in his back and legs.  Reports pain all over but this is typical of his pain crises.  No specific chest pain, abdominal pain or vomiting.  Vital signs within normal limits.  Will obtain lab work and give pain medication and reassess.  Lab work including CBC is unremarkable.  Hemoglobin of 9.8 which is similar to priors.  He does have an elevation in his total bilirubin which appears chronic for him.  I suspect that his symptoms are due to his typical pain crises.  No evidence of acute chest or other complicating factor at this time.  Patient was given 2 doses of IV Dilaudid as well as oral pain medication oxycodone and continues to be in pain.  He is requesting admission for ongoing pain management.  Will admit to hospitalist service.  11:34  PM Patient now complaining of chest pain.  I have ordered additional pain medication as well as obtaining an EKG and chest x-ray.  Lungs are clear and repeat temperature is normal. Patient does report history of acute chest syndrome in the past but states this feels different.   11:41 PM EKG without any ischemic changes. Will order troponin but will require admission regardless for ongoing pain management.   11:54 PM Care handed off to oncoming team pending remainder of workup and consult for admission.   Portions of this note were generated with Scientist, clinical (histocompatibility and immunogenetics). Dictation errors may occur despite best attempts at proofreading.  Final Clinical Impression(s) / ED Diagnoses Final diagnoses:  Sickle cell pain crisis [facility])    Rx / DC Orders ED Discharge Orders    None       Dietrich Pates, PA-C 10/26/20 2354    Terrilee Files, MD 10/27/20 [phone]

## 2020-10-26 NOTE — ED Provider Notes (Signed)
Care assumed from [place], [place].  Please see her full H&P.  In short,  Matthew Frederick is a 20 y.o. male presents for sickle cell pain crisis.  Reports pain throughout his entire body but mostly in his legs and back which is typical for him.  Patient given 2 doses of Dilaudid and oral codeine without relief.  Initial provider initiated admission for continued pain control and upon reevaluation of the patient he began to complain of chest pain.  Does have a history of acute chest but states this feels different.  Chest pain work-up initiated -plan: Chest pain work-up and admission.   Physical Exam  BP 106/63   Pulse 93   Temp 97.9 F (36.6 C) (Oral)   Resp 17   Ht 5\' 10"  (1.778 m)   Wt 62.6 kg   SpO2 97%   BMI 19.80 kg/m   Physical Exam Vitals and nursing note reviewed.  Constitutional:      General: He is not in acute distress.    Appearance: He is well-developed.     Comments: Patient resting comfortably.  HENT:     Head: Normocephalic.  Eyes:     General: No scleral icterus.    Conjunctiva/sclera: Conjunctivae normal.  Cardiovascular:     Rate and Rhythm: Normal rate.     Comments: No tachycardia Pulmonary:     Effort: Pulmonary effort is normal.     Comments: No increased work of breathing Musculoskeletal:        General: Normal range of motion.     Cervical back: Normal range of motion.  Skin:    General: Skin is warm and dry.  Neurological:     Mental Status: He is alert.     ED Course/Procedures   Clinical Course as of 10/27/20 0110  Fri Oct 26, 2020  2306 Patient continues to be in pain despite 2 doses of Dilaudid.  Will order oxycodone reassess [HK]  2308 SpO2: 97 % [HK]  2335 Pulse Rate: 93 [HK]    Clinical Course User Index [HK] Khatri, Hina, PA-C   ECG: Inverted T waves in V1 through V3.  Baseline wander but EKG appears unchanged from 09/15/2020.        Procedures  MDM   Patient with sickle cell pain crisis.  Will need admission.  Now  complaining of chest pain.  Chest pain work-up pending.  Patient does have a history of acute chest but no history of pulmonary embolism.  No tachycardia or shortness of breath today.  Reticulocyte count and hemoglobin baseline for patient.  Pending chest x-ray and troponin.  12:56 AM Patient continues to complain of pain.  Troponin negative.  Chest x-ray without evidence of acute infiltrate to suggest acute chest syndrome.  Patient will be admitted for further pain control.  1:10 AM Discussed patient's case with hospitalist, Dr. 11/15/2020.  I have recommended admission and patient (and family if present) agree with this plan. Admitting physician will place admission orders.     Sickle cell pain crisis [facility])  Chest pain, unspecified type      [facility] 10/27/20 0110    10/29/20, MD 10/27/20 [phone]

## 2020-10-26 NOTE — ED Triage Notes (Signed)
Emergency Medicine Provider Triage Evaluation Note  Matthew Frederick , a 20 y.o. male  was evaluated in triage.  Pt complains of sickle cell pain crisis  Review of Systems  Positive: Back pain, leg pain bilaterally Negative: Pain, shortness of breath, fevers  Physical Exam  BP 128/84 (BP Location: Right Arm)   Pulse (!) 117   Temp 97.9 F (36.6 C) (Oral)   Resp 16   Ht 5\' 10"  (1.778 m)   Wt 62.6 kg   SpO2 95%   BMI 19.80 kg/m  Gen:   Awake, no distress   HEENT:  Atraumatic Resp:  Normal effort  Cardiac:  Normal rate  Abd:   Nondistended, nontender  MSK:   Moves extremities without difficulty Neuro:  Speech clear   Medical Decision Making  Medically screening exam initiated at 7:19 PM.  Appropriate orders placed.  Pointer was informed that the remainder of the evaluation will be completed by another provider, this initial triage assessment does not replace that evaluation, and the importance of remaining in the ED until their evaluation is complete.  Clinical Impression  20 year old male who presents to the ER with complaints of sickle cell pain.  Complains of back pain and leg pain which is typical for his crises.  He has taken Toradol and 10 mg of oxycodone about an hour ago.  Symptom onset about an hour ago.  No chest pain or shortness of breath.  Initiate sickle cell labs.  Stable for further evaluation.   26, PA-C 10/26/20 [id number]

## 2020-10-26 NOTE — ED Triage Notes (Addendum)
Patient presents with generalized sickle cell pain, with worsening pain in lower back and legs. Patient states 10mg  toradol and 10mg  oxy PO x1 hour ago. Denies other symptoms.

## 2020-10-27 DIAGNOSIS — Z888 Allergy status to other drugs, medicaments and biological substances status: Secondary | ICD-10-CM | POA: Diagnosis not present

## 2020-10-27 DIAGNOSIS — R0789 Other chest pain: Secondary | ICD-10-CM

## 2020-10-27 DIAGNOSIS — E871 Hypo-osmolality and hyponatremia: Secondary | ICD-10-CM | POA: Diagnosis present

## 2020-10-27 DIAGNOSIS — Z20822 Contact with and (suspected) exposure to covid-19: Secondary | ICD-10-CM | POA: Diagnosis present

## 2020-10-27 DIAGNOSIS — D638 Anemia in other chronic diseases classified elsewhere: Secondary | ICD-10-CM | POA: Diagnosis present

## 2020-10-27 DIAGNOSIS — D5700 Hb-SS disease with crisis, unspecified: Secondary | ICD-10-CM | POA: Diagnosis present

## 2020-10-27 DIAGNOSIS — Z832 Family history of diseases of the blood and blood-forming organs and certain disorders involving the immune mechanism: Secondary | ICD-10-CM | POA: Diagnosis not present

## 2020-10-27 DIAGNOSIS — R509 Fever, unspecified: Secondary | ICD-10-CM | POA: Diagnosis not present

## 2020-10-27 DIAGNOSIS — G894 Chronic pain syndrome: Secondary | ICD-10-CM | POA: Diagnosis present

## 2020-10-27 DIAGNOSIS — Z833 Family history of diabetes mellitus: Secondary | ICD-10-CM | POA: Diagnosis not present

## 2020-10-27 DIAGNOSIS — D72829 Elevated white blood cell count, unspecified: Secondary | ICD-10-CM | POA: Diagnosis not present

## 2020-10-27 DIAGNOSIS — Z79899 Other long term (current) drug therapy: Secondary | ICD-10-CM | POA: Diagnosis not present

## 2020-10-27 LAB — RESP PANEL BY RT-PCR (FLU A&B, COVID) ARPGX2
Influenza A by PCR: NEGATIVE
Influenza B by PCR: NEGATIVE
SARS Coronavirus 2 by RT PCR: NEGATIVE

## 2020-10-27 LAB — TROPONIN I (HIGH SENSITIVITY)
Troponin I (High Sensitivity): 11 ng/L (ref <18)
Troponin I (High Sensitivity): 11 ng/L (ref <18)

## 2020-10-27 MED ORDER — FOLIC ACID 1 MG PO TABS
1.0000 mg | ORAL_TABLET | Freq: Every day | ORAL | Status: DC
  Administered 2020-10-27 – 2020-10-29 (×3): 1 mg via ORAL
  Filled 2020-10-27 (×4): qty 1

## 2020-10-27 MED ORDER — SODIUM CHLORIDE 0.9 % IV SOLN
25.0000 mg | INTRAVENOUS | Status: DC | PRN
  Filled 2020-10-27: qty 0.5

## 2020-10-27 MED ORDER — SODIUM CHLORIDE 0.9% FLUSH: 9.0000 mL | INTRAVENOUS | Status: DC | PRN

## 2020-10-27 MED ORDER — HYDROMORPHONE HCL 1 MG/ML IJ SOLN
1.0000 mg | INTRAMUSCULAR | Status: AC
  Administered 2020-10-27: 1 mg via INTRAVENOUS
  Filled 2020-10-27 (×2): qty 1

## 2020-10-27 MED ORDER — SENNOSIDES-DOCUSATE SODIUM 8.6-50 MG PO TABS
1.0000 | ORAL_TABLET | Freq: Two times a day (BID) | ORAL | Status: DC
  Administered 2020-10-27 – 2020-10-30 (×6): 1 via ORAL
  Filled 2020-10-27 (×8): qty 1

## 2020-10-27 MED ORDER — ENOXAPARIN SODIUM 40 MG/0.4ML ~~LOC~~ SOLN
40.0000 mg | SUBCUTANEOUS | Status: DC
  Administered 2020-10-27 – 2020-10-30 (×4): 40 mg via SUBCUTANEOUS
  Filled 2020-10-27 (×4): qty 0.4

## 2020-10-27 MED ORDER — HYDROMORPHONE 1 MG/ML IV SOLN
INTRAVENOUS | Status: DC
  Administered 2020-10-27: 2.5 mg via INTRAVENOUS
  Administered 2020-10-27: 5.5 mg via INTRAVENOUS
  Administered 2020-10-27: 5 mg via INTRAVENOUS
  Administered 2020-10-27: 2 mg via INTRAVENOUS
  Administered 2020-10-27: 30 mg via INTRAVENOUS
  Administered 2020-10-27: 5 mg via INTRAVENOUS
  Administered 2020-10-28: 30 mg via INTRAVENOUS
  Administered 2020-10-28: 7.09 mg via INTRAVENOUS
  Administered 2020-10-28: 1.5 mg via INTRAVENOUS
  Administered 2020-10-28: 3.7 mg via INTRAVENOUS
  Administered 2020-10-28: 3 mg via INTRAVENOUS
  Administered 2020-10-28: 6.5 mg via INTRAVENOUS
  Administered 2020-10-28: 1 mg via INTRAVENOUS
  Administered 2020-10-29: 30 mg via INTRAVENOUS
  Administered 2020-10-29: 5.5 mg via INTRAVENOUS
  Administered 2020-10-30: 30 mg via INTRAVENOUS
  Filled 2020-10-27 (×4): qty 30

## 2020-10-27 MED ORDER — HYDROMORPHONE HCL 1 MG/ML IJ SOLN
1.0000 mg | Freq: Once | INTRAMUSCULAR | Status: AC
  Administered 2020-10-27: 1 mg via INTRAVENOUS

## 2020-10-27 MED ORDER — SODIUM CHLORIDE 0.45 % IV SOLN: INTRAVENOUS | Status: AC

## 2020-10-27 MED ORDER — DIPHENHYDRAMINE HCL 25 MG PO CAPS
25.0000 mg | ORAL_CAPSULE | ORAL | Status: DC | PRN
  Administered 2020-10-28 – 2020-10-29 (×2): 25 mg via ORAL
  Filled 2020-10-27 (×2): qty 1

## 2020-10-27 MED ORDER — ONDANSETRON HCL 4 MG/2ML IJ SOLN
4.0000 mg | Freq: Four times a day (QID) | INTRAMUSCULAR | Status: DC | PRN
  Administered 2020-10-28: 4 mg via INTRAVENOUS
  Filled 2020-10-27: qty 2

## 2020-10-27 MED ORDER — HYDROXYUREA 500 MG PO CAPS
1500.0000 mg | ORAL_CAPSULE | Freq: Every day | ORAL | Status: DC
Start: 2020-10-27 — End: 2020-10-30
  Administered 2020-10-27 – 2020-10-29 (×3): 1500 mg via ORAL
  Filled 2020-10-27 (×4): qty 3

## 2020-10-27 MED ORDER — POLYETHYLENE GLYCOL 3350 17 G PO PACK: 17.0000 g | PACK | Freq: Every day | ORAL | Status: DC | PRN

## 2020-10-27 MED ORDER — OXYCODONE HCL 5 MG PO TABS
10.0000 mg | ORAL_TABLET | ORAL | Status: DC | PRN
  Administered 2020-10-27 – 2020-10-29 (×7): 10 mg via ORAL
  Filled 2020-10-27 (×8): qty 2

## 2020-10-27 MED ORDER — NALOXONE HCL 0.4 MG/ML IJ SOLN: 0.4000 mg | INTRAMUSCULAR | Status: DC | PRN

## 2020-10-27 MED ORDER — KETOROLAC TROMETHAMINE 15 MG/ML IJ SOLN
15.0000 mg | Freq: Four times a day (QID) | INTRAMUSCULAR | Status: DC
  Administered 2020-10-27 – 2020-10-30 (×15): 15 mg via INTRAVENOUS
  Filled 2020-10-27 (×15): qty 1

## 2020-10-27 NOTE — H&P (Signed)
History and Physical    Matthew Frederick ELF:[id number] DOB: 03/13/2001 DOA: 10/26/2020  PCP: [name], MD   Patient coming from: Home   Chief Complaint: Pain   HPI: Matthew Frederick is a 20 y.o. male with medical history significant for sickle cell anemia, now presenting to the emergency department for evaluation of severe pain.  Patient reports he had been in his usual state of health when he began to develop severe pain yesterday.  Reports pain in all extremities, back, and chest, but most severe in the low back and legs and overall similar to his prior pain crises though seems to be more intense than usual.  He was taking Toradol and oxycodone at home without any appreciable improvement.  He denies any recent fevers, chills, upper respiratory symptoms, abdominal pain, vomiting, or diarrhea.  Denies any shortness of breath or cough.  ED Course: Upon arrival to the ED, patient is found to be afebrile, saturating well on room air, and with stable blood pressure.  EKG features sinus rhythm with LVH and nonspecific T wave abnormality.  Chemistry panel notable for elevated bilirubin similar to prior.  CBC appears stable.  Covid and influenza PCR are negative.  Chest x-ray was negative for acute cardiopulmonary disease.  Patient was given IV fluids, oxycodone, and multiple doses IV Dilaudid in the ED but continues to complain of severe pain.  Review of Systems:  All other systems reviewed and apart from HPI, are negative.  Past Medical History:  Diagnosis Date  . Sickle cell anemia (HCC)     History reviewed. No pertinent surgical history.  Social History:   reports that he has never smoked. He has never used smokeless tobacco. He reports that he does not drink alcohol and does not use drugs.  Allergies  Allergen Reactions  . Omnipaque [Iohexol] Itching    Family History  Problem Relation Age of Onset  . Sickle cell anemia Father   . Diabetes Maternal Grandmother       Prior to Admission medications   Medication Sig Start Date End Date Taking? Authorizing Provider  acetaminophen (TYLENOL) 500 MG tablet Take 2 tablets (1,000 mg total) by mouth every 6 (six) hours as needed for headache. 06/01/20  Yes Massie Maroon, FNP  folic acid (FOLVITE) 1 MG tablet Take 1 mg by mouth at bedtime.    Yes [provider]  HYDROcodone-acetaminophen (NORCO/VICODIN) 5-325 MG tablet Take 1-2 tablets by mouth every 8 (eight) hours as needed for moderate pain.   Yes [provider]  hydroxyurea (HYDREA) 500 MG capsule Take 1,500 mg by mouth at bedtime.    Yes [provider]  ketorolac (TORADOL) 10 MG tablet Take 1 tablet (10 mg total) by mouth every 6 (six) hours as needed. Patient taking differently: Take 10 mg by mouth every 6 (six) hours as needed for moderate pain. 10/02/20  Yes Koleen Distance, MD  naproxen sodium (ALEVE) 220 MG tablet Take 440 mg by mouth daily as needed (pain).   Yes [provider]  Oxycodone HCl 10 MG TABS Take 1 tablet (10 mg total) by mouth every 4 (four) hours as needed. Patient taking differently: Take 5-10 mg by mouth every 4 (four) hours as needed (pain). 10/02/20  Yes Koleen Distance, MD    Physical Exam: Vitals:   10/26/20 2300 10/26/20 2355 10/27/20 0000 10/27/20 0030  BP: 106/63  114/67 118/81  Pulse: 93  89 97  Resp: 17  19 17   Temp:  97.7 F (36.5 C)    TempSrc:  Oral    SpO2: 97%  96% 97%  Weight:      Height:        Constitutional: No respiratory distress, appears uncomfortable  Eyes: PERTLA, lids and conjunctivae normal  ENMT: Mucous membranes are moist. Posterior pharynx clear of any exudate or lesions.   Neck:  supple, no masses  Respiratory:  no wheezing, no crackles. No accessory muscle use.  Cardiovascular: S1 & S2 heard, regular rate and rhythm. No extremity edema.  Abdomen: No distension, no tenderness, soft. Bowel sounds active.  Musculoskeletal: no clubbing / cyanosis. No  joint deformity upper and lower extremities.   Skin: no significant rashes, lesions, ulcers. Warm, dry, well-perfused. Neurologic: CN 2-12 grossly intact. Sensation intact. Moving all extremities.  Psychiatric: Alert and oriented to person, place, and situation. Cooperative.    Labs and Imaging on Admission: I have personally reviewed following labs and imaging studies  CBC: Recent Labs  Lab 10/26/20 1924  WBC 11.6*  NEUTROABS 7.1  HGB 9.8*  HCT 26.7*  MCV 89.3  PLT 453*   Basic Metabolic Panel: Recent Labs  Lab 10/26/20 1924  NA 140  K 3.5  CL 107  CO2 23  GLUCOSE 104*  BUN 7  CREATININE 0.73  CALCIUM 8.9   GFR: Estimated Creatinine Clearance: 130.4 mL/min (by C-G formula based on SCr of 0.73 mg/dL). Liver Function Tests: Recent Labs  Lab 10/26/20 1924  AST 42*  ALT 21  ALKPHOS 93  BILITOT 5.2*  PROT 7.7  ALBUMIN 4.1   No results for input(s): LIPASE, AMYLASE in the last 168 hours. No results for input(s): AMMONIA in the last 168 hours. Coagulation Profile: No results for input(s): INR, PROTIME in the last 168 hours. Cardiac Enzymes: No results for input(s): CKTOTAL, CKMB, CKMBINDEX, TROPONINI in the last 168 hours. BNP (last 3 results) No results for input(s): PROBNP in the last 8760 hours. HbA1C: No results for input(s): HGBA1C in the last 72 hours. CBG: No results for input(s): GLUCAP in the last 168 hours. Lipid Profile: No results for input(s): CHOL, HDL, LDLCALC, TRIG, CHOLHDL, LDLDIRECT in the last 72 hours. Thyroid Function Tests: No results for input(s): TSH, T4TOTAL, FREET4, T3FREE, THYROIDAB in the last 72 hours. Anemia Panel: Recent Labs    10/26/20 1924  RETICCTPCT 16.7*   Urine analysis:    Component Value Date/Time   COLORURINE YELLOW 09/08/2019 2226   APPEARANCEUR CLEAR 09/08/2019 2226   LABSPEC 1.014 09/08/2019 2226   PHURINE 7.0 09/08/2019 2226   GLUCOSEU NEGATIVE 09/08/2019 2226   HGBUR NEGATIVE 09/08/2019 2226    BILIRUBINUR NEGATIVE 09/08/2019 2226   KETONESUR NEGATIVE 09/08/2019 2226   PROTEINUR NEGATIVE 09/08/2019 2226   NITRITE NEGATIVE 09/08/2019 2226   LEUKOCYTESUR NEGATIVE 09/08/2019 2226   Sepsis Labs: @LABRCNTIP (procalcitonin:4,lacticidven:4) ) Recent Results (from the past 240 hour(s))  Resp Panel by RT-PCR (Flu A&B, Covid) Nasopharyngeal Swab     Status: None   Collection Time: 10/26/20 11:25 PM   Specimen: Nasopharyngeal Swab; Nasopharyngeal(NP) swabs in vial transport medium  Result Value Ref Range Status   SARS Coronavirus 2 by RT PCR NEGATIVE NEGATIVE Final    Comment: (NOTE) SARS-CoV-2 target nucleic acids are NOT DETECTED.  The SARS-CoV-2 RNA is generally detectable in upper respiratory specimens during the acute phase of infection. The lowest concentration of SARS-CoV-2 viral copies this assay can detect is 138 copies/mL. A negative result does not preclude SARS-Cov-2 infection and should not be used as  the sole basis for treatment or other patient management decisions. A negative result may occur with  improper specimen collection/handling, submission of specimen other than nasopharyngeal swab, presence of viral mutation(s) within the areas targeted by this assay, and inadequate number of viral copies(<138 copies/mL). A negative result must be combined with clinical observations, patient history, and epidemiological information. The expected result is Negative.  Fact Sheet for Patients:  BloggerCourse.com  Fact Sheet for Healthcare Providers:  SeriousBroker.it  This test is no t yet approved or cleared by the [place] FDA and  has been authorized for detection and/or diagnosis of SARS-CoV-2 by FDA under an Emergency Use Authorization (EUA). This EUA will remain  in effect (meaning this test can be used) for the duration of the COVID-19 declaration under Section 564(b)(1) of the Act, 21 U.S.C.section  360bbb-3(b)(1), unless the authorization is terminated  or revoked sooner.       Influenza A by PCR NEGATIVE NEGATIVE Final   Influenza B by PCR NEGATIVE NEGATIVE Final    Comment: (NOTE) The Xpert Xpress SARS-CoV-2/FLU/RSV plus assay is intended as an aid in the diagnosis of influenza from Nasopharyngeal swab specimens and should not be used as a sole basis for treatment. Nasal washings and aspirates are unacceptable for Xpert Xpress SARS-CoV-2/FLU/RSV testing.  Fact Sheet for Patients: BloggerCourse.com  Fact Sheet for Healthcare Providers: SeriousBroker.it  This test is not yet approved or cleared by the [place] FDA and has been authorized for detection and/or diagnosis of SARS-CoV-2 by FDA under an Emergency Use Authorization (EUA). This EUA will remain in effect (meaning this test can be used) for the duration of the COVID-19 declaration under Section 564(b)(1) of the Act, 21 U.S.C. section 360bbb-3(b)(1), unless the authorization is terminated or revoked.  Performed at [facility], [address], [place], [place] [address]      Radiological Exams on Admission: DG Chest Portable 1 View  Result Date: 10/26/2020 CLINICAL DATA:  Sickle cell.  Chest pain. EXAM: PORTABLE CHEST 1 VIEW COMPARISON:  Chest x-ray 09/15/2020, CT chest 09/15/2020 FINDINGS: The heart size and mediastinal contours are within normal limits. No focal consolidation. No pulmonary edema. No pleural effusion. No pneumothorax. No acute osseous abnormality. IMPRESSION: No active disease. Electronically Signed   By: Tish Frederickson M.D.   On: 10/26/2020 23:54    EKG: Independently reviewed. Sinus rhythm, LVH, non-specific T-wave abnormalities.   Assessment/Plan   1. Sickle cell pain crisis  - Presents with severe generalized pain most severe in low back and legs similar to prior crises, worsening despite Toradol and oxycodone at  home  - Blood counts stable, pain remains severe despite multiple doses IV Dilaudid in ED  - Continue IVF hydration, schedule Toradol, start weight-based Dilaudid PCA    2. Chest pain  - Pain is diffuse, Initial troponin normal, EKG similar to priors, no acute findings on CXR, no associated hypoxia or cough  - Continue pain-control, monitoring    DVT prophylaxis: Lovenox  Code Status: Full  Level of Care: Level of care: Med-Surg Family Communication: None present  Disposition Plan:  Patient is from: home   Anticipated d/c is to: Home  Anticipated d/c date is: 10/30/20 Patient currently: pending pain-control  Consults called: none  Admission status: Inpatient     Briscoe Deutscher, MD [facility]  10/27/2020, 1:33 AM

## 2020-10-27 NOTE — Progress Notes (Signed)
Matthew Frederick is a 20 year old male with a medical history significant for sickle cell disease and chronic pain syndrome that was admitted overnight for sickle cell pain crisis.  Patient continues to have pain primarily to upper extremities, lower extremities and low back.  He rates pain as 8/10.  Agree with current care plan, will reassess in AM.   [name]  APRN, MSN, FNP-C Patient Care [facility] [address] [place], [place] [address] [phone]

## 2020-10-27 NOTE — Progress Notes (Signed)
Patient vomited night time meds. Wasted meds with 2nd RN. Patient refusing any antiemetic medication at this time. Patient stated he will hit call light when he is ready to take meds again.

## 2020-10-27 NOTE — ED Notes (Signed)
wl

## 2020-10-27 NOTE — Plan of Care (Signed)
  Problem: Pain Managment: Goal: General experience of comfort will improve Description: Patient's general experience of comfort will improve by 10/31/20 Outcome: Progressing

## 2020-10-27 NOTE — ED Notes (Signed)
Pt reports mid chest pain. Tenderness w/palpation. PA notified and orders given.

## 2020-10-27 NOTE — ED Notes (Signed)
Pt placed on 2L nasal cannula. O2 saturation fell to mid 80s with good pleth as he slept.

## 2020-10-28 ENCOUNTER — Inpatient Hospital Stay (HOSPITAL_COMMUNITY): Payer: Medicare Other

## 2020-10-28 DIAGNOSIS — D5700 Hb-SS disease with crisis, unspecified: Secondary | ICD-10-CM | POA: Diagnosis not present

## 2020-10-28 LAB — URINALYSIS, ROUTINE W REFLEX MICROSCOPIC
Bilirubin Urine: NEGATIVE
Glucose, UA: NEGATIVE mg/dL
Hgb urine dipstick: NEGATIVE
Ketones, ur: 20 mg/dL — AB
Leukocytes,Ua: NEGATIVE
Nitrite: NEGATIVE
Protein, ur: NEGATIVE mg/dL
Specific Gravity, Urine: 1.011 (ref 1.005–1.030)
pH: 6 (ref 5.0–8.0)

## 2020-10-28 LAB — COMPREHENSIVE METABOLIC PANEL
ALT: 17 U/L (ref 0–44)
AST: 28 U/L (ref 15–41)
Albumin: 3.5 g/dL (ref 3.5–5.0)
Alkaline Phosphatase: 108 U/L (ref 38–126)
Anion gap: 10 (ref 5–15)
BUN: 9 mg/dL (ref 6–20)
CO2: 25 mmol/L (ref 22–32)
Calcium: 8.4 mg/dL — ABNORMAL LOW (ref 8.9–10.3)
Chloride: 98 mmol/L (ref 98–111)
Creatinine, Ser: 0.63 mg/dL (ref 0.61–1.24)
GFR, Estimated: >60 mL/min (ref >60)
Glucose, Bld: 96 mg/dL (ref 70–99)
Potassium: 3.6 mmol/L (ref 3.5–5.1)
Sodium: 133 mmol/L — ABNORMAL LOW (ref 135–145)
Total Bilirubin: 5.5 mg/dL — ABNORMAL HIGH (ref 0.3–1.2)
Total Protein: 7 g/dL (ref 6.5–8.1)

## 2020-10-28 LAB — CBC
HCT: 25.4 % — ABNORMAL LOW (ref 39.0–52.0)
Hemoglobin: 9.2 g/dL — ABNORMAL LOW (ref 13.0–17.0)
MCH: 32.6 pg (ref 26.0–34.0)
MCHC: 36.2 g/dL — ABNORMAL HIGH (ref 30.0–36.0)
MCV: 90.1 fL (ref 80.0–100.0)
Platelets: 387 10*3/uL (ref 150–400)
RBC: 2.82 MIL/uL — ABNORMAL LOW (ref 4.22–5.81)
RDW: 17.9 % — ABNORMAL HIGH (ref 11.5–15.5)
WBC: 16.3 10*3/uL — ABNORMAL HIGH (ref 4.0–10.5)
nRBC: 4.9 % — ABNORMAL HIGH (ref 0.0–0.2)

## 2020-10-28 LAB — LACTATE DEHYDROGENASE: LDH: 515 U/L — ABNORMAL HIGH (ref 98–192)

## 2020-10-28 LAB — LACTIC ACID, PLASMA
Lactic Acid, Venous: 0.7 mmol/L (ref 0.5–1.9)
Lactic Acid, Venous: 0.8 mmol/L (ref 0.5–1.9)

## 2020-10-28 MED ORDER — HYDROMORPHONE HCL 1 MG/ML IJ SOLN
1.0000 mg | Freq: Once | INTRAMUSCULAR | Status: AC
Start: 2020-10-28 — End: 2020-10-28
  Administered 2020-10-28: 1 mg via INTRAVENOUS
  Filled 2020-10-28: qty 1

## 2020-10-28 MED ORDER — ACETAMINOPHEN 325 MG PO TABS
650.0000 mg | ORAL_TABLET | ORAL | Status: DC | PRN
  Administered 2020-10-28: 650 mg via ORAL
  Filled 2020-10-28: qty 2

## 2020-10-28 MED ORDER — SODIUM CHLORIDE 0.45 % IV SOLN: INTRAVENOUS | Status: AC

## 2020-10-28 NOTE — Progress Notes (Addendum)
Subjective: Patient febrile overnight, maximum temperature 101 F.  Patient is complaining of central chest pain.  No oxygen requirement.  He denies any headache, shortness of breath, urinary symptoms, nausea, vomiting, or diarrhea.  Patient rates pain as 7/10.  He says pain is persisting despite IV Dilaudid.  Objective:  Vital signs in last 24 hours:  Vitals:   10/28/20 0556 10/28/20 0714 10/28/20 1002 10/28/20 1201  BP: 122/71  125/76 117/79  Pulse: (!) 106  (!) 105 98  Resp: 20 18 15 16   Temp: 99.7 F (37.6 C)  (!) 101 F (38.3 C) (!) 100.5 F (38.1 C)  TempSrc: Oral  Oral Oral  SpO2: 99% 100% 100% 98%  Weight:      Height:        Intake/Output from previous day:   Intake/Output Summary (Last 24 hours) at 10/28/2020 1234 Last data filed at 10/28/2020 0806 Gross per 24 hour  Intake 120 ml  Output 1600 ml  Net -1480 ml    Physical Exam: General: Alert, awake, oriented x3, in no acute distress.  HEENT: [place]/AT PEERL, EOMI.  Scleral icterus Neck: Trachea midline,  no masses, no thyromegal,y no JVD, no carotid bruit OROPHARYNX:  Moist, No exudate/ erythema/lesions.  Heart: Regular rate and rhythm, without murmurs, rubs, gallops, PMI non-displaced, no heaves or thrills on palpation.  Lungs: Clear to auscultation, no wheezing or rhonchi noted. No increased vocal fremitus resonant to percussion  Abdomen: Soft, nontender, nondistended, positive bowel sounds, no masses no hepatosplenomegaly noted..  Neuro: No focal neurological deficits noted cranial nerves II through XII grossly intact. DTRs 2+ bilaterally upper and lower extremities. Strength 5 out of 5 in bilateral upper and lower extremities. Musculoskeletal: No warm swelling or erythema around joints, no spinal tenderness noted. Psychiatric: Patient alert and oriented x3, good insight and cognition, good recent to remote recall. Lymph node survey: No cervical axillary or inguinal lymphadenopathy noted.  Lab Results:  Basic  Metabolic Panel:    Component Value Date/Time   NA 133 (L) 10/28/2020 1058   K 3.6 10/28/2020 1058   CL 98 10/28/2020 1058   CO2 25 10/28/2020 1058   BUN 9 10/28/2020 1058   CREATININE 0.63 10/28/2020 1058   GLUCOSE 96 10/28/2020 1058   CALCIUM 8.4 (L) 10/28/2020 1058   CBC:    Component Value Date/Time   WBC 16.3 (H) 10/28/2020 1029   HGB 9.2 (L) 10/28/2020 1029   HCT 25.4 (L) 10/28/2020 1029   PLT 387 10/28/2020 1029   MCV 90.1 10/28/2020 1029   NEUTROABS 7.1 10/26/2020 1924   LYMPHSABS 3.0 10/26/2020 1924   MONOABS 0.6 10/26/2020 1924   EOSABS 0.6 (H) 10/26/2020 1924   BASOSABS 0.3 (H) 10/26/2020 1924    Recent Results (from the past 240 hour(s))  Resp Panel by RT-PCR (Flu A&B, Covid) Nasopharyngeal Swab     Status: None   Collection Time: 10/26/20 11:25 PM   Specimen: Nasopharyngeal Swab; Nasopharyngeal(NP) swabs in vial transport medium  Result Value Ref Range Status   SARS Coronavirus 2 by RT PCR NEGATIVE NEGATIVE Final    Comment: (NOTE) SARS-CoV-2 target nucleic acids are NOT DETECTED.  The SARS-CoV-2 RNA is generally detectable in upper respiratory specimens during the acute phase of infection. The lowest concentration of SARS-CoV-2 viral copies this assay can detect is 138 copies/mL. A negative result does not preclude SARS-Cov-2 infection and should not be used as the sole basis for treatment or other patient management decisions. A negative result may occur with  improper specimen collection/handling, submission of specimen other than nasopharyngeal swab, presence of viral mutation(s) within the areas targeted by this assay, and inadequate number of viral copies(<138 copies/mL). A negative result must be combined with clinical observations, patient history, and epidemiological information. The expected result is Negative.  Fact Sheet for Patients:  BloggerCourse.com  Fact Sheet for Healthcare Providers:   SeriousBroker.it  This test is no t yet approved or cleared by the [place] FDA and  has been authorized for detection and/or diagnosis of SARS-CoV-2 by FDA under an Emergency Use Authorization (EUA). This EUA will remain  in effect (meaning this test can be used) for the duration of the COVID-19 declaration under Section 564(b)(1) of the Act, 21 U.S.C.section 360bbb-3(b)(1), unless the authorization is terminated  or revoked sooner.       Influenza A by PCR NEGATIVE NEGATIVE Final   Influenza B by PCR NEGATIVE NEGATIVE Final    Comment: (NOTE) The Xpert Xpress SARS-CoV-2/FLU/RSV plus assay is intended as an aid in the diagnosis of influenza from Nasopharyngeal swab specimens and should not be used as a sole basis for treatment. Nasal washings and aspirates are unacceptable for Xpert Xpress SARS-CoV-2/FLU/RSV testing.  Fact Sheet for Patients: BloggerCourse.com  Fact Sheet for Healthcare Providers: SeriousBroker.it  This test is not yet approved or cleared by the [place] FDA and has been authorized for detection and/or diagnosis of SARS-CoV-2 by FDA under an Emergency Use Authorization (EUA). This EUA will remain in effect (meaning this test can be used) for the duration of the COVID-19 declaration under Section 564(b)(1) of the Act, 21 U.S.C. section 360bbb-3(b)(1), unless the authorization is terminated or revoked.  Performed at [facility], [address] [place], [place] [address]     Studies/Results: DG Chest 2 View  Result Date: 10/28/2020 CLINICAL DATA:  Bilateral arm pain and chest pain. Sickle cell disease. EXAM: CHEST - 2 VIEW COMPARISON:  October 26, 2020 FINDINGS: The heart size and mediastinal contours are within normal limits. Minimal left pleural effusion is identified. There is no focal infiltrate or pulmonary edema. The visualized skeletal structures  are unremarkable. IMPRESSION: No focal pneumonia. Minimal left pleural effusion. Electronically Signed   By: Sherian Rein M.D.   On: 10/28/2020 11:56   DG Chest Portable 1 View  Result Date: 10/26/2020 CLINICAL DATA:  Sickle cell.  Chest pain. EXAM: PORTABLE CHEST 1 VIEW COMPARISON:  Chest x-ray 09/15/2020, CT chest 09/15/2020 FINDINGS: The heart size and mediastinal contours are within normal limits. No focal consolidation. No pulmonary edema. No pleural effusion. No pneumothorax. No acute osseous abnormality. IMPRESSION: No active disease. Electronically Signed   By: Tish Frederickson M.D.   On: 10/26/2020 23:54    Medications: Scheduled Meds: . enoxaparin (LOVENOX) injection  40 mg Subcutaneous Q24H  . folic acid  1 mg Oral QHS  . HYDROmorphone   Intravenous Q4H  .  HYDROmorphone (DILAUDID) injection  1 mg Intravenous Once  . hydroxyurea  1,500 mg Oral QHS  . ketorolac  15 mg Intravenous Q6H  . senna-docusate  1 tablet Oral BID   Continuous Infusions: . sodium chloride 100 mL/hr at 10/28/20 1040  . diphenhydrAMINE     PRN Meds:.acetaminophen, diphenhydrAMINE **OR** diphenhydrAMINE, naloxone **AND** sodium chloride flush, ondansetron (ZOFRAN) IV, oxyCODONE, polyethylene glycol  Consultants:  None  Procedures:  None  Antibiotics:  None  Assessment/Plan: Principal Problem:   Sickle cell pain crisis (HCC) Active Problems:   Musculoskeletal chest pain  Sickle cell disease with  pain crisis: Continue IV Dilaudid PCA without any changes in settings IV fluids, 0.45% saline at 100 mL/h Toradol 50 mg IV every 6 hours for total of 5 days Oxycodone 10 mg every 4 hours as needed for severe breakthrough pain Monitor vital signs very closely, reevaluate pain scale regularly, and supplemental oxygen as needed.  Chronic pain syndrome: Continue home medication  Sickle cell anemia: Hemoglobin stable and consistent with patient's baseline.  There is no clinical indication for blood  transfusion at this time.  Continue to follow closely.  Fever of unknown origin: Patient febrile.  COVID-19 test negative.  Chest x-ray shows no acute cardiopulmonary process.  Continue to follow blood cultures, urine culture, and lactic acid. Tylenol 650 mg every 4 hours as needed for fever  Code Status: Full Code Family Communication: N/A Disposition Plan: Not yet ready for discharge  [name]  APRN, MSN, FNP-C [facility] [address] [place], [place] [address] [phone]  If 7PM-7AM, please contact night-coverage.  10/28/2020, 12:34 PM  LOS: 1 day

## 2020-10-29 LAB — CBC
HCT: 21.6 % — ABNORMAL LOW (ref 39.0–52.0)
Hemoglobin: 7.8 g/dL — ABNORMAL LOW (ref 13.0–17.0)
MCH: 32.1 pg (ref 26.0–34.0)
MCHC: 36.1 g/dL — ABNORMAL HIGH (ref 30.0–36.0)
MCV: 88.9 fL (ref 80.0–100.0)
Platelets: 326 10*3/uL (ref 150–400)
RBC: 2.43 MIL/uL — ABNORMAL LOW (ref 4.22–5.81)
RDW: 16.9 % — ABNORMAL HIGH (ref 11.5–15.5)
WBC: 14.6 10*3/uL — ABNORMAL HIGH (ref 4.0–10.5)
nRBC: 2 % — ABNORMAL HIGH (ref 0.0–0.2)

## 2020-10-29 LAB — URINE CULTURE: Culture: NO GROWTH

## 2020-10-29 LAB — TYPE AND SCREEN
ABO/RH(D): O POS
Antibody Screen: NEGATIVE

## 2020-10-29 LAB — LACTATE DEHYDROGENASE: LDH: 387 U/L — ABNORMAL HIGH (ref 98–192)

## 2020-10-29 MED ORDER — MAGNESIUM CITRATE PO SOLN
1.0000 | Freq: Once | ORAL | Status: AC
  Administered 2020-10-29: 1 via ORAL
  Filled 2020-10-29: qty 296

## 2020-10-29 NOTE — Progress Notes (Signed)
Subjective: 20 year old admitted with sickle cell painful crisis also fever of unknown origin as well as chronic pain syndrome patient doing better today pain down to 4 out of 10 but he is feeling weak.  He has not been working around he otherwise has no significant findings.  He continues to be tachycardic. Objective: Vital signs in last 24 hours: Temp:  [98.7 F (37.1 C)-99.9 F (37.7 C)] 98.7 F (37.1 C) (04/18 1740) Pulse Rate:  [98-118] 112 (04/18 1740) Resp:  [13-20] 16 (04/18 1740) BP: (112-122)/(67-76) 115/69 (04/18 1740) SpO2:  [89 %-100 %] 89 % (04/18 1740) FiO2 (%):  [0 %] 0 % (04/18 1511) Weight change:  Last BM Date:  (PTA)  Intake/Output from previous day: 04/17 0701 - 04/18 0700 In: 1838.1 [P.O.:240; I.V.:1598.1] Out: 1695 [Urine:1695] Intake/Output this shift: No intake/output data recorded.  General appearance: alert, cooperative, appears stated age and no distress Neck: no adenopathy, no carotid bruit, no JVD, supple, symmetrical, trachea midline and thyroid not enlarged, symmetric, no tenderness/mass/nodules Back: symmetric, no curvature. ROM normal. No CVA tenderness. Resp: clear to auscultation bilaterally Cardio: regular rate and rhythm, S1, S2 normal, no murmur, click, rub or gallop GI: soft, non-tender; bowel sounds normal; no masses,  no organomegaly Extremities: extremities normal, atraumatic, no cyanosis or edema Pulses: 2+ and symmetric Skin: Skin color, texture, turgor normal. No rashes or lesions Neurologic: Grossly normal  Lab Results: Recent Labs    10/28/20 1029 10/29/20 0533  WBC 16.3* 14.6*  HGB 9.2* 7.8*  HCT 25.4* 21.6*  PLT 387 326   BMET Recent Labs    10/26/20 1924 10/28/20 1058  NA 140 133*  K 3.5 3.6  CL 107 98  CO2 23 25  GLUCOSE 104* 96  BUN 7 9  CREATININE 0.73 0.63  CALCIUM 8.9 8.4*    Studies/Results: DG Chest 2 View  Result Date: 10/28/2020 CLINICAL DATA:  Bilateral arm pain and chest pain. Sickle cell  disease. EXAM: CHEST - 2 VIEW COMPARISON:  October 26, 2020 FINDINGS: The heart size and mediastinal contours are within normal limits. Minimal left pleural effusion is identified. There is no focal infiltrate or pulmonary edema. The visualized skeletal structures are unremarkable. IMPRESSION: No focal pneumonia. Minimal left pleural effusion. Electronically Signed   By: Sherian Rein M.D.   On: 10/28/2020 11:56    Medications: I have reviewed the patient's current medications.  Assessment/Plan: A 20 year old with sickle cell painful crisis.  #1 sickle cell painful crisis: Patient doing better.  Pain is down to 4 out of 10.  Patient will be mobilized today for possible discharge in the next 1 to 2 days.  #2 anemia of chronic disease: Hemoglobin dropped to 7.8 with hydration.  After H&H.  #3 leukocytosis: Continue to monitor white count.  #4 hyponatremia: Stable.  Continue monitoring  #5 sinus tachycardia: Hydrate and monitor.  Most likely due to sickle cell crisis   LOS: 2 days   Zulema Pulaski,LAWAL 10/29/2020, 6:04 PM

## 2020-10-30 LAB — CBC WITH DIFFERENTIAL/PLATELET
Abs Immature Granulocytes: 0.11 10*3/uL — ABNORMAL HIGH (ref 0.00–0.07)
Basophils Absolute: 0 10*3/uL (ref 0.0–0.1)
Basophils Relative: 0 %
Eosinophils Absolute: 0.2 10*3/uL (ref 0.0–0.5)
Eosinophils Relative: 1 %
HCT: 17.7 % — ABNORMAL LOW (ref 39.0–52.0)
Hemoglobin: 6.3 g/dL — CL (ref 13.0–17.0)
Immature Granulocytes: 1 %
Lymphocytes Relative: 17 %
Lymphs Abs: 2.4 10*3/uL (ref 0.7–4.0)
MCH: 31.5 pg (ref 26.0–34.0)
MCHC: 35.6 g/dL (ref 30.0–36.0)
MCV: 88.5 fL (ref 80.0–100.0)
Monocytes Absolute: 1.8 10*3/uL — ABNORMAL HIGH (ref 0.1–1.0)
Monocytes Relative: 13 %
Neutro Abs: 9.4 10*3/uL — ABNORMAL HIGH (ref 1.7–7.7)
Neutrophils Relative %: 68 %
Platelets: 422 10*3/uL — ABNORMAL HIGH (ref 150–400)
RBC: 2 MIL/uL — ABNORMAL LOW (ref 4.22–5.81)
RDW: 17.7 % — ABNORMAL HIGH (ref 11.5–15.5)
WBC: 13.9 10*3/uL — ABNORMAL HIGH (ref 4.0–10.5)
nRBC: 1.4 % — ABNORMAL HIGH (ref 0.0–0.2)

## 2020-10-30 LAB — COMPREHENSIVE METABOLIC PANEL
ALT: 11 U/L (ref 0–44)
AST: 15 U/L (ref 15–41)
Albumin: 3.2 g/dL — ABNORMAL LOW (ref 3.5–5.0)
Alkaline Phosphatase: 78 U/L (ref 38–126)
Anion gap: 9 (ref 5–15)
BUN: 11 mg/dL (ref 6–20)
CO2: 30 mmol/L (ref 22–32)
Calcium: 8.6 mg/dL — ABNORMAL LOW (ref 8.9–10.3)
Chloride: 98 mmol/L (ref 98–111)
Creatinine, Ser: 0.6 mg/dL — ABNORMAL LOW (ref 0.61–1.24)
GFR, Estimated: >60 mL/min (ref >60)
Glucose, Bld: 90 mg/dL (ref 70–99)
Potassium: 3.4 mmol/L — ABNORMAL LOW (ref 3.5–5.1)
Sodium: 137 mmol/L (ref 135–145)
Total Bilirubin: 3.8 mg/dL — ABNORMAL HIGH (ref 0.3–1.2)
Total Protein: 6.5 g/dL (ref 6.5–8.1)

## 2020-10-30 NOTE — Discharge Summary (Signed)
Physician Discharge Summary  Patient ID: Matthew Frederick MRN: [id number] DOB/AGE: February 20, 2001 20 y.o.  Admit date: 10/26/2020 Discharge date: 10/30/2020  Admission Diagnoses:  Discharge Diagnoses:  Principal Problem:   Sickle cell pain crisis [facility]) Active Problems:   Fever of unknown origin   Musculoskeletal chest pain   Discharged Condition: good  Hospital Course: Patient admitted with sickle cell painful crisis.  Initiated on Dilaudid PCA oral oxycodone, Tylenol and empiric antibiotics.  He had fever of unknown origin with chest x-ray negative urinalysis negative but patient had elevated temperature sinus tachycardia all presumed to be secondary to sickle cell crisis.  He has done better.  Felt much better.  Patient was subsequently discharged home on no antibiotics.  Mainly home regimen.  He has done well in the hospital and will resume care with his physician.  His hemoglobin has dropped some to 6.5 but appears to be within his range.  No transfusion was given  Consults: None  Significant Diagnostic Studies: labs: Serial CBC and CMP is checked.  No transfusion was given.  Treatments: IV hydration and analgesia: Dilaudid  Discharge Exam: Blood pressure 125/76, pulse (!) 108, temperature 99.1 F (37.3 C), temperature source Oral, resp. rate 17, height 5\' 10"  (1.778 m), weight 58.5 kg, SpO2 99 %. General appearance: alert, cooperative and appears stated age Head: Normocephalic, without obvious abnormality, atraumatic Resp: clear to auscultation bilaterally Cardio: regular rate and rhythm, S1, S2 normal, no murmur, click, rub or gallop GI: soft, non-tender; bowel sounds normal; no masses,  no organomegaly Extremities: extremities normal, atraumatic, no cyanosis or edema Pulses: 2+ and symmetric Skin: Skin color, texture, turgor normal. No rashes or lesions  Disposition: Discharge disposition: 01-Home or Self Care       Discharge Instructions    Diet - low sodium  heart healthy   Complete by: As directed    Increase activity slowly   Complete by: As directed      Allergies as of 10/30/2020      Reactions   Omnipaque [iohexol] Itching      Medication List    TAKE these medications   acetaminophen 500 MG tablet Commonly known as: TYLENOL Take 2 tablets (1,000 mg total) by mouth every 6 (six) hours as needed for headache.   folic acid 1 MG tablet Commonly known as: FOLVITE Take 1 mg by mouth at bedtime.   HYDROcodone-acetaminophen 5-325 MG tablet Commonly known as: NORCO/VICODIN Take 1-2 tablets by mouth every 8 (eight) hours as needed for moderate pain.   hydroxyurea 500 MG capsule Commonly known as: HYDREA Take 1,500 mg by mouth at bedtime.   ketorolac 10 MG tablet Commonly known as: TORADOL Take 1 tablet (10 mg total) by mouth every 6 (six) hours as needed. What changed: reasons to take this   naproxen sodium 220 MG tablet Commonly known as: ALEVE Take 440 mg by mouth daily as needed (pain).   Oxycodone HCl 10 MG Tabs Take 1 tablet (10 mg total) by mouth every 4 (four) hours as needed. What changed:   how much to take  reasons to take this        Signed: Ejay Lashley,LAWAL 10/30/2020, 12:20 PM   Time spent 33 minutes

## 2020-10-30 NOTE — Plan of Care (Signed)
  Problem: Education: Goal: Knowledge of General Education information will improve Description: Including pain rating scale, medication(s)/side effects and non-pharmacologic comfort measures Outcome: Completed/Met   Problem: Health Behavior/Discharge Planning: Goal: Ability to manage health-related needs will improve Outcome: Completed/Met   Problem: Clinical Measurements: Goal: Ability to maintain clinical measurements within normal limits will improve Outcome: Completed/Met Goal: Will remain free from infection Outcome: Completed/Met Goal: Diagnostic test results will improve Outcome: Completed/Met Goal: Respiratory complications will improve Outcome: Completed/Met Goal: Cardiovascular complication will be avoided Outcome: Completed/Met   Problem: Activity: Goal: Risk for activity intolerance will decrease Outcome: Completed/Met   Problem: Nutrition: Goal: Adequate nutrition will be maintained Outcome: Completed/Met   Problem: Coping: Goal: Level of anxiety will decrease Outcome: Completed/Met   Problem: Elimination: Goal: Will not experience complications related to bowel motility Outcome: Completed/Met Goal: Will not experience complications related to urinary retention Outcome: Completed/Met   Problem: Pain Managment: Goal: General experience of comfort will improve Description: Patient's general experience of comfort will improve by 10/31/20 Outcome: Completed/Met   Problem: Skin Integrity: Goal: Risk for impaired skin integrity will decrease Outcome: Completed/Met   Problem: Safety: Goal: Ability to remain free from injury will improve Outcome: Completed/Met

## 2020-10-30 NOTE — Progress Notes (Addendum)
Received discharge orders at 1220. Pt left the unit at 1250. After the pt left lab called with a critical hemoglobin result of 6.3. Dr. Mikeal Hawthorne was notified and stated "he is at his baseline". However, on 2/17 in [facility] note the patients hemoglobin was 9.2 and she said that was his baseline. Since 4/17 his hemoglobin trended down to 7.8 on 4/18, then today 6.3.

## 2020-10-30 NOTE — Care Management Important Message (Signed)
Important Message  Patient Details IM Letter given to the Patient. Name: Matthew Frederick MRN: [id number] Date of Birth: [birth year]   Medicare Important Message Given:  Yes     Caren Macadam 10/30/2020, 10:07 AM

## 2020-10-31 ENCOUNTER — Telehealth: Payer: Self-pay

## 2020-10-31 NOTE — Telephone Encounter (Signed)
Transition Care Management Unsuccessful Follow-up Telephone Call  Date of discharge and from where:  04/169/2022 from [facility]  Attempts:  1st Attempt  Reason for unsuccessful TCM follow-up call:  Left voice message

## 2020-11-01 NOTE — Telephone Encounter (Signed)
Transition Care Management Unsuccessful Follow-up Telephone Call  Date of discharge and from where:  10/30/2020 from [facility]  Attempts:  2nd Attempt  Reason for unsuccessful TCM follow-up call:  Left voice message

## 2020-11-02 LAB — CULTURE, BLOOD (ROUTINE X 2)
Culture: NO GROWTH
Culture: NO GROWTH
Special Requests: ADEQUATE

## 2020-11-02 NOTE — Telephone Encounter (Signed)
Transition Care Management Unsuccessful Follow-up Telephone Call  Date of discharge and from where:  10/30/2020 from [facility]  Attempts:  3rd Attempt  Reason for unsuccessful TCM follow-up call:  Unable to reach patient

## 2020-12-13 ENCOUNTER — Emergency Department (HOSPITAL_COMMUNITY): Payer: Medicare Other

## 2020-12-13 ENCOUNTER — Encounter (HOSPITAL_COMMUNITY): Payer: Self-pay

## 2020-12-13 ENCOUNTER — Inpatient Hospital Stay (HOSPITAL_COMMUNITY)
Admission: EM | Admit: 2020-12-13 | Discharge: 2020-12-16 | DRG: 811 | Disposition: A | Discharge status: Home / Self Care | Payer: Medicare Other | Attending: Internal Medicine | Admitting: Internal Medicine

## 2020-12-13 ENCOUNTER — Other Ambulatory Visit: Payer: Self-pay

## 2020-12-13 DIAGNOSIS — G894 Chronic pain syndrome: Secondary | ICD-10-CM | POA: Diagnosis present

## 2020-12-13 DIAGNOSIS — Z91041 Radiographic dye allergy status: Secondary | ICD-10-CM

## 2020-12-13 DIAGNOSIS — Z833 Family history of diabetes mellitus: Secondary | ICD-10-CM | POA: Diagnosis not present

## 2020-12-13 DIAGNOSIS — U071 COVID-19: Secondary | ICD-10-CM | POA: Diagnosis present

## 2020-12-13 DIAGNOSIS — D638 Anemia in other chronic diseases classified elsewhere: Secondary | ICD-10-CM | POA: Diagnosis present

## 2020-12-13 DIAGNOSIS — Z79899 Other long term (current) drug therapy: Secondary | ICD-10-CM | POA: Diagnosis not present

## 2020-12-13 DIAGNOSIS — R0602 Shortness of breath: Secondary | ICD-10-CM | POA: Diagnosis not present

## 2020-12-13 DIAGNOSIS — Z28310 Unvaccinated for covid-19: Secondary | ICD-10-CM

## 2020-12-13 DIAGNOSIS — E876 Hypokalemia: Secondary | ICD-10-CM | POA: Diagnosis not present

## 2020-12-13 DIAGNOSIS — D5700 Hb-SS disease with crisis, unspecified: Principal | ICD-10-CM | POA: Diagnosis present

## 2020-12-13 DIAGNOSIS — E860 Dehydration: Secondary | ICD-10-CM | POA: Diagnosis present

## 2020-12-13 DIAGNOSIS — Z832 Family history of diseases of the blood and blood-forming organs and certain disorders involving the immune mechanism: Secondary | ICD-10-CM

## 2020-12-13 LAB — RESP PANEL BY RT-PCR (FLU A&B, COVID) ARPGX2
Influenza A by PCR: NEGATIVE
Influenza B by PCR: NEGATIVE
SARS Coronavirus 2 by RT PCR: POSITIVE — AB

## 2020-12-13 LAB — COMPREHENSIVE METABOLIC PANEL
ALT: 10 U/L (ref 0–44)
AST: 24 U/L (ref 15–41)
Albumin: 4.2 g/dL (ref 3.5–5.0)
Alkaline Phosphatase: 122 U/L (ref 38–126)
Anion gap: 9 (ref 5–15)
BUN: 10 mg/dL (ref 6–20)
CO2: 24 mmol/L (ref 22–32)
Calcium: 9.3 mg/dL (ref 8.9–10.3)
Chloride: 106 mmol/L (ref 98–111)
Creatinine, Ser: 0.72 mg/dL (ref 0.61–1.24)
GFR, Estimated: >60 mL/min (ref >60)
Glucose, Bld: 103 mg/dL — ABNORMAL HIGH (ref 70–99)
Potassium: 3.6 mmol/L (ref 3.5–5.1)
Sodium: 139 mmol/L (ref 135–145)
Total Bilirubin: 4.3 mg/dL — ABNORMAL HIGH (ref 0.3–1.2)
Total Protein: 8 g/dL (ref 6.5–8.1)

## 2020-12-13 LAB — CBC WITH DIFFERENTIAL/PLATELET
Abs Immature Granulocytes: 1.02 10*3/uL — ABNORMAL HIGH (ref 0.00–0.07)
Abs Immature Granulocytes: 0.97 10*3/uL — ABNORMAL HIGH (ref 0.00–0.07)
Basophils Absolute: 0.2 10*3/uL — ABNORMAL HIGH (ref 0.0–0.1)
Basophils Absolute: 0.1 10*3/uL (ref 0.0–0.1)
Basophils Relative: 1 %
Basophils Relative: 1 %
Eosinophils Absolute: 0.4 10*3/uL (ref 0.0–0.5)
Eosinophils Absolute: 0 10*3/uL (ref 0.0–0.5)
Eosinophils Relative: 2 %
Eosinophils Relative: 0 %
HCT: 28.2 % — ABNORMAL LOW (ref 39.0–52.0)
HCT: 26.8 % — ABNORMAL LOW (ref 39.0–52.0)
Hemoglobin: 10.2 g/dL — ABNORMAL LOW (ref 13.0–17.0)
Hemoglobin: 9.5 g/dL — ABNORMAL LOW (ref 13.0–17.0)
Immature Granulocytes: 5 %
Immature Granulocytes: 5 %
Lymphocytes Relative: 28 %
Lymphocytes Relative: 6 %
Lymphs Abs: 5.4 10*3/uL — ABNORMAL HIGH (ref 0.7–4.0)
Lymphs Abs: 1.1 10*3/uL (ref 0.7–4.0)
MCH: 32 pg (ref 26.0–34.0)
MCH: 31.8 pg (ref 26.0–34.0)
MCHC: 36.2 g/dL — ABNORMAL HIGH (ref 30.0–36.0)
MCHC: 35.4 g/dL (ref 30.0–36.0)
MCV: 88.4 fL (ref 80.0–100.0)
MCV: 89.6 fL (ref 80.0–100.0)
Monocytes Absolute: 1.8 10*3/uL — ABNORMAL HIGH (ref 0.1–1.0)
Monocytes Absolute: 2.4 10*3/uL — ABNORMAL HIGH (ref 0.1–1.0)
Monocytes Relative: 9 %
Monocytes Relative: 13 %
Neutro Abs: 10.7 10*3/uL — ABNORMAL HIGH (ref 1.7–7.7)
Neutro Abs: 14.3 10*3/uL — ABNORMAL HIGH (ref 1.7–7.7)
Neutrophils Relative %: 55 %
Neutrophils Relative %: 75 %
Platelets: 569 10*3/uL — ABNORMAL HIGH (ref 150–400)
Platelets: 484 10*3/uL — ABNORMAL HIGH (ref 150–400)
RBC: 3.19 MIL/uL — ABNORMAL LOW (ref 4.22–5.81)
RBC: 2.99 MIL/uL — ABNORMAL LOW (ref 4.22–5.81)
RDW: 20.8 % — ABNORMAL HIGH (ref 11.5–15.5)
RDW: 19.9 % — ABNORMAL HIGH (ref 11.5–15.5)
WBC: 19.5 10*3/uL — ABNORMAL HIGH (ref 4.0–10.5)
WBC: 18.9 10*3/uL — ABNORMAL HIGH (ref 4.0–10.5)
nRBC: 3.1 % — ABNORMAL HIGH (ref 0.0–0.2)
nRBC: 7.8 % — ABNORMAL HIGH (ref 0.0–0.2)

## 2020-12-13 LAB — TROPONIN I (HIGH SENSITIVITY)
Troponin I (High Sensitivity): 7 ng/L (ref <18)
Troponin I (High Sensitivity): 5 ng/L (ref <18)
Troponin I (High Sensitivity): 6 ng/L (ref <18)

## 2020-12-13 LAB — CREATININE, SERUM
Creatinine, Ser: 0.65 mg/dL (ref 0.61–1.24)
GFR, Estimated: >60 mL/min (ref >60)

## 2020-12-13 LAB — RETICULOCYTES
Immature Retic Fract: 33.4 % — ABNORMAL HIGH (ref 2.3–15.9)
RBC.: 3.22 MIL/uL — ABNORMAL LOW (ref 4.22–5.81)
Retic Count, Absolute: 516.5 10*3/uL — ABNORMAL HIGH (ref 19.0–186.0)
Retic Ct Pct: 16.9 % — ABNORMAL HIGH (ref 0.4–3.1)

## 2020-12-13 LAB — D-DIMER, QUANTITATIVE: D-Dimer, Quant: 3.6 ug/mL-FEU — ABNORMAL HIGH (ref 0.00–0.50)

## 2020-12-13 MED ORDER — HYDROMORPHONE HCL 2 MG/ML IJ SOLN
2.0000 mg | INTRAMUSCULAR | Status: AC
  Administered 2020-12-13: 2 mg via INTRAVENOUS
  Filled 2020-12-13: qty 1

## 2020-12-13 MED ORDER — DIPHENHYDRAMINE HCL 12.5 MG/5ML PO ELIX
12.5000 mg | ORAL_SOLUTION | Freq: Four times a day (QID) | ORAL | Status: DC | PRN
  Administered 2020-12-14: 12.5 mg via ORAL
  Filled 2020-12-13: qty 5

## 2020-12-13 MED ORDER — KETOROLAC TROMETHAMINE 30 MG/ML IJ SOLN
30.0000 mg | Freq: Four times a day (QID) | INTRAMUSCULAR | Status: DC
  Administered 2020-12-13 – 2020-12-16 (×12): 30 mg via INTRAVENOUS
  Filled 2020-12-13 (×12): qty 1

## 2020-12-13 MED ORDER — HYDROMORPHONE HCL 2 MG/ML IJ SOLN
2.0000 mg | INTRAMUSCULAR | Status: DC | PRN
  Administered 2020-12-13 (×4): 2 mg via INTRAVENOUS
  Filled 2020-12-13 (×4): qty 1

## 2020-12-13 MED ORDER — SODIUM CHLORIDE 0.9% FLUSH: 9.0000 mL | INTRAVENOUS | Status: DC | PRN

## 2020-12-13 MED ORDER — SENNOSIDES-DOCUSATE SODIUM 8.6-50 MG PO TABS
1.0000 | ORAL_TABLET | Freq: Two times a day (BID) | ORAL | Status: DC
  Administered 2020-12-13 – 2020-12-16 (×7): 1 via ORAL
  Filled 2020-12-13 (×6): qty 1

## 2020-12-13 MED ORDER — HYDROMORPHONE HCL 2 MG/ML IJ SOLN
2.0000 mg | Freq: Once | INTRAMUSCULAR | Status: AC
Start: 2020-12-13 — End: 2020-12-13
  Administered 2020-12-13: 2 mg via INTRAVENOUS
  Filled 2020-12-13: qty 1

## 2020-12-13 MED ORDER — DEXTROSE-NACL 5-0.45 % IV SOLN: INTRAVENOUS | Status: DC

## 2020-12-13 MED ORDER — DIPHENHYDRAMINE HCL 50 MG/ML IJ SOLN
12.5000 mg | Freq: Four times a day (QID) | INTRAMUSCULAR | Status: DC | PRN
  Administered 2020-12-15: 12.5 mg via INTRAVENOUS
  Filled 2020-12-13: qty 1

## 2020-12-13 MED ORDER — TECHNETIUM TO 99M ALBUMIN AGGREGATED
4.4000 | Freq: Once | INTRAVENOUS | Status: AC
  Administered 2020-12-13: 4.4 via INTRAVENOUS

## 2020-12-13 MED ORDER — KETOROLAC TROMETHAMINE 15 MG/ML IJ SOLN
15.0000 mg | INTRAMUSCULAR | Status: AC
  Administered 2020-12-13: 15 mg via INTRAVENOUS
  Filled 2020-12-13: qty 1

## 2020-12-13 MED ORDER — POLYETHYLENE GLYCOL 3350 17 G PO PACK: 17.0000 g | PACK | Freq: Every day | ORAL | Status: DC | PRN

## 2020-12-13 MED ORDER — DIPHENHYDRAMINE HCL 50 MG/ML IJ SOLN
25.0000 mg | Freq: Once | INTRAMUSCULAR | Status: AC
  Administered 2020-12-13: 25 mg via INTRAVENOUS
  Filled 2020-12-13: qty 1

## 2020-12-13 MED ORDER — NALOXONE HCL 0.4 MG/ML IJ SOLN: 0.4000 mg | INTRAMUSCULAR | Status: DC | PRN

## 2020-12-13 MED ORDER — ENOXAPARIN SODIUM 40 MG/0.4ML IJ SOSY
40.0000 mg | PREFILLED_SYRINGE | INTRAMUSCULAR | Status: DC
  Administered 2020-12-13 – 2020-12-15 (×3): 40 mg via SUBCUTANEOUS
  Filled 2020-12-13 (×3): qty 0.4

## 2020-12-13 MED ORDER — ONDANSETRON HCL 4 MG/2ML IJ SOLN: 4.0000 mg | Freq: Four times a day (QID) | INTRAMUSCULAR | Status: DC | PRN

## 2020-12-13 MED ORDER — HYDROMORPHONE 1 MG/ML IV SOLN
INTRAVENOUS | Status: DC
  Administered 2020-12-14: 30 mg via INTRAVENOUS
  Administered 2020-12-15: 2.5 mg via INTRAVENOUS
  Administered 2020-12-15: 10.5 mg via INTRAVENOUS
  Administered 2020-12-15: 1 mg via INTRAVENOUS
  Administered 2020-12-15: 30 mg via INTRAVENOUS
  Administered 2020-12-16: 3 mg via INTRAVENOUS
  Filled 2020-12-13 (×2): qty 30

## 2020-12-13 NOTE — ED Provider Notes (Signed)
[facility]-EMERGENCY DEPT Provider Note   CSN: [id number] Arrival date & time: 12/13/20  8756     History Chief Complaint  Patient presents with  . Sickle Cell Pain Crisis    Matthew Frederick is a 20 y.o. male.  HPI 20 year old male with a history of sickle cell anemia presents to the ER with complaints of sickle cell crisis.  Patient reports pain "all over from head to toe", which is abnormal for him.  Normally his crises of pain in his right arm.  He also complains of chest pain and shortness of breath.  Endorses history of acute chest.  He reports waking up at 5 AM with cute onset of this pain.  He took some little at home but did not use any of his opioid medications today.  He denies any prior history of DVT.  Denies any fevers or chills.    Past Medical History:  Diagnosis Date  . Sickle cell anemia [facility])     Patient Active Problem List   Diagnosis Date Noted  . Acute intractable headache 05/28/2020  . Chronic pain syndrome 05/28/2020  . Sickle cell pain crisis (HCC) 01/21/2020  . Anemia   . Sickle-cell crisis (HCC) 09/03/2019  . Sickle cell anemia with pain (HCC) 08/05/2019  . Abnormal liver function 04/02/2019  . Thrombocytopenia (HCC) 11/24/2018  . Leukocytosis 11/22/2018  . Musculoskeletal chest pain   . Coccyx pain   . Vasoocclusive sickle cell crisis (HCC)   . Fever of unknown origin   . Sickle cell crisis (HCC) 07/23/2016  . Transition of care performed with sharing of clinical summary 04/28/2016  . Need for immunization against influenza 04/22/2012    History reviewed. No pertinent surgical history.     Family History  Problem Relation Age of Onset  . Sickle cell anemia Father   . Diabetes Maternal Grandmother     Social History   Tobacco Use  . Smoking status: Never Smoker  . Smokeless tobacco: Never Used  Vaping Use  . Vaping Use: Never used  Substance Use Topics  . Alcohol use: No  . Drug use: No    Home  Medications Prior to Admission medications   Medication Sig Start Date End Date Taking? Authorizing Provider  acetaminophen (TYLENOL) 500 MG tablet Take 2 tablets (1,000 mg total) by mouth every 6 (six) hours as needed for headache. 06/01/20   Massie Maroon, FNP  folic acid (FOLVITE) 1 MG tablet Take 1 mg by mouth at bedtime.     [provider]  HYDROcodone-acetaminophen (NORCO/VICODIN) 5-325 MG tablet Take 1-2 tablets by mouth every 8 (eight) hours as needed for moderate pain.    [provider]  hydroxyurea (HYDREA) 500 MG capsule Take 1,500 mg by mouth at bedtime.     [provider]  ketorolac (TORADOL) 10 MG tablet Take 1 tablet (10 mg total) by mouth every 6 (six) hours as needed. Patient taking differently: Take 10 mg by mouth every 6 (six) hours as needed for moderate pain. 10/02/20   Koleen Distance, MD  naproxen sodium (ALEVE) 220 MG tablet Take 440 mg by mouth daily as needed (pain).    [provider]  Oxycodone HCl 10 MG TABS Take 1 tablet (10 mg total) by mouth every 4 (four) hours as needed. 10/02/20   Koleen Distance, MD    Allergies    Omnipaque [iohexol]  Review of Systems   Review of Systems  Constitutional: Negative for chills  and fever.  HENT: Negative for ear pain and sore throat.   Eyes: Negative for pain and visual disturbance.  Respiratory: Positive for shortness of breath. Negative for cough.   Cardiovascular: Positive for chest pain. Negative for palpitations.  Gastrointestinal: Negative for abdominal pain and vomiting.  Genitourinary: Negative for dysuria and hematuria.  Musculoskeletal: Negative for arthralgias and back pain.  Skin: Negative for color change and rash.  Neurological: Negative for seizures and syncope.  All other systems reviewed and are negative.   Physical Exam Updated Vital Signs BP 116/76 (BP Location: Right Arm)   Pulse 77   Temp 98.3 F (36.8 C) (Oral)   Resp 12   Ht 5\' 10"  (1.778 m)   Wt  59.4 kg   SpO2 100%   BMI 18.80 kg/m   Physical Exam Vitals and nursing note reviewed.  Constitutional:      Appearance: He is well-developed.     Comments: Patient curled up in a ball, moaning  HENT:     Head: Normocephalic and atraumatic.  Eyes:     Conjunctiva/sclera: Conjunctivae normal.  Cardiovascular:     Rate and Rhythm: Normal rate and regular rhythm.     Heart sounds: No murmur heard.   Pulmonary:     Effort: No respiratory distress.     Breath sounds: Normal breath sounds.     Comments: Tachypneic Abdominal:     Palpations: Abdomen is soft.     Tenderness: There is no abdominal tenderness.  Musculoskeletal:     Cervical back: Neck supple.  Skin:    General: Skin is warm and dry.  Neurological:     Mental Status: He is alert.     ED Results / Procedures / Treatments   Labs (all labs ordered are listed, but only abnormal results are displayed) Labs Reviewed  COMPREHENSIVE METABOLIC PANEL - Abnormal; Notable for the following components:      Result Value   Glucose, Bld 103 (*)    Total Bilirubin 4.3 (*)    All other components within normal limits  CBC WITH DIFFERENTIAL/PLATELET - Abnormal; Notable for the following components:   WBC 19.5 (*)    RBC 3.19 (*)    Hemoglobin 10.2 (*)    HCT 28.2 (*)    MCHC 36.2 (*)    RDW 20.8 (*)    Platelets 569 (*)    nRBC 3.1 (*)    Neutro Abs 10.7 (*)    Lymphs Abs 5.4 (*)    Monocytes Absolute 1.8 (*)    Basophils Absolute 0.2 (*)    Abs Immature Granulocytes 1.02 (*)    All other components within normal limits  RETICULOCYTES - Abnormal; Notable for the following components:   Retic Ct Pct 16.9 (*)    RBC. 3.22 (*)    Retic Count, Absolute 516.5 (*)    Immature Retic Fract 33.4 (*)    All other components within normal limits  D-DIMER, QUANTITATIVE - Abnormal; Notable for the following components:   D-Dimer, Quant 3.60 (*)    All other components within normal limits  TROPONIN I (HIGH SENSITIVITY)   TROPONIN I (HIGH SENSITIVITY)    EKG None  Radiology NM Pulmonary Perfusion  Result Date: 12/13/2020 CLINICAL DATA:  Suspected. EXAM: NUCLEAR MEDICINE PERFUSION LUNG SCAN TECHNIQUE: Perfusion images were obtained in multiple projections after intravenous injection of radiopharmaceutical. Ventilation scans intentionally deferred if perfusion scan and chest x-ray adequate for interpretation during COVID 19 epidemic. RADIOPHARMACEUTICALS:  4.4 mCi Tc-9m MAA  IV COMPARISON:  Chest x-ray 12/13/2020. FINDINGS: No significant perfusion defects noted. No evidence of pulmonary embolus. IMPRESSION: No evidence of pulmonary embolus. Electronically Signed   By: Maisie Fus  Register   On: 12/13/2020 11:21   DG Chest Portable 1 View  Result Date: 12/13/2020 CLINICAL DATA:  Chest pain.  Sickle cell disease. EXAM: PORTABLE CHEST 1 VIEW COMPARISON:  October 28, 2020 FINDINGS: Lungs are clear. Heart size and pulmonary vascularity are normal. No adenopathy. Sclerotic foci in several bony structures consistent with known sickle cell disease. Multiple endplate infarcts in the thoracic spine are stable. IMPRESSION: Lungs clear. Cardiac silhouette within normal limits. Scattered bony changes indicative of sickle cell disease. Multiple endplate infarcts in the thoracic spine consistent with sickle cell disease. Electronically Signed   By: Bretta Bang III M.D.   On: 12/13/2020 09:03    Procedures Procedures   Medications Ordered in ED Medications  HYDROmorphone (DILAUDID) injection 2 mg (has no administration in time range)  HYDROmorphone (DILAUDID) injection 2 mg (2 mg Intravenous Given 12/13/20 0809)  HYDROmorphone (DILAUDID) injection 2 mg (2 mg Intravenous Given 12/13/20 0859)  ketorolac (TORADOL) 15 MG/ML injection 15 mg (15 mg Intravenous Given 12/13/20 0809)  diphenhydrAMINE (BENADRYL) injection 25 mg (25 mg Intravenous Given 12/13/20 0809)  technetium albumin aggregated (MAA) injection solution 4.4 millicurie (4.4  millicuries Intravenous Contrast Given 12/13/20 1100)    ED Course  I have reviewed the triage vital signs and the nursing notes.  Pertinent labs & imaging results that were available during my care of the patient were reviewed by me and considered in my medical decision making (see chart for details).    MDM Rules/Calculators/A&P                          20 year old male presents to the ER with complaints of sickle cell crisis.  On arrival, he is uncomfortable appearing, tachypneic on arrival.  Vitals overall reassuring.  Lung sounds clear.  Labs and imaging ordered, reviewed and interpreted by me.  CBC with a leukocytosis of 19.5, consistent with sickle cell crisis CMP without any significant abnormalities.  Elevated reticulocyte count at 16.9.  Initial troponin of 7.  D-dimer of 3.6.  Chest x-ray without evidence of acute chest syndrome.  Given presentation of tachypnea, and complaints of shortness of breath and chest pain, there is still concern for possible PE.  Patient has a documented allergy to dye, however he denies this.  Plan for VQ scan.  Patient received 2 rounds of Dilaudid, Toradol with some improvement in his pain, stating this is currently 7/10.  Patient like to be admitted for further pain management.  Consulted hospitalist team.  VQ scan negative for PE.  Spoke with Dr. Mikeal Hawthorne with the sickle cell team who will admit the patient for further treatment.  Patient stable for admission. Final Clinical Impression(s) / ED Diagnoses Final diagnoses:  Sickle cell pain crisis [facility])    Rx / DC Orders ED Discharge Orders    None       Mare Ferrari, PA-C 12/13/20 1144    Derwood Kaplan, MD 12/14/20 [phone]

## 2020-12-13 NOTE — ED Triage Notes (Signed)
Patient c/o sickle cell pain "everywhere" since 0500 today.

## 2020-12-13 NOTE — ED Notes (Signed)
Patient given meal tray.

## 2020-12-13 NOTE — H&P (Signed)
History and Physical   Matthew Frederick WUJ:[id number] DOB: June 23, 2001 DOA: 12/13/2020  Referring MD/NP/PA: Reita Cliche, PA  PCP: [name], MD   Outpatient Specialists: None  Patient coming from: Home  Chief Complaint: Low back pain  HPI: Matthew Frederick is a 20 y.o. male with medical history significant of sickle cell disease with anemia and frequent hospitalizations who presents to the ER with complaint of low back pain.  He started with pain all over from head to toe.  This is abnormal.  He typically gets crisis involving the right arm.  He woke up at 5 AM with acute onset of his pain.  Took his home medication but no relief.  He denied any fever or chills.  Denied any cough or shortness of breath.  Patient was seen in the ER and treated with up to 6 mg of IV Dilaudid with no relief.  Subsequently admitted with sickle cell painful crisis.  After admission patient noted to have tested positive for COVID-19.  He is asymptomatic.  He is still being admitted for pain management..  ED Course: Temperature 98.3 blood pressure 106/69, pulse of 93 respiratory rate of 21 oxygen sat 95% on room air.  White count 18.9 hemoglobin 9.5 platelets 484.  Chemistry largely within normal.  COVID-19 positive.Troponin of 6.  Patient being admitted with sickle cell painful crisis.  Review of Systems: As per HPI otherwise 10 point review of systems negative.    Past Medical History:  Diagnosis Date  . Sickle cell anemia (HCC)     History reviewed. No pertinent surgical history.   reports that he has never smoked. He has never used smokeless tobacco. He reports that he does not drink alcohol and does not use drugs.  Allergies  Allergen Reactions  . Omnipaque [Iohexol] Itching    Family History  Problem Relation Age of Onset  . Sickle cell anemia Father   . Diabetes Maternal Grandmother      Prior to Admission medications   Medication Sig Start Date End Date Taking? Authorizing  Provider  acetaminophen (TYLENOL) 500 MG tablet Take 2 tablets (1,000 mg total) by mouth every 6 (six) hours as needed for headache. 06/01/20   Massie Maroon, FNP  folic acid (FOLVITE) 1 MG tablet Take 1 mg by mouth at bedtime.     [provider]  HYDROcodone-acetaminophen (NORCO/VICODIN) 5-325 MG tablet Take 1-2 tablets by mouth every 8 (eight) hours as needed for moderate pain.    [provider]  hydroxyurea (HYDREA) 500 MG capsule Take 1,500 mg by mouth at bedtime.     [provider]  ketorolac (TORADOL) 10 MG tablet Take 1 tablet (10 mg total) by mouth every 6 (six) hours as needed. Patient taking differently: Take 10 mg by mouth every 6 (six) hours as needed for moderate pain. 10/02/20   Koleen Distance, MD  naproxen sodium (ALEVE) 220 MG tablet Take 440 mg by mouth daily as needed (pain).    [provider]  Oxycodone HCl 10 MG TABS Take 1 tablet (10 mg total) by mouth every 4 (four) hours as needed. 10/02/20   Koleen Distance, MD    Physical Exam: Vitals:   12/13/20 1830 12/13/20 1900 12/13/20 1930 12/13/20 2000  BP: 108/70 110/70 118/71 118/78  Pulse: 92 77 73 70  Resp: 17  15 16   Temp:      TempSrc:      SpO2: 100% 100% 99% 100%  Weight:  Height:          Constitutional: NAD, calm, comfortable Vitals:   12/13/20 1830 12/13/20 1900 12/13/20 1930 12/13/20 2000  BP: 108/70 110/70 118/71 118/78  Pulse: 92 77 73 70  Resp: 17  15 16   Temp:      TempSrc:      SpO2: 100% 100% 99% 100%  Weight:      Height:       Eyes: PERRL, lids and conjunctivae normal ENMT: Mucous membranes are moist. Posterior pharynx clear of any exudate or lesions.Normal dentition.  Neck: normal, supple, no masses, no thyromegaly Respiratory: clear to auscultation bilaterally, no wheezing, no crackles. Normal respiratory effort. No accessory muscle use.  Cardiovascular: Regular rate and rhythm, no murmurs / rubs / gallops. No extremity edema. 2+ pedal pulses.  No carotid bruits.  Abdomen: no tenderness, no masses palpated. No hepatosplenomegaly. Bowel sounds positive.  Musculoskeletal: no clubbing / cyanosis. No joint deformity upper and lower extremities. Good ROM, no contractures. Normal muscle tone.  Skin: no rashes, lesions, ulcers. No induration Neurologic: CN 2-12 grossly intact. Sensation intact, DTR normal. Strength 5/5 in all 4.  Psychiatric: Normal judgment and insight. Alert and oriented x 3. Normal mood.     Labs on Admission: I have personally reviewed following labs and imaging studies  CBC: Recent Labs  Lab 12/13/20 0755 12/13/20 1539  WBC 19.5* 18.9*  NEUTROABS 10.7* 14.3*  HGB 10.2* 9.5*  HCT 28.2* 26.8*  MCV 88.4 89.6  PLT 569* 484*   Basic Metabolic Panel: Recent Labs  Lab 12/13/20 0755 12/13/20 1539  NA 139  --   K 3.6  --   CL 106  --   CO2 24  --   GLUCOSE 103*  --   BUN 10  --   CREATININE 0.72 0.65  CALCIUM 9.3  --    GFR: Estimated Creatinine Clearance: 123.8 mL/min (by C-G formula based on SCr of 0.65 mg/dL). Liver Function Tests: Recent Labs  Lab 12/13/20 0755  AST 24  ALT 10  ALKPHOS 122  BILITOT 4.3*  PROT 8.0  ALBUMIN 4.2   No results for input(s): LIPASE, AMYLASE in the last 168 hours. No results for input(s): AMMONIA in the last 168 hours. Coagulation Profile: No results for input(s): INR, PROTIME in the last 168 hours. Cardiac Enzymes: No results for input(s): CKTOTAL, CKMB, CKMBINDEX, TROPONINI in the last 168 hours. BNP (last 3 results) No results for input(s): PROBNP in the last 8760 hours. HbA1C: No results for input(s): HGBA1C in the last 72 hours. CBG: No results for input(s): GLUCAP in the last 168 hours. Lipid Profile: No results for input(s): CHOL, HDL, LDLCALC, TRIG, CHOLHDL, LDLDIRECT in the last 72 hours. Thyroid Function Tests: No results for input(s): TSH, T4TOTAL, FREET4, T3FREE, THYROIDAB in the last 72 hours. Anemia Panel: Recent Labs    12/13/20 0755   RETICCTPCT 16.9*   Urine analysis:    Component Value Date/Time   COLORURINE YELLOW 10/28/2020 1319   APPEARANCEUR CLEAR 10/28/2020 1319   LABSPEC 1.011 10/28/2020 1319   PHURINE 6.0 10/28/2020 1319   GLUCOSEU NEGATIVE 10/28/2020 1319   HGBUR NEGATIVE 10/28/2020 1319   BILIRUBINUR NEGATIVE 10/28/2020 1319   KETONESUR 20 (A) 10/28/2020 1319   PROTEINUR NEGATIVE 10/28/2020 1319   NITRITE NEGATIVE 10/28/2020 1319   LEUKOCYTESUR NEGATIVE 10/28/2020 1319   Sepsis Labs: @LABRCNTIP (procalcitonin:4,lacticidven:4) ) Recent Results (from the past 240 hour(s))  Resp Panel by RT-PCR (Flu A&B, Covid) Nasopharyngeal Swab     Status:  Abnormal   Collection Time: 12/13/20 12:28 PM   Specimen: Nasopharyngeal Swab; Nasopharyngeal(NP) swabs in vial transport medium  Result Value Ref Range Status   SARS Coronavirus 2 by RT PCR POSITIVE (A) NEGATIVE Final    Comment: RESULT CALLED TO, READ BACK BY AND VERIFIED WITH: WEST,S. RN AT 1332 12/13/20 MULLINS,T (NOTE) SARS-CoV-2 target nucleic acids are DETECTED.  The SARS-CoV-2 RNA is generally detectable in upper respiratory specimens during the acute phase of infection. Positive results are indicative of the presence of the identified virus, but do not rule out bacterial infection or co-infection with other pathogens not detected by the test. Clinical correlation with patient history and other diagnostic information is necessary to determine patient infection status. The expected result is Negative.  Fact Sheet for Patients: BloggerCourse.com  Fact Sheet for Healthcare Providers: SeriousBroker.it  This test is not yet approved or cleared by the [place] FDA and  has been authorized for detection and/or diagnosis of SARS-CoV-2 by FDA under an Emergency Use Authorization (EUA).  This EUA will remain in effect (meaning this test ca n be used) for the duration of  the COVID-19 declaration  under Section 564(b)(1) of the Act, 21 U.S.C. section 360bbb-3(b)(1), unless the authorization is terminated or revoked sooner.     Influenza A by PCR NEGATIVE NEGATIVE Final   Influenza B by PCR NEGATIVE NEGATIVE Final    Comment: (NOTE) The Xpert Xpress SARS-CoV-2/FLU/RSV plus assay is intended as an aid in the diagnosis of influenza from Nasopharyngeal swab specimens and should not be used as a sole basis for treatment. Nasal washings and aspirates are unacceptable for Xpert Xpress SARS-CoV-2/FLU/RSV testing.  Fact Sheet for Patients: BloggerCourse.com  Fact Sheet for Healthcare Providers: SeriousBroker.it  This test is not yet approved or cleared by the [place] FDA and has been authorized for detection and/or diagnosis of SARS-CoV-2 by FDA under an Emergency Use Authorization (EUA). This EUA will remain in effect (meaning this test can be used) for the duration of the COVID-19 declaration under Section 564(b)(1) of the Act, 21 U.S.C. section 360bbb-3(b)(1), unless the authorization is terminated or revoked.  Performed at [facility], [address], [place], [place] [address]      Radiological Exams on Admission: NM Pulmonary Perfusion  Result Date: 12/13/2020 CLINICAL DATA:  Suspected. EXAM: NUCLEAR MEDICINE PERFUSION LUNG SCAN TECHNIQUE: Perfusion images were obtained in multiple projections after intravenous injection of radiopharmaceutical. Ventilation scans intentionally deferred if perfusion scan and chest x-ray adequate for interpretation during COVID 19 epidemic. RADIOPHARMACEUTICALS:  4.4 mCi Tc-28m MAA IV COMPARISON:  Chest x-ray 12/13/2020. FINDINGS: No significant perfusion defects noted. No evidence of pulmonary embolus. IMPRESSION: No evidence of pulmonary embolus. Electronically Signed   By: Maisie Fus  Register   On: 12/13/2020 11:21   DG Chest Portable 1 View  Result Date:  12/13/2020 CLINICAL DATA:  Chest pain.  Sickle cell disease. EXAM: PORTABLE CHEST 1 VIEW COMPARISON:  October 28, 2020 FINDINGS: Lungs are clear. Heart size and pulmonary vascularity are normal. No adenopathy. Sclerotic foci in several bony structures consistent with known sickle cell disease. Multiple endplate infarcts in the thoracic spine are stable. IMPRESSION: Lungs clear. Cardiac silhouette within normal limits. Scattered bony changes indicative of sickle cell disease. Multiple endplate infarcts in the thoracic spine consistent with sickle cell disease. Electronically Signed   By: Bretta Bang III M.D.   On: 12/13/2020 09:03      Assessment/Plan Principal Problem:   Sickle cell anemia with crisis (HCC)  Active Problems:   Chronic pain syndrome   COVID-19 virus infection     #1 sickle cell painful crisis: Patient will be admitted.  Initiate Dilaudid PCA, Toradol and IV fluids.  Give Dilaudid 2 mg every 2 hours as needed while waiting for a bed.  We will resume home regimen as soon as practicable.  #2 COVID-19 infection: Asymptomatic so far.  Continue to monitor.  #3 anemia of chronic disease: Hemoglobin 9.5.  Continue to monitor  #4 leukocytosis: Chronic leukocytosis probably related to sickle cell crisis.  Continue to monitor   DVT prophylaxis: Lovenox Code Status: Full code Family Communication: No family at bedside Disposition Plan: Home Consults called: None Admission status: Inpatient  Severity of Illness: The appropriate patient status for this patient is INPATIENT. Inpatient status is judged to be reasonable and necessary in order to provide the required intensity of service to ensure the patient's safety. The patient's presenting symptoms, physical exam findings, and initial radiographic and laboratory data in the context of their chronic comorbidities is felt to place them at high risk for further clinical deterioration. Furthermore, it is not anticipated that the  patient will be medically stable for discharge from the hospital within 2 midnights of admission. The following factors support the patient status of inpatient.   " The patient's presenting symptoms include pain in lower back. " The worrisome physical exam findings include tenderness in the lower back. " The initial radiographic and laboratory data are worrisome because of negative chest x-ray. " The chronic co-morbidities include sickle cell disease.   * I certify that at the point of admission it is my clinical judgment that the patient will require inpatient hospital care spanning beyond 2 midnights from the point of admission due to high intensity of service, high risk for further deterioration and high frequency of surveillance required.Lonia Blood*    Everest Hacking,LAWAL MD [facility] Pager [phone]  If 7PM-7AM, please contact night-coverage [email or web address] Password [email or web address]  12/13/2020, 8:04 PM

## 2020-12-14 LAB — COMPREHENSIVE METABOLIC PANEL
ALT: 12 U/L (ref 0–44)
AST: 29 U/L (ref 15–41)
Albumin: 3.7 g/dL (ref 3.5–5.0)
Alkaline Phosphatase: 93 U/L (ref 38–126)
Anion gap: 6 (ref 5–15)
BUN: 8 mg/dL (ref 6–20)
CO2: 26 mmol/L (ref 22–32)
Calcium: 8.5 mg/dL — ABNORMAL LOW (ref 8.9–10.3)
Chloride: 102 mmol/L (ref 98–111)
Creatinine, Ser: 0.66 mg/dL (ref 0.61–1.24)
GFR, Estimated: >60 mL/min (ref >60)
Glucose, Bld: 109 mg/dL — ABNORMAL HIGH (ref 70–99)
Potassium: 3.9 mmol/L (ref 3.5–5.1)
Sodium: 134 mmol/L — ABNORMAL LOW (ref 135–145)
Total Bilirubin: 3.2 mg/dL — ABNORMAL HIGH (ref 0.3–1.2)
Total Protein: 7.1 g/dL (ref 6.5–8.1)

## 2020-12-14 LAB — CBC WITH DIFFERENTIAL/PLATELET
Abs Immature Granulocytes: 0.15 10*3/uL — ABNORMAL HIGH (ref 0.00–0.07)
Basophils Absolute: 0.1 10*3/uL (ref 0.0–0.1)
Basophils Relative: 0 %
Eosinophils Absolute: 0.1 10*3/uL (ref 0.0–0.5)
Eosinophils Relative: 1 %
HCT: 25.5 % — ABNORMAL LOW (ref 39.0–52.0)
Hemoglobin: 9.1 g/dL — ABNORMAL LOW (ref 13.0–17.0)
Immature Granulocytes: 1 %
Lymphocytes Relative: 10 %
Lymphs Abs: 1.4 10*3/uL (ref 0.7–4.0)
MCH: 31.9 pg (ref 26.0–34.0)
MCHC: 35.7 g/dL (ref 30.0–36.0)
MCV: 89.5 fL (ref 80.0–100.0)
Monocytes Absolute: 1.7 10*3/uL — ABNORMAL HIGH (ref 0.1–1.0)
Monocytes Relative: 12 %
Neutro Abs: 10.6 10*3/uL — ABNORMAL HIGH (ref 1.7–7.7)
Neutrophils Relative %: 76 %
Platelets: 390 10*3/uL (ref 150–400)
RBC: 2.85 MIL/uL — ABNORMAL LOW (ref 4.22–5.81)
RDW: 18.9 % — ABNORMAL HIGH (ref 11.5–15.5)
WBC: 10.4 10*3/uL (ref 4.0–10.5)
nRBC: 12.3 % — ABNORMAL HIGH (ref 0.0–0.2)

## 2020-12-14 MED ORDER — ORAL CARE MOUTH RINSE
15.0000 mL | Freq: Two times a day (BID) | OROMUCOSAL | Status: DC
  Administered 2020-12-14 – 2020-12-15 (×4): 15 mL via OROMUCOSAL

## 2020-12-14 NOTE — Progress Notes (Signed)
Subjective: A 20 year old gentleman with history of sickle cell disease who was admitted with sickle cell painful crisis.  Patient found to be COVID-19 positive but asymptomatic.  Pain (8 -9 out of 10 in his back and legs.  He is currently on Dilaudid PCA and Toradol.  On IV fluids.  Objective: Vital signs in last 24 hours: Temp:  [98.8 F (37.1 C)-98.9 F (37.2 C)] 98.8 F (37.1 C) (06/03 0414) Pulse Rate:  [68-100] 100 (06/03 0414) Resp:  [10-21] 16 (06/03 0737) BP: (106-123)/(62-90) 118/75 (06/03 0414) SpO2:  [95 %-100 %] 99 % (06/03 0737) Weight:  [57.6 kg] 57.6 kg (06/03 0042) Weight change:     Intake/Output from previous day: 06/02 0701 - 06/03 0700 In: 1421.7 [I.V.:1421.7] Out: 400 [Urine:400] Intake/Output this shift: Total I/O In: -  Out: 475 [Urine:475]  General appearance: alert, cooperative, appears stated age and no distress Head: Normocephalic, without obvious abnormality, atraumatic Neck: no adenopathy, no carotid bruit, no JVD, supple, symmetrical, trachea midline and thyroid not enlarged, symmetric, no tenderness/mass/nodules Back: symmetric, no curvature. ROM normal. No CVA tenderness. Resp: clear to auscultation bilaterally Cardio: regular rate and rhythm, S1, S2 normal, no murmur, click, rub or gallop GI: soft, non-tender; bowel sounds normal; no masses,  no organomegaly Extremities: extremities normal, atraumatic, no cyanosis or edema Pulses: 2+ and symmetric Neurologic: Grossly normal  Lab Results: Recent Labs    12/13/20 0755 12/13/20 1539  WBC 19.5* 18.9*  HGB 10.2* 9.5*  HCT 28.2* 26.8*  PLT 569* 484*   BMET Recent Labs    12/13/20 0755 12/13/20 1539 12/14/20 0307  NA 139  --  134*  K 3.6  --  3.9  CL 106  --  102  CO2 24  --  26  GLUCOSE 103*  --  109*  BUN 10  --  8  CREATININE 0.72 0.65 0.66  CALCIUM 9.3  --  8.5*    Studies/Results: NM Pulmonary Perfusion  Result Date: 12/13/2020 CLINICAL DATA:  Suspected. EXAM: NUCLEAR  MEDICINE PERFUSION LUNG SCAN TECHNIQUE: Perfusion images were obtained in multiple projections after intravenous injection of radiopharmaceutical. Ventilation scans intentionally deferred if perfusion scan and chest x-ray adequate for interpretation during COVID 19 epidemic. RADIOPHARMACEUTICALS:  4.4 mCi Tc-52m MAA IV COMPARISON:  Chest x-ray 12/13/2020. FINDINGS: No significant perfusion defects noted. No evidence of pulmonary embolus. IMPRESSION: No evidence of pulmonary embolus. Electronically Signed   By: Maisie Fus  Register   On: 12/13/2020 11:21   DG Chest Portable 1 View  Result Date: 12/13/2020 CLINICAL DATA:  Chest pain.  Sickle cell disease. EXAM: PORTABLE CHEST 1 VIEW COMPARISON:  October 28, 2020 FINDINGS: Lungs are clear. Heart size and pulmonary vascularity are normal. No adenopathy. Sclerotic foci in several bony structures consistent with known sickle cell disease. Multiple endplate infarcts in the thoracic spine are stable. IMPRESSION: Lungs clear. Cardiac silhouette within normal limits. Scattered bony changes indicative of sickle cell disease. Multiple endplate infarcts in the thoracic spine consistent with sickle cell disease. Electronically Signed   By: Bretta Bang III M.D.   On: 12/13/2020 09:03    Medications: I have reviewed the patient's current medications.  Assessment/Plan: A 20 year old gentleman with sickle cell painful crisis and COVID-19 positive  #1 sickle cell painful crisis: Patient will be maintained on Dilaudid PCA Toradol and IV fluids.  If pain persists will restart home regimen of oral oxycodone and continue to monitor.  #2 sickle cell anemia: H&H at baseline.  Continue to monitor  #3 COVID-19  positive: Incidental findings.  Largely asymptomatic.  Supportive care only.  #4 dehydration: Hydrate and monitor patient.  #5 leukocytosis: Secondary to vaso-occlusive crisis and possible COVID infection.   LOS: 1 day   Chaka Jefferys,LAWAL 12/14/2020, 8:51 AM

## 2020-12-14 NOTE — Plan of Care (Signed)
  Problem: Education: Goal: Knowledge of General Education information will improve Description: Including pain rating scale, medication(s)/side effects and non-pharmacologic comfort measures Outcome: Progressing   Problem: Pain Managment: Goal: General experience of comfort will improve Outcome: Progressing   

## 2020-12-15 MED ORDER — OXYCODONE HCL 5 MG PO TABS
10.0000 mg | ORAL_TABLET | Freq: Four times a day (QID) | ORAL | Status: DC | PRN
  Administered 2020-12-15 – 2020-12-16 (×5): 10 mg via ORAL
  Filled 2020-12-15 (×5): qty 2

## 2020-12-15 NOTE — Progress Notes (Signed)
Subjective: Patient continues to have 8 out of 10 pain mainly in the back.  His x-ray showed multiple infarcts in the thoracic and lumbar spine.  Currently on Dilaudid PCA and Toradol.  Use about 34 mg of the Dilaudid with no significant rectal sounds.  No fever or chills.  No respiratory symptoms still.  Mainly mild fever with temp of 99.8.  Objective: Vital signs in last 24 hours: Temp:  [98.3 F (36.8 C)-99.8 F (37.7 C)] 98.8 F (37.1 C) (06/04 1137) Pulse Rate:  [86-103] 103 (06/04 0810) Resp:  [9-21] 14 (06/04 1625) BP: (109-115)/(59-76) 113/76 (06/04 0500) SpO2:  [98 %-100 %] 99 % (06/04 1625) Weight change:     Intake/Output from previous day: 06/03 0701 - 06/04 0700 In: 2903.7 [P.O.:480; I.V.:2423.7] Out: 1275 [Urine:1275] Intake/Output this shift: Total I/O In: 240 [P.O.:240] Out: 1000 [Urine:1000]  General appearance: alert, cooperative, appears stated age and no distress Head: Normocephalic, without obvious abnormality, atraumatic Neck: no adenopathy, no carotid bruit, no JVD, supple, symmetrical, trachea midline and thyroid not enlarged, symmetric, no tenderness/mass/nodules Back: symmetric, no curvature. ROM normal. No CVA tenderness. Resp: clear to auscultation bilaterally Cardio: regular rate and rhythm, S1, S2 normal, no murmur, click, rub or gallop GI: soft, non-tender; bowel sounds normal; no masses,  no organomegaly Extremities: extremities normal, atraumatic, no cyanosis or edema Pulses: 2+ and symmetric Neurologic: Grossly normal  Lab Results: Recent Labs    12/13/20 1539 12/14/20 0906  WBC 18.9* 10.4  HGB 9.5* 9.1*  HCT 26.8* 25.5*  PLT 484* 390   BMET Recent Labs    12/13/20 0755 12/13/20 1539 12/14/20 0307  NA 139  --  134*  K 3.6  --  3.9  CL 106  --  102  CO2 24  --  26  GLUCOSE 103*  --  109*  BUN 10  --  8  CREATININE 0.72 0.65 0.66  CALCIUM 9.3  --  8.5*    Studies/Results: No results found.  Medications: I have reviewed  the patient's current medications.  Assessment/Plan: A 20 year old gentleman with sickle cell painful crisis and COVID-19 positive  #1 sickle cell painful crisis: Patient will be maintained on Dilaudid PCA Toradol and IV fluids.  We will also restart oxycodone 10 mg every 4 hours as needed to sustain his pain.  #2 sickle cell anemia: H&H at 9.1 which is close to his baseline.  Continue to monitor  #3 COVID-19 positive: Incidental findings.  Largely asymptomatic.  Supportive care only.  #4 dehydration: Hydrate and monitor patient.  #5 leukocytosis: Resolved.  Secondary to vaso-occlusive crisis and possible COVID infection.   LOS: 2 days   Braydin Aloi,LAWAL 12/15/2020, 4:28 PM

## 2020-12-16 DIAGNOSIS — E876 Hypokalemia: Secondary | ICD-10-CM | POA: Diagnosis present

## 2020-12-16 LAB — COMPREHENSIVE METABOLIC PANEL
ALT: 10 U/L (ref 0–44)
AST: 15 U/L (ref 15–41)
Albumin: 3.3 g/dL — ABNORMAL LOW (ref 3.5–5.0)
Alkaline Phosphatase: 79 U/L (ref 38–126)
Anion gap: 7 (ref 5–15)
BUN: 6 mg/dL (ref 6–20)
CO2: 29 mmol/L (ref 22–32)
Calcium: 8.4 mg/dL — ABNORMAL LOW (ref 8.9–10.3)
Chloride: 98 mmol/L (ref 98–111)
Creatinine, Ser: 0.7 mg/dL (ref 0.61–1.24)
GFR, Estimated: >60 mL/min (ref >60)
Glucose, Bld: 95 mg/dL (ref 70–99)
Potassium: 3.3 mmol/L — ABNORMAL LOW (ref 3.5–5.1)
Sodium: 134 mmol/L — ABNORMAL LOW (ref 135–145)
Total Bilirubin: 3.2 mg/dL — ABNORMAL HIGH (ref 0.3–1.2)
Total Protein: 6.6 g/dL (ref 6.5–8.1)

## 2020-12-16 LAB — CBC WITH DIFFERENTIAL/PLATELET
Abs Immature Granulocytes: 0.04 10*3/uL (ref 0.00–0.07)
Basophils Absolute: 0 10*3/uL (ref 0.0–0.1)
Basophils Relative: 0 %
Eosinophils Absolute: 0.2 10*3/uL (ref 0.0–0.5)
Eosinophils Relative: 2 %
HCT: 21.9 % — ABNORMAL LOW (ref 39.0–52.0)
Hemoglobin: 7.7 g/dL — ABNORMAL LOW (ref 13.0–17.0)
Immature Granulocytes: 0 %
Lymphocytes Relative: 16 %
Lymphs Abs: 1.7 10*3/uL (ref 0.7–4.0)
MCH: 31.2 pg (ref 26.0–34.0)
MCHC: 35.2 g/dL (ref 30.0–36.0)
MCV: 88.7 fL (ref 80.0–100.0)
Monocytes Absolute: 1.9 10*3/uL — ABNORMAL HIGH (ref 0.1–1.0)
Monocytes Relative: 17 %
Neutro Abs: 7 10*3/uL (ref 1.7–7.7)
Neutrophils Relative %: 65 %
Platelets: 340 10*3/uL (ref 150–400)
RBC: 2.47 MIL/uL — ABNORMAL LOW (ref 4.22–5.81)
RDW: 17.3 % — ABNORMAL HIGH (ref 11.5–15.5)
WBC: 10.8 10*3/uL — ABNORMAL HIGH (ref 4.0–10.5)
nRBC: 2.7 % — ABNORMAL HIGH (ref 0.0–0.2)

## 2020-12-16 MED ORDER — KETOROLAC TROMETHAMINE 10 MG PO TABS: 10.0000 mg | ORAL_TABLET | Freq: Four times a day (QID) | ORAL | 0 refills | Status: AC | PRN

## 2020-12-16 MED ORDER — HYDROCODONE-ACETAMINOPHEN 5-325 MG PO TABS: 1.0000 | ORAL_TABLET | Freq: Three times a day (TID) | ORAL | 0 refills | Status: AC | PRN

## 2020-12-16 MED ORDER — POTASSIUM CHLORIDE CRYS ER 20 MEQ PO TBCR
40.0000 meq | EXTENDED_RELEASE_TABLET | Freq: Two times a day (BID) | ORAL | Status: DC
  Administered 2020-12-16: 40 meq via ORAL
  Filled 2020-12-16: qty 2

## 2020-12-16 NOTE — Discharge Summary (Signed)
Physician Discharge Summary  Patient ID: Matthew Frederick MRN: [id number] DOB/AGE: [birth year] 20 y.o.  Admit date: 12/13/2020 Discharge date: 12/16/2020  Admission Diagnoses:  Discharge Diagnoses:  Principal Problem:   Sickle cell anemia with crisis [facility]) Active Problems:   Chronic pain syndrome   COVID-19 virus infection   Sickle cell disease with crisis (HCC)   Hypokalemia   Discharged Condition: good  Hospital Course: Patient is a 20 year old gentleman with history of sickle cell disease and chronic pain syndrome due to sickle cell who was admitted with sickle cell painful crisis and incidentally found to be COVID-19 positive.  Patient was treated with IV Dilaudid PCA, Toradol and oral oxycodone.  He was asymptomatic with the COVID-19 so no actual treatment was given to was that.  He was treated symptomatically mentally.  He gradually improved.  No other medical issues.  Patient subsequently discharged home to follow-up with PCP.  Consults: None  Significant Diagnostic Studies: labs: Serial CBCs and CMP is checked.  No transfusion required  Treatments: IV hydration and analgesia: Dilaudid  Discharge Exam: Blood pressure 140/66, pulse 91, temperature 97.9 F (36.6 C), temperature source Oral, resp. rate 16, height 5\' 10"  (1.778 m), weight 57.6 kg, SpO2 100 %. General appearance: alert, cooperative, appears stated age and no distress Neck: no adenopathy, no carotid bruit, no JVD, supple, symmetrical, trachea midline and thyroid not enlarged, symmetric, no tenderness/mass/nodules Cardio: regular rate and rhythm, S1, S2 normal, no murmur, click, rub or gallop GI: soft, non-tender; bowel sounds normal; no masses,  no organomegaly Extremities: extremities normal, atraumatic, no cyanosis or edema Pulses: 2+ and symmetric Skin: Skin color, texture, turgor normal. No rashes or lesions Neurologic: Grossly normal  Disposition: Discharge disposition: 01-Home or Self  Care       Discharge Instructions    Diet - low sodium heart healthy   Complete by: As directed    Increase activity slowly   Complete by: As directed      Allergies as of 12/16/2020      Reactions   Omnipaque [iohexol] Itching      Medication List    TAKE these medications   acetaminophen 500 MG tablet Commonly known as: TYLENOL Take 2 tablets (1,000 mg total) by mouth every 6 (six) hours as needed for headache.   folic acid 1 MG tablet Commonly known as: FOLVITE Take 1 mg by mouth at bedtime.   HYDROcodone-acetaminophen 5-325 MG tablet Commonly known as: NORCO/VICODIN Take 1-2 tablets by mouth every 8 (eight) hours as needed for moderate pain.   hydroxyurea 500 MG capsule Commonly known as: HYDREA Take 1,500 mg by mouth at bedtime.   ketorolac 10 MG tablet Commonly known as: TORADOL Take 1 tablet (10 mg total) by mouth every 6 (six) hours as needed for moderate pain.   naproxen sodium 220 MG tablet Commonly known as: ALEVE Take 440 mg by mouth daily as needed (pain).   Oxycodone HCl 10 MG Tabs Take 1 tablet (10 mg total) by mouth every 4 (four) hours as needed.        Signed8/11/2020 12/16/2020, 11:43 AM   Time spent 32 minutes

## 2020-12-16 NOTE — Plan of Care (Signed)
  Problem: Pain Managment: Goal: General experience of comfort will improve Outcome: Adequate for Discharge   Problem: Clinical Measurements: Goal: Respiratory complications will improve Outcome: Adequate for Discharge

## 2020-12-17 ENCOUNTER — Telehealth: Payer: Self-pay | Admitting: *Deleted

## 2020-12-17 NOTE — Telephone Encounter (Signed)
Transition Care Management Unsuccessful Follow-up Telephone Call  Date of discharge and from where:  12/16/2020 - [facility]  Attempts:  1st Attempt  Reason for unsuccessful TCM follow-up call:  Left voice message

## 2020-12-18 NOTE — Telephone Encounter (Signed)
Transition Care Management Follow-up Telephone Call  Date of discharge and from where: 12/16/2020 - [facility]  How have you been since you were released from the hospital? "Fine"  Any questions or concerns? No  Items Reviewed:  Did the pt receive and understand the discharge instructions provided? Yes   Medications obtained and verified? Yes   Other? No   Any new allergies since your discharge? No   Dietary orders reviewed? No  Do you have support at home? Yes    Functional Questionnaire: (I = Independent and D = Dependent) ADLs: I  Bathing/Dressing- I  Meal Prep- I  Eating- I  Maintaining continence- I  Transferring/Ambulation- I  Managing Meds- I  Follow up appointments reviewed:   PCP Hospital f/u appt confirmed? No  - PCP not a [facility] provider  Specialist Hospital f/u appt confirmed? No    Are transportation arrangements needed? No   If their condition worsens, is the pt aware to call PCP or go to the Emergency Dept.? Yes  Was the patient provided with contact information for the PCP's office or ED? Yes  Was to pt encouraged to call back with questions or concerns? Yes

## 2021-05-28 IMAGING — CT CT HEAD W/O CM
3 of 4 series · 15 of 47 positions shown, 18 images · non-contrast
Comparison: No priors.

CLINICAL DATA: 19-year-old male with history of headache for the
past 2 days.

EXAM:
CT HEAD WITHOUT CONTRAST
TECHNIQUE: Contiguous axial images were obtained from the base of the skull
through the vertex without intravenous contrast.

[Series 2: head wo · axial · 0.47mm/px · z∈[-127,-7]mm · 9 of 30 slices shown, 12 images]
[im 3/30  brain]
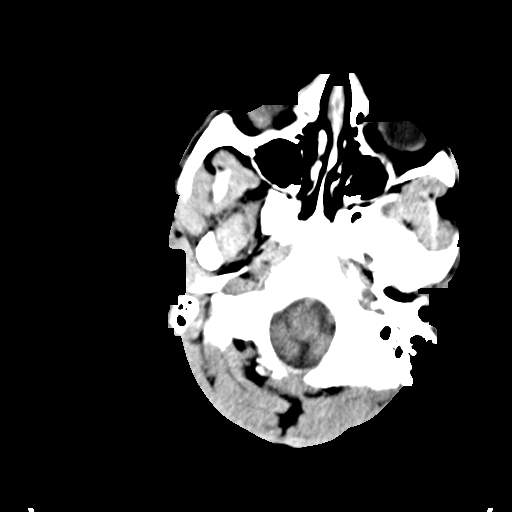
[im 3/30  bone]
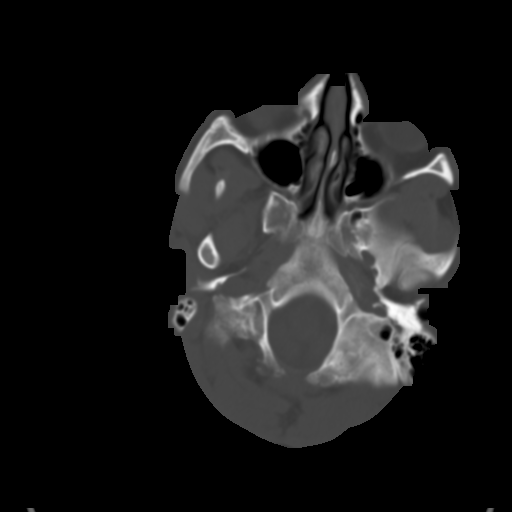
[im 7/30  brain]
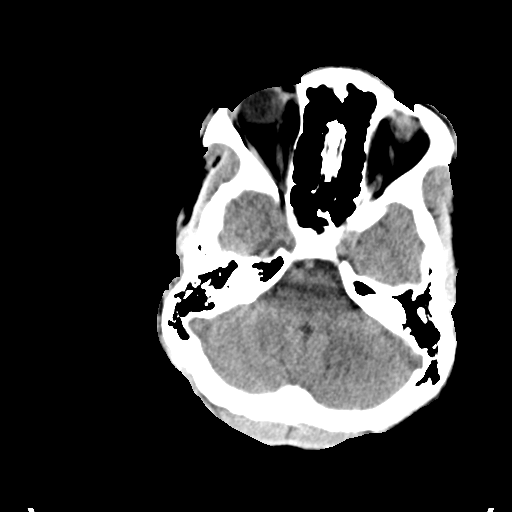
[im 9/30  brain]
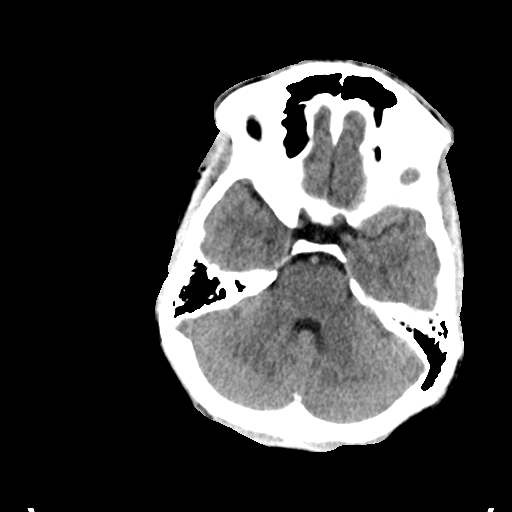
[im 13/30  brain]
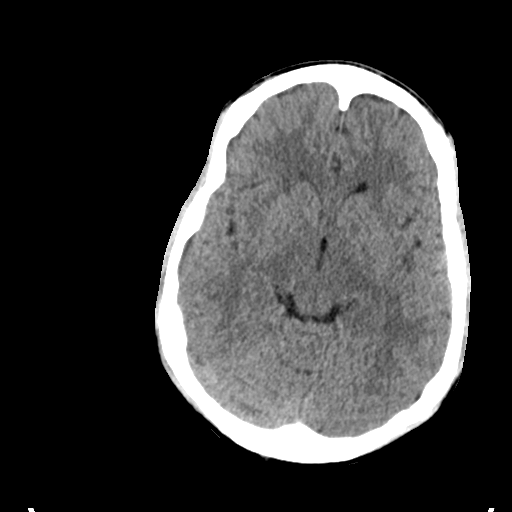
[im 15/30  brain]
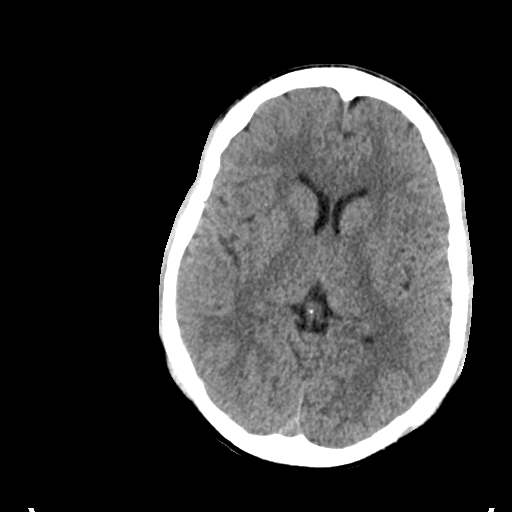
[im 15/30  bone]
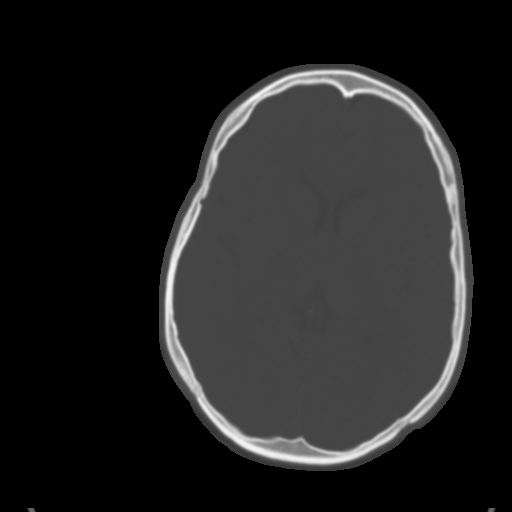
[im 17/30  brain]
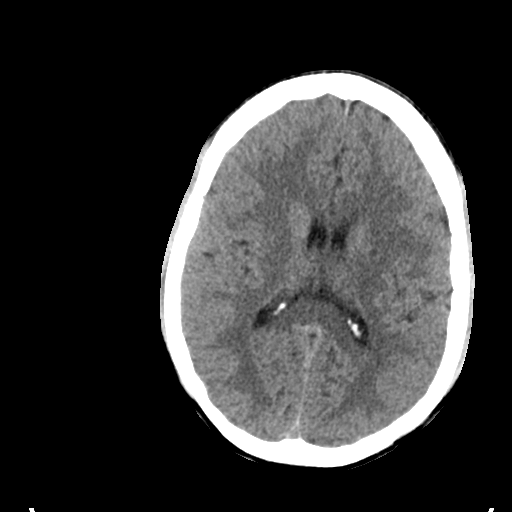
[im 21/30  brain]
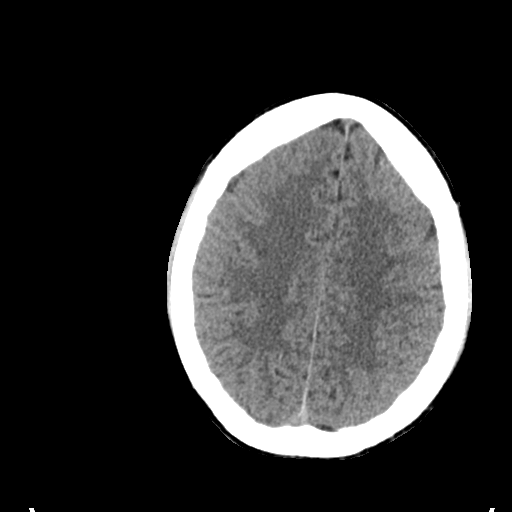
[im 23/30  brain]
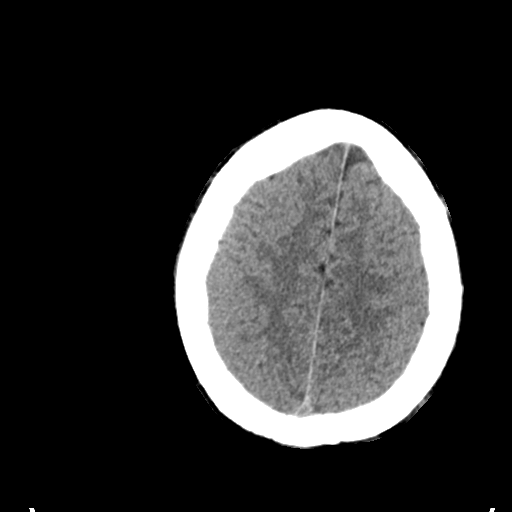
[im 27/30  brain]
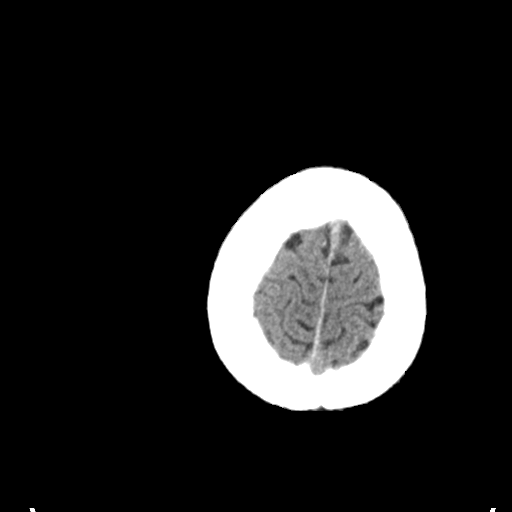
[im 27/30  bone]
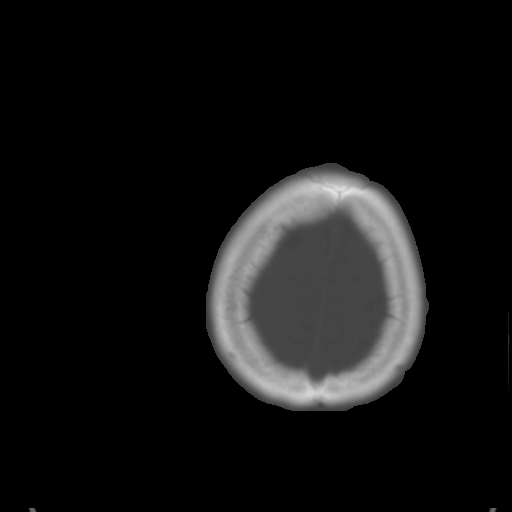

[Series 4: coronal soft tissue · coronal · 0.32mm/px · 3 of 68 slices shown]
[im 23/68  brain]
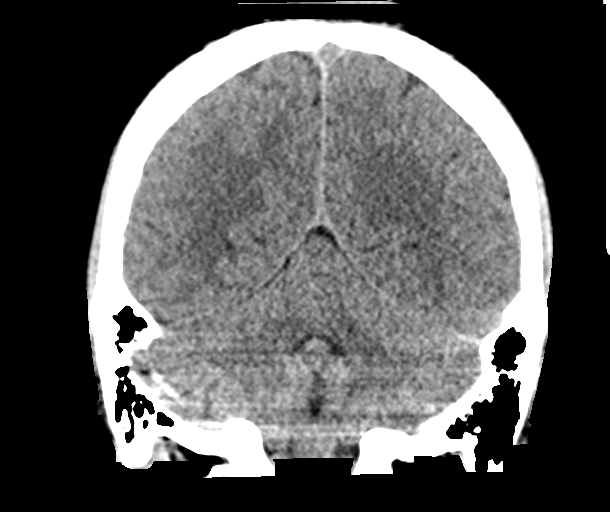
[im 30/68  brain]
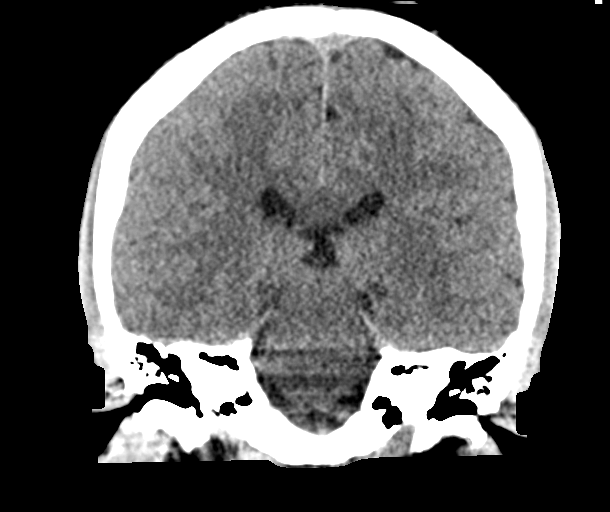
[im 38/68  brain]
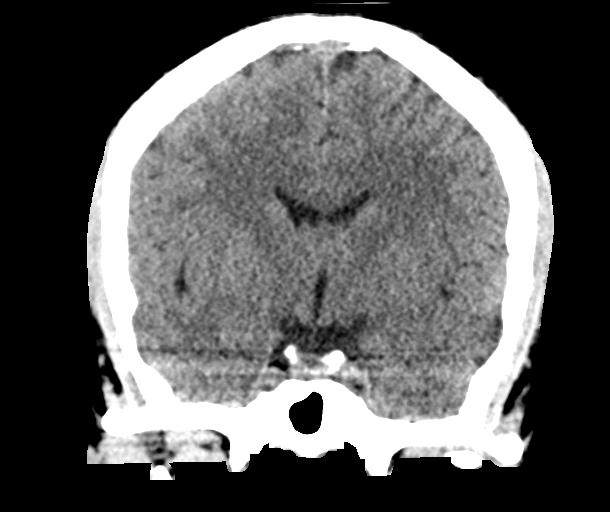

[Series 5: sagittal soft tissue · sagittal · 0.33mm/px · 3 of 61 slices shown]
[im 21/61  brain]
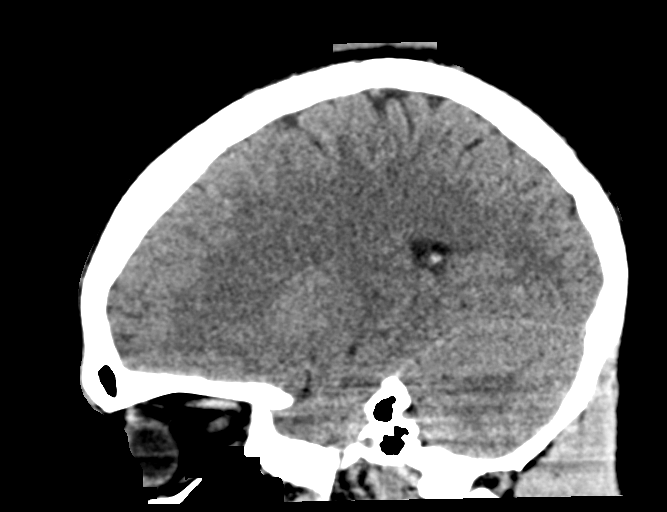
[im 31/61  brain]
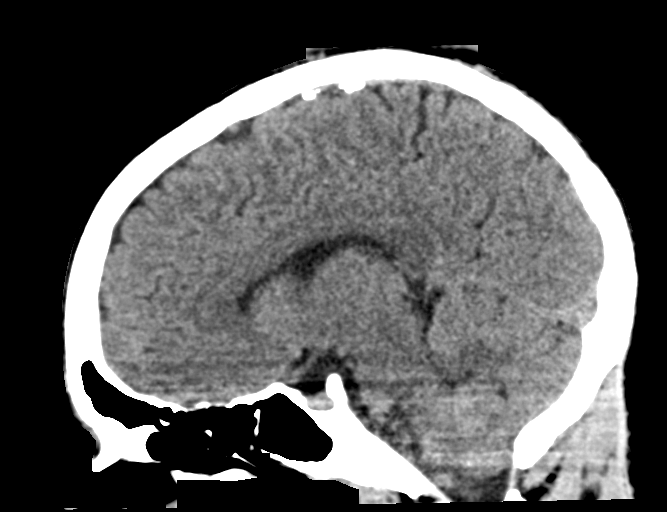
[im 41/61  brain]
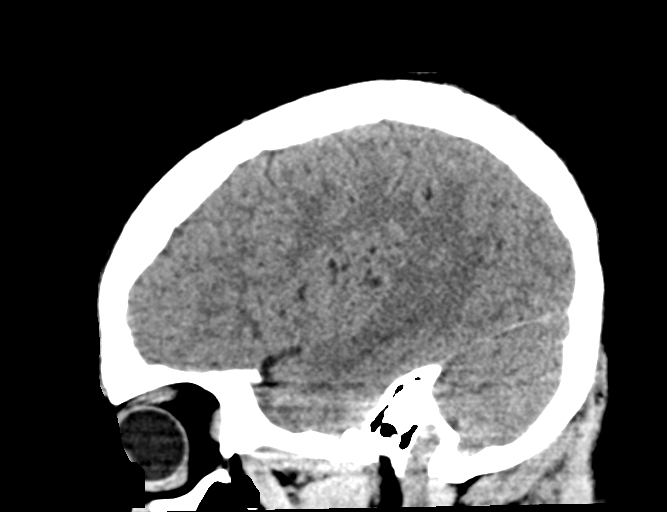

[15 of 47 positions shown; findings below may reference images not displayed]

FINDINGS: Brain: No evidence of acute infarction, hemorrhage, hydrocephalus,
extra-axial collection or mass lesion/mass effect.

Vascular: No hyperdense vessel or unexpected calcification.

Skull: Normal. Negative for fracture or focal lesion.

Sinuses/Orbits: No acute finding.

Other: None.
IMPRESSION: 1. No acute intracranial abnormalities. The appearance of the brain
is normal.
